# Patient Record
Sex: Female | Born: 1945
Health system: Southern US, Community
[De-identification: ages and names within clinical notes are randomized; demographics above are authoritative.]

## PROBLEM LIST (undated history)

## (undated) DIAGNOSIS — K219 Gastro-esophageal reflux disease without esophagitis: Secondary | ICD-10-CM

## (undated) DIAGNOSIS — F419 Anxiety disorder, unspecified: Secondary | ICD-10-CM

## (undated) DIAGNOSIS — I219 Acute myocardial infarction, unspecified: Secondary | ICD-10-CM

## (undated) DIAGNOSIS — H269 Unspecified cataract: Secondary | ICD-10-CM

## (undated) DIAGNOSIS — Z9289 Personal history of other medical treatment: Secondary | ICD-10-CM

## (undated) DIAGNOSIS — M1990 Unspecified osteoarthritis, unspecified site: Secondary | ICD-10-CM

## (undated) DIAGNOSIS — F329 Major depressive disorder, single episode, unspecified: Secondary | ICD-10-CM

## (undated) DIAGNOSIS — F32A Depression, unspecified: Secondary | ICD-10-CM

## (undated) DIAGNOSIS — E785 Hyperlipidemia, unspecified: Secondary | ICD-10-CM

## (undated) DIAGNOSIS — R079 Chest pain, unspecified: Secondary | ICD-10-CM

## (undated) DIAGNOSIS — D649 Anemia, unspecified: Secondary | ICD-10-CM

## (undated) DIAGNOSIS — I2510 Atherosclerotic heart disease of native coronary artery without angina pectoris: Secondary | ICD-10-CM

## (undated) HISTORY — DX: Hyperlipidemia, unspecified: E78.5

## (undated) HISTORY — DX: Gastro-esophageal reflux disease without esophagitis: K21.9

## (undated) HISTORY — PX: APPENDECTOMY: SHX54

## (undated) HISTORY — DX: Depression, unspecified: F32.A

## (undated) HISTORY — DX: Anxiety disorder, unspecified: F41.9

## (undated) HISTORY — DX: Unspecified osteoarthritis, unspecified site: M19.90

## (undated) HISTORY — DX: Chest pain, unspecified: R07.9

## (undated) HISTORY — DX: Major depressive disorder, single episode, unspecified: F32.9

## (undated) HISTORY — DX: Unspecified cataract: H26.9

## (undated) HISTORY — PX: CATARACT EXTRACTION, BILATERAL: SHX1313

## (undated) SURGICAL SUPPLY — 2 items
WATCHMAN FLX PRO PROCEDURE (KITS) ×1 IMPLANT
WATCHMAN TRUSTEER PROCEDURE (KITS) ×1 IMPLANT

---

## 1971-08-11 HISTORY — PX: VAGINAL HYSTERECTOMY: SUR661

## 2000-12-16 ENCOUNTER — Encounter: Payer: Self-pay | Admitting: Family Medicine

## 2000-12-16 ENCOUNTER — Encounter: Admission: RE | Admit: 2000-12-16 | Discharge: 2000-12-16 | Payer: Self-pay | Admitting: Family Medicine

## 2003-09-26 ENCOUNTER — Encounter: Admission: RE | Admit: 2003-09-26 | Discharge: 2003-09-26 | Payer: Self-pay | Admitting: Family Medicine

## 2004-09-26 ENCOUNTER — Encounter: Admission: RE | Admit: 2004-09-26 | Discharge: 2004-09-26 | Payer: Self-pay | Admitting: Family Medicine

## 2004-12-15 ENCOUNTER — Encounter: Admission: RE | Admit: 2004-12-15 | Discharge: 2004-12-15 | Payer: Self-pay | Admitting: Gastroenterology

## 2014-02-06 ENCOUNTER — Other Ambulatory Visit: Payer: Self-pay | Admitting: Family Medicine

## 2014-02-06 DIAGNOSIS — R1011 Right upper quadrant pain: Secondary | ICD-10-CM

## 2014-02-06 DIAGNOSIS — R110 Nausea: Secondary | ICD-10-CM

## 2014-02-12 ENCOUNTER — Ambulatory Visit
Admission: RE | Admit: 2014-02-12 | Discharge: 2014-02-12 | Disposition: A | Discharge status: Home / Self Care | Payer: Medicare Other | Source: Ambulatory Visit | Attending: Family Medicine | Admitting: Family Medicine

## 2014-02-12 DIAGNOSIS — R1011 Right upper quadrant pain: Secondary | ICD-10-CM

## 2014-02-12 DIAGNOSIS — R110 Nausea: Secondary | ICD-10-CM

## 2015-08-11 HISTORY — PX: COLONOSCOPY: SHX174

## 2015-10-25 ENCOUNTER — Ambulatory Visit (INDEPENDENT_AMBULATORY_CARE_PROVIDER_SITE_OTHER): Payer: Medicare Other | Admitting: Cardiology

## 2015-10-25 ENCOUNTER — Encounter: Payer: Self-pay | Admitting: Cardiology

## 2015-10-25 VITALS — BP 124/78 | HR 75 | Ht 63.0 in | Wt 134.0 lb

## 2015-10-25 DIAGNOSIS — R002 Palpitations: Secondary | ICD-10-CM

## 2015-10-25 DIAGNOSIS — R079 Chest pain, unspecified: Secondary | ICD-10-CM | POA: Diagnosis not present

## 2015-10-25 DIAGNOSIS — E785 Hyperlipidemia, unspecified: Secondary | ICD-10-CM | POA: Diagnosis not present

## 2015-10-25 HISTORY — DX: Hyperlipidemia, unspecified: E78.5

## 2015-10-25 NOTE — Progress Notes (Signed)
Cardiology Office Note   Date:  10/25/2015   ID:  Sarah Pacheco, DOB 11/04/1945, MRN [id number]  PCP:  No primary care provider on file.    Chief Complaint  Patient presents with  . Chest Pain      History of Present Illness: Sarah Pacheco is a 70 y.o. female who presents for evaluation of chest pain.  She has a history of hyperlipidemia (last LDL 257), depression and arthritis.  She saw her PCP and EKG showed NSR with no ST changes.  She is now referred for cardiac evaluation.  She says that the pain is a heaviness in the mid sternal area that is sporadic that typical lasts a few seconds and usually she continues her activity and it goes away.  She has had this about 3 times in the last few months.  It is usually nonexertional.  She says that she will feel like she needs to get a deeper breath when it occurs.  She denies any DOE and walks 5 miles daily without any chest pressure or SOB.  She occasionally will have pain in her arms but not associated with the pressure in her chest and usually movement makes the shoulder and arm pain worse.  She denies and nausea or diaphoresis with the pain.  She denies any LE edema or edema.  She occaisonally has some skipped heart beats.  These occur on a daily basis and she can feel it in her throat.  It feels like her heart is "rolling" and she has to clear her throat.      Past Medical History  Diagnosis Date  . Anxiety   . Depression   . Hyperlipidemia   . Chest pain   . Arthritis     History reviewed. No pertinent past surgical history.   Current Outpatient Prescriptions  Medication Sig Dispense Refill  . buPROPion (WELLBUTRIN SR) 150 MG 12 hr tablet Take 150 mg by mouth daily.    Marland Kitchen. estradiol (ESTRACE) 0.5 MG tablet Take 0.5 mg by mouth daily.    . fluticasone (FLONASE) 50 MCG/ACT nasal spray Place 1 spray into the nose as directed.    . meloxicam (MOBIC) 15 MG tablet Take 15 mg by mouth daily.    Marland Kitchen. PARoxetine  (PAXIL) 10 MG tablet Take 10 mg by mouth daily.    . simvastatin (ZOCOR) 40 MG tablet Take 40 mg by mouth daily.     No current facility-administered medications for this visit.    Allergies:   Atorvastatin; Codeine; Erythromycin base; and Penicillin g    Social History:  The patient  reports that she has quit smoking. She does not have any smokeless tobacco history on file. She reports that she does not drink alcohol or use illicit drugs.   Family History:  The patient's family history includes COPD in her brother; Heart attack in her sister; Heart attack (age of onset: 4659) in her brother; Heart attack (age of onset: 5072) in her father; Heart disease in her brother, father, sister, and sister; Heart failure in her brother; Lung cancer in her brother.    ROS:  Please see the history of present illness.   Otherwise, review of systems are positive for none.   All other systems are reviewed and negative.    PHYSICAL EXAM: VS:  BP 124/78 mmHg  Pulse 75  Ht 5\' 3"  (1.6 m)  Wt  134 lb (60.782 kg)  BMI 23.74 kg/m2 , BMI Body mass index is 23.74 kg/(m^2). GEN: Well nourished, well developed, in no acute distress HEENT: normal Neck: no JVD, carotid bruits, or masses Cardiac: RRR; no murmurs, rubs, or gallops,no edema  Respiratory:  clear to auscultation bilaterally, normal work of breathing GI: soft, nontender, nondistended, + BS MS: no deformity or atrophy Skin: warm and dry, no rash Neuro:  Strength and sensation are intact Psych: euthymic mood, full affect   EKG:  EKG is ordered today. The ekg ordered today demonstrates    Recent Labs: No results found for requested labs within last 365 days.    Lipid Panel No results found for: CHOL, TRIG, HDL, CHOLHDL, VLDL, LDLCALC, LDLDIRECT    Wt Readings from Last 3 Encounters:  10/25/15 134 lb (60.782 kg)       ASSESSMENT AND PLAN:  1.  Chest pain that is atypical and typical in presentation.  The pain is a pressure sensation but  only occurs with rest and only lasts a few seconds.  Her EKG is nonischemic.  Of concern, though, is that she has a very high cholesterol with a recent LDL of 257 and a very strong family history of CAD with her dad and several siblings with MI. I will get a nuclear stress test.   2.  Hyperlipidemia with severely elevated LDL.  Needs aggressive treatment given risk factors.  Continue Zocor. 3.  Palpitations.  I will get an event monitor to assess for arrythmias.    Current medicines are reviewed at length with the patient today.  The patient does not have concerns regarding medicines.  The following changes have been made:  no change  Labs/ tests ordered today: See above Assessment and Plan No orders of the defined types were placed in this encounter.     Disposition:   FU with me in 1 year Signed, Quintella Reichert, MD  10/25/2015 1:44 PM    [facility] [address] [place], [place], [place]  [address] Phone: [phone]; Fax: [phone]

## 2015-10-25 NOTE — Patient Instructions (Signed)
Medication Instructions:  Your physician recommends that you continue on your current medications as directed. Please refer to the Current Medication list given to you today.   Labwork: None  Testing/Procedures: Dr. Mayford Knifeurner recommends you have a NUCLEAR STRESS TEST.  Your physician has recommended that you wear an event monitor. Event monitors are medical devices that record the heart's electrical activity. Doctors most often us these monitors to diagnose arrhythmias. Arrhythmias are problems with the speed or rhythm of the heartbeat. The monitor is a small, portable device. You can wear one while you do your normal daily activities. This is usually used to diagnose what is causing palpitations/syncope (passing out).  Follow-Up: Your physician wants you to follow-up in: 1 year with Dr. Mayford Knifeurner. You will receive a reminder letter in the mail two months in advance. If you don't receive a letter, please call our office to schedule the follow-up appointment.   Any Other Special Instructions Will Be Listed Below (If Applicable).     If you need a refill on your cardiac medications before your next appointment, please call your pharmacy.

## 2015-11-05 ENCOUNTER — Encounter (HOSPITAL_COMMUNITY): Payer: Medicare Other

## 2018-07-11 ENCOUNTER — Encounter: Payer: Self-pay | Admitting: Gastroenterology

## 2018-07-15 ENCOUNTER — Encounter: Payer: Self-pay | Admitting: Gastroenterology

## 2018-07-15 ENCOUNTER — Ambulatory Visit: Payer: Medicare Other | Admitting: Gastroenterology

## 2018-07-15 VITALS — BP 144/88 | HR 68 | Ht 63.0 in | Wt 151.0 lb

## 2018-07-15 DIAGNOSIS — Z8619 Personal history of other infectious and parasitic diseases: Secondary | ICD-10-CM

## 2018-07-15 DIAGNOSIS — R1013 Epigastric pain: Secondary | ICD-10-CM | POA: Diagnosis not present

## 2018-07-15 NOTE — Progress Notes (Signed)
Chief Complaint: ?  Recurrent H. Pylori/abdominal pain  Referring Provider:  No ref. provider found      ASSESSMENT AND PLAN;   #1.  Epi pain- D/d PUD, GERD, gastritis, nonulcer dyspepsia, gastroparesis, musculoskeletal etiology, r/o gallbladder or pancreatic problems. #2.  History of H. pylori positive antibodies (do not have all the records).  Discussed regarding breath test, stool antigen test and EGD.  She would prefer EGD. #3.  FH colon cancer (sis and brother) - s/p colonoscopy x2 , last colonoscopy 2017- Dr Audrie LiaMann-advised to hold off due to difficult tortuous colon per patient.  Plan: - EGD with biopsies for further evaluation. - US complete - Please obtain previous records from [facility]. - If still with problems, consider HIDA scan with ejection fraction to rule out biliary dyskinesia. - Avoid all nonsteroidals for now. - D/w patient and patient's husband in detail. - If still with problems and the above work-up is negative, proceed with CT scan abdo/pelvis.    HPI:    Sarah Pacheco is a 72 y.o. female  Intermittent epigastric postprandial pain for last 6 months. Associated with nausea but no vomiting. In course of her work-up, she was found to be positive for H. pylori antibody.  She has been given 3 courses of H. pylori treatment.  This has included antibiotics-metronidazole, levofloxacin, clindamycin, omeprazole twice a day and then 3 times a day.  She did not bring any records along.  She continues to have postprandial epigastric discomfort.  She has also been on steroids for "respiratory problems".  Denies use of nonsteroidals except Advil's.  Has been having "queasy feeling" usually after eating.  Always had problems with constipation-normally will have bowel movements at the frequency of 1-2 times per week.  This is associated with some abdominal bloating.  Does not want another colonoscopy ever.  Has been drinking plenty of liquids.    Past Medical History:    Diagnosis Date  . Anxiety   . Arthritis   . Chest pain   . Depression   . Hyperlipidemia   . Hyperlipidemia with target LDL less than 70 10/25/2015    Past Surgical History:  Procedure Laterality Date  . COLONOSCOPY  2017   colon polyps    Family History  Problem Relation Age of Onset  . Heart attack Father 4172       MI  . Heart disease Father   . Heart disease Sister   . Heart attack Brother 59       MI  . Heart attack Sister   . Heart disease Sister   . Colon cancer Sister 1565  . Heart disease Brother   . Heart failure Brother   . Colon cancer Brother 5765  . COPD Brother   . Lung cancer Brother     Social History   Tobacco Use  . Smoking status: Former Games developermoker  . Smokeless tobacco: Never Used  Substance Use Topics  . Alcohol use: No  . Drug use: No    Current Outpatient Medications  Medication Sig Dispense Refill  . estradiol (ESTRACE) 0.5 MG tablet Take 0.5 mg by mouth daily.    Marland Kitchen. PARoxetine (PAXIL) 10 MG tablet Take 10 mg by mouth daily.    . simvastatin (ZOCOR) 40 MG tablet Take 40 mg by mouth daily.     No current facility-administered medications for this visit.     Allergies  Allergen Reactions  . Atorvastatin Other (See Comments)    Cramps in legs  .  Codeine Nausea And Vomiting    unknown  . Erythromycin Base Other (See Comments)    Sick on the stomach  . Penicillin G Hives    unknown    Review of Systems:  Constitutional: Denies fever, chills, diaphoresis, appetite change and fatigue.  HEENT: Denies photophobia, eye pain, redness, hearing loss, ear pain, congestion, sore throat, rhinorrhea, sneezing, mouth sores, neck pain, neck stiffness and tinnitus.   Respiratory: Denies SOB, DOE, cough, chest tightness,  and wheezing.   Cardiovascular: Denies chest pain, palpitations and leg swelling.  Genitourinary: Denies dysuria, urgency, frequency, hematuria, flank pain and difficulty urinating.  Musculoskeletal: Denies myalgias, back pain, joint  swelling, arthralgias and gait problem.  Skin: No rash.  Neurological: Denies dizziness, seizures, syncope, weakness, light-headedness, numbness and headaches.  Hematological: Denies adenopathy. Easy bruising, personal or family bleeding history  Psychiatric/Behavioral: Has anxiety or depression     Physical Exam:    BP (!) 144/88   Pulse 68   Ht 5\' 3"  (1.6 m)   Wt 151 lb (68.5 kg)   SpO2 98%   BMI 26.75 kg/m  Filed Weights   07/15/18 1505  Weight: 151 lb (68.5 kg)   Constitutional:  Well-developed, in no acute distress. Psychiatric: Normal mood and affect. Behavior is normal. HEENT: Pupils normal.  Conjunctivae are normal. No scleral icterus. Neck supple.  Cardiovascular: Normal rate, regular rhythm. No edema Pulmonary/chest: Effort normal and breath sounds normal. No wheezing, rales or rhonchi. Abdominal: Soft, nondistended. Nontender. Bowel sounds active throughout. There are no masses palpable. No hepatomegaly. Rectal:  defered Neurological: Alert and oriented to person place and time. Skin: Skin is warm and dry. No rashes noted.    Edman Circle, MD 07/15/2018, 3:12 PM  Cc: [facility].

## 2018-07-15 NOTE — Patient Instructions (Signed)
If you are age 70 or older, your body mass index should be between 23-30. Your Body mass index is 26.75 kg/m. If this is out of the aforementioned range listed, please consider follow up with your Primary Care Provider.  If you are age 70 or younger, your body mass index should be between 19-25. Your Body mass index is 26.75 kg/m. If this is out of the aformentioned range listed, please consider follow up with your Primary Care Provider.  You have been scheduled for an abdominal [facility] at [facility]  (1st floor ) on 07/21/18 at 11am. Please arrive 15 minutes prior to your appointment for registration. Make certain not to have anything to eat or drink 6 hours prior to your appointment. Should you need to reschedule your appointment, please contact radiology at [phone]. This test typically takes about 30 minutes to perform.   You have been scheduled for an endoscopy. Please follow written instructions given to you at your visit today. If you use inhalers (even only as needed), please bring them with you on the day of your procedure. Your physician has requested that you go to www.startemmi.com and enter the access code given to you at your visit today. This web site gives a general overview about your procedure. However, you should still follow specific instructions given to you by our office regarding your preparation for the procedure.  Thank you,  Dr. Lynann Bolognaajesh Gupta

## 2018-07-21 ENCOUNTER — Encounter (HOSPITAL_BASED_OUTPATIENT_CLINIC_OR_DEPARTMENT_OTHER): Payer: Self-pay | Admitting: Radiology

## 2018-07-21 ENCOUNTER — Ambulatory Visit (HOSPITAL_BASED_OUTPATIENT_CLINIC_OR_DEPARTMENT_OTHER)
Admission: RE | Admit: 2018-07-21 | Discharge: 2018-07-21 | Disposition: A | Discharge status: Home / Self Care | Payer: Medicare Other | Source: Ambulatory Visit | Attending: Gastroenterology | Admitting: Gastroenterology

## 2018-07-21 DIAGNOSIS — R1013 Epigastric pain: Secondary | ICD-10-CM | POA: Insufficient documentation

## 2018-07-21 DIAGNOSIS — Z8619 Personal history of other infectious and parasitic diseases: Secondary | ICD-10-CM | POA: Insufficient documentation

## 2018-07-21 DIAGNOSIS — N281 Cyst of kidney, acquired: Secondary | ICD-10-CM | POA: Diagnosis not present

## 2018-08-02 ENCOUNTER — Encounter: Payer: Self-pay | Admitting: Gastroenterology

## 2018-08-16 ENCOUNTER — Encounter: Payer: Self-pay | Admitting: Gastroenterology

## 2018-08-16 ENCOUNTER — Ambulatory Visit (AMBULATORY_SURGERY_CENTER): Payer: Medicare Other | Admitting: Gastroenterology

## 2018-08-16 ENCOUNTER — Other Ambulatory Visit: Payer: Self-pay

## 2018-08-16 VITALS — BP 102/51 | HR 59 | Temp 96.6°F | Resp 13 | Ht 63.0 in | Wt 151.0 lb

## 2018-08-16 DIAGNOSIS — K2270 Barrett's esophagus without dysplasia: Secondary | ICD-10-CM | POA: Diagnosis not present

## 2018-08-16 DIAGNOSIS — K2970 Gastritis, unspecified, without bleeding: Secondary | ICD-10-CM | POA: Diagnosis not present

## 2018-08-16 DIAGNOSIS — R1013 Epigastric pain: Secondary | ICD-10-CM

## 2018-08-16 MED ORDER — SODIUM CHLORIDE 0.9 % IV SOLN: 500.0000 mL | Freq: Once | INTRAVENOUS | Status: DC

## 2018-08-16 MED ORDER — PANTOPRAZOLE SODIUM 40 MG PO TBEC: 40.0000 mg | DELAYED_RELEASE_TABLET | Freq: Every day | ORAL | 6 refills | Status: AC

## 2018-08-16 NOTE — Patient Instructions (Signed)
YOU HAD AN ENDOSCOPIC PROCEDURE TODAY AT THE [facility]:   Refer to the procedure report that was given to you for any specific questions about what was found during the examination.  If the procedure report does not answer your questions, please call your gastroenterologist to clarify.  If you requested that your care partner not be given the details of your procedure findings, then the procedure report has been included in a sealed envelope for you to review at your convenience later.  YOU SHOULD EXPECT: Some feelings of bloating in the abdomen. Passage of more gas than usual.  Walking can help get rid of the air that was put into your GI tract during the procedure and reduce the bloating.   Please Note:  You might notice some irritation and congestion in your nose or some drainage.  This is from the oxygen used during your procedure.  There is no need for concern and it should clear up in a day or so.  SYMPTOMS TO REPORT IMMEDIATELY:    Following upper endoscopy (EGD)  Vomiting of blood or coffee ground material  New chest pain or pain under the shoulder blades  Painful or persistently difficult swallowing  New shortness of breath  Fever of 100F or higher  Black, tarry-looking stools  For urgent or emergent issues, a gastroenterologist can be reached at any hour by calling [phone].   DIET:  We do recommend a small meal at first, but then you may proceed to your regular diet.  Drink plenty of fluids but you should avoid alcoholic beverages for 24 hours.  ACTIVITY:  You should plan to take it easy for the rest of today and you should NOT DRIVE or use heavy machinery until tomorrow (because of the sedation medicines used during the test).    FOLLOW UP: Our staff will call the number listed on your records the next business day following your procedure to check on you and address any questions or concerns that you may have regarding the information given to you  following your procedure. If we do not reach you, we will leave a message.  However, if you are feeling well and you are not experiencing any problems, there is no need to return our call.  We will assume that you have returned to your regular daily activities without incident.  If any biopsies were taken you will be contacted by phone or by letter within the next 1-3 weeks.  Please call us at [phone] if you have not heard about the biopsies in 3 weeks.    SIGNATURES/CONFIDENTIALITY: You and/or your care partner have signed paperwork which will be entered into your electronic medical record.  These signatures attest to the fact that that the information above on your After Visit Summary has been reviewed and is understood.  Full responsibility of the confidentiality of this discharge information lies with you and/or your care-partner.  Read all of the handouts given to you by your recovery room nure.

## 2018-08-16 NOTE — Progress Notes (Signed)
Patient informed of warning of increased risk of GI bleed displayed for taking  Mobic and Paxil.

## 2018-08-16 NOTE — Op Note (Signed)
[facility] Patient Name: Sarah Pacheco Procedure Date: 08/16/2018 9:05 AM MRN: [id number] Endoscopist: Lynann Bologna , MD Age: 73 Referring MD:  Date of Birth: 04-30-1946 Gender: Female Account #: [id number] Procedure:                Upper GI endoscopy Indications:              Epigastric abdominal pain. H/O recurrent H. pylori. Medicines:                Monitored Anesthesia Care Procedure:                Pre-Anesthesia Assessment:                           - Prior to the procedure, a History and Physical                            was performed, and patient medications and                            allergies were reviewed. The patient's tolerance of                            previous anesthesia was also reviewed. The risks                            and benefits of the procedure and the sedation                            options and risks were discussed with the patient.                            All questions were answered, and informed consent                            was obtained. Prior Anticoagulants: The patient has                            taken no previous anticoagulant or antiplatelet                            agents. ASA Grade Assessment: II - A patient with                            mild systemic disease. After reviewing the risks                            and benefits, the patient was deemed in                            satisfactory condition to undergo the procedure.                           After obtaining informed consent, the endoscope was  passed under direct vision. Throughout the                            procedure, the patient's blood pressure, pulse, and                            oxygen saturations were monitored continuously. The                            Model GIF-HQ190 [id number] scope was introduced                            through the and advanced to the. The Model                            [id number]-HQ190  [id number]) scope was introduced through                            the and advanced to the second part of duodenum.                            The upper GI endoscopy was accomplished without                            difficulty. The patient tolerated the procedure                            well. Scope In: Scope Out: Findings:                 The Z-line was irregular and was found 35 cm from                            the incisors. Healed distal esophageal erosions.                            Biopsies were taken with a cold forceps for                            histology. Estimated blood loss: none.                           Localized mild inflammation characterized by                            erythema was found in the gastric antrum. Biopsies                            were taken with a cold forceps for histology.                            Estimated blood loss: none.                           The examined duodenum was normal.  Biopsies for                            histology were taken with a cold forceps for                            evaluation of celiac disease. Estimated blood loss:                            none. Complications:            No immediate complications. Estimated Blood Loss:     Estimated blood loss: none. Impression:               - Z-line irregular, 35 cm from the incisors.                            Biopsied.                           - Mild Gastritis. Biopsied.                           - Otherwise normal EGD. Recommendation:           - Patient has a contact number available for                            emergencies. The signs and symptoms of potential                            delayed complications were discussed with the                            patient. Return to normal activities tomorrow.                            Written discharge instructions were provided to the                            patient.                           - Resume previous  diet.                           - Continue present medications.                           - Change omeprazole to Protonix 40 mg p.o. once a                            day #30, 6 refills.                           - Await pathology results.                           -  Follow-up in the GI clinic in 12 weeks. Lynann Bolognaajesh Dabid [place], MD 08/16/2018 9:28:27 AM This report has been signed electronically.

## 2018-08-16 NOTE — Progress Notes (Signed)
PT taken to PACU. Monitors in place. VSS. Report given to RN. 

## 2018-08-16 NOTE — Progress Notes (Signed)
Called to room to assist during endoscopic procedure.  Patient ID and intended procedure confirmed with present staff. Received instructions for my participation in the procedure from the performing physician.  

## 2018-08-17 ENCOUNTER — Telehealth: Payer: Self-pay

## 2018-08-17 NOTE — Telephone Encounter (Signed)
  Follow up Call-  Call back number 08/16/2018  Post procedure Call Back phone  # [phone]  Permission to leave phone message Yes  Some recent data might be hidden     Patient questions:  Do you have a fever, pain , or abdominal swelling? No. Pain Score  0 *  Have you tolerated food without any problems? Yes.    Have you been able to return to your normal activities? Yes.    Do you have any questions about your discharge instructions: Diet   No. Medications  No. Follow up visit  No.  Do you have questions or concerns about your Care? No.  Actions: * If pain score is 4 or above: No action needed, pain <4.

## 2018-08-24 ENCOUNTER — Encounter: Payer: Self-pay | Admitting: Gastroenterology

## 2018-10-10 ENCOUNTER — Emergency Department (HOSPITAL_COMMUNITY): Payer: Medicare Other

## 2018-10-10 ENCOUNTER — Inpatient Hospital Stay (HOSPITAL_COMMUNITY): Payer: Medicare Other | Admitting: Anesthesiology

## 2018-10-10 ENCOUNTER — Inpatient Hospital Stay (HOSPITAL_COMMUNITY)
Admission: EM | Admit: 2018-10-10 | Discharge: 2018-10-15 | DRG: 331 | Disposition: A | Discharge status: Home / Self Care | Payer: Medicare Other | Attending: General Surgery | Admitting: General Surgery

## 2018-10-10 ENCOUNTER — Encounter (HOSPITAL_COMMUNITY): Payer: Self-pay | Admitting: Radiology

## 2018-10-10 ENCOUNTER — Other Ambulatory Visit: Payer: Self-pay

## 2018-10-10 ENCOUNTER — Encounter (HOSPITAL_COMMUNITY): Admission: EM | Disposition: A | Discharge status: Home / Self Care | Payer: Self-pay | Source: Home / Self Care

## 2018-10-10 DIAGNOSIS — Z8719 Personal history of other diseases of the digestive system: Secondary | ICD-10-CM | POA: Diagnosis not present

## 2018-10-10 DIAGNOSIS — Z90710 Acquired absence of both cervix and uterus: Secondary | ICD-10-CM

## 2018-10-10 DIAGNOSIS — Z800 Family history of malignant neoplasm of digestive organs: Secondary | ICD-10-CM | POA: Diagnosis not present

## 2018-10-10 DIAGNOSIS — Z87891 Personal history of nicotine dependence: Secondary | ICD-10-CM | POA: Diagnosis not present

## 2018-10-10 DIAGNOSIS — R030 Elevated blood-pressure reading, without diagnosis of hypertension: Secondary | ICD-10-CM | POA: Diagnosis present

## 2018-10-10 DIAGNOSIS — K567 Ileus, unspecified: Secondary | ICD-10-CM | POA: Diagnosis present

## 2018-10-10 DIAGNOSIS — R110 Nausea: Secondary | ICD-10-CM | POA: Diagnosis not present

## 2018-10-10 DIAGNOSIS — Z883 Allergy status to other anti-infective agents status: Secondary | ICD-10-CM | POA: Diagnosis not present

## 2018-10-10 DIAGNOSIS — Z880 Allergy status to penicillin: Secondary | ICD-10-CM | POA: Diagnosis not present

## 2018-10-10 DIAGNOSIS — F419 Anxiety disorder, unspecified: Secondary | ICD-10-CM | POA: Diagnosis present

## 2018-10-10 DIAGNOSIS — K219 Gastro-esophageal reflux disease without esophagitis: Secondary | ICD-10-CM | POA: Diagnosis present

## 2018-10-10 DIAGNOSIS — Z8249 Family history of ischemic heart disease and other diseases of the circulatory system: Secondary | ICD-10-CM | POA: Diagnosis not present

## 2018-10-10 DIAGNOSIS — E785 Hyperlipidemia, unspecified: Secondary | ICD-10-CM | POA: Diagnosis present

## 2018-10-10 DIAGNOSIS — Z79899 Other long term (current) drug therapy: Secondary | ICD-10-CM | POA: Diagnosis not present

## 2018-10-10 DIAGNOSIS — Z885 Allergy status to narcotic agent status: Secondary | ICD-10-CM | POA: Diagnosis not present

## 2018-10-10 DIAGNOSIS — Z801 Family history of malignant neoplasm of trachea, bronchus and lung: Secondary | ICD-10-CM | POA: Diagnosis not present

## 2018-10-10 DIAGNOSIS — K562 Volvulus: Secondary | ICD-10-CM | POA: Diagnosis present

## 2018-10-10 DIAGNOSIS — Z825 Family history of asthma and other chronic lower respiratory diseases: Secondary | ICD-10-CM

## 2018-10-10 DIAGNOSIS — Z8371 Family history of colonic polyps: Secondary | ICD-10-CM

## 2018-10-10 HISTORY — PX: LAPAROTOMY: SHX154

## 2018-10-10 LAB — COMPREHENSIVE METABOLIC PANEL
ALT: 12 U/L (ref 0–44)
AST: 19 U/L (ref 15–41)
Albumin: 4.5 g/dL (ref 3.5–5.0)
Alkaline Phosphatase: 44 U/L (ref 38–126)
Anion gap: 8 (ref 5–15)
BILIRUBIN TOTAL: 0.4 mg/dL (ref 0.3–1.2)
BUN: 10 mg/dL (ref 8–23)
CO2: 23 mmol/L (ref 22–32)
Calcium: 9 mg/dL (ref 8.9–10.3)
Chloride: 107 mmol/L (ref 98–111)
Creatinine, Ser: 0.92 mg/dL (ref 0.44–1.00)
Glucose, Bld: 124 mg/dL — ABNORMAL HIGH (ref 70–99)
POTASSIUM: 4.3 mmol/L (ref 3.5–5.1)
Sodium: 138 mmol/L (ref 135–145)
TOTAL PROTEIN: 7.6 g/dL (ref 6.5–8.1)

## 2018-10-10 LAB — URINALYSIS, ROUTINE W REFLEX MICROSCOPIC
Bilirubin Urine: NEGATIVE
GLUCOSE, UA: NEGATIVE mg/dL
Ketones, ur: 20 mg/dL — AB
Leukocytes,Ua: NEGATIVE
NITRITE: NEGATIVE
PH: 5 (ref 5.0–8.0)
PROTEIN: 30 mg/dL — AB
Specific Gravity, Urine: 1.026 (ref 1.005–1.030)

## 2018-10-10 LAB — TYPE AND SCREEN
ABO/RH(D): A NEG
Antibody Screen: NEGATIVE

## 2018-10-10 LAB — CBC WITH DIFFERENTIAL/PLATELET
Abs Immature Granulocytes: 0.02 10*3/uL (ref 0.00–0.07)
BASOS ABS: 0.1 10*3/uL (ref 0.0–0.1)
Basophils Relative: 1 %
Eosinophils Absolute: 0 10*3/uL (ref 0.0–0.5)
Eosinophils Relative: 0 %
HCT: 41.4 % (ref 36.0–46.0)
Hemoglobin: 12.8 g/dL (ref 12.0–15.0)
Immature Granulocytes: 0 %
LYMPHS PCT: 21 %
Lymphs Abs: 1.8 10*3/uL (ref 0.7–4.0)
MCH: 29.5 pg (ref 26.0–34.0)
MCHC: 30.9 g/dL (ref 30.0–36.0)
MCV: 95.4 fL (ref 80.0–100.0)
Monocytes Absolute: 0.6 10*3/uL (ref 0.1–1.0)
Monocytes Relative: 7 %
NEUTROS ABS: 6 10*3/uL (ref 1.7–7.7)
NRBC: 0 % (ref 0.0–0.2)
Neutrophils Relative %: 71 %
Platelets: 296 10*3/uL (ref 150–400)
RBC: 4.34 MIL/uL (ref 3.87–5.11)
RDW: 13.9 % (ref 11.5–15.5)
WBC: 8.5 10*3/uL (ref 4.0–10.5)

## 2018-10-10 LAB — LIPASE, BLOOD: LIPASE: 59 U/L — AB (ref 11–51)

## 2018-10-10 LAB — ABO/RH: ABO/RH(D): A NEG

## 2018-10-10 LAB — MRSA PCR SCREENING: MRSA by PCR: NEGATIVE

## 2018-10-10 LAB — I-STAT TROPONIN, ED: Troponin i, poc: 0.05 ng/mL (ref 0.00–0.08)

## 2018-10-10 SURGERY — LAPAROTOMY, EXPLORATORY
Anesthesia: General

## 2018-10-10 MED ORDER — PANTOPRAZOLE SODIUM 40 MG PO TBEC
40.0000 mg | DELAYED_RELEASE_TABLET | Freq: Every day | ORAL | Status: DC
  Administered 2018-10-10 – 2018-10-13 (×4): 40 mg via ORAL
  Filled 2018-10-10 (×5): qty 1

## 2018-10-10 MED ORDER — HYDRALAZINE HCL 20 MG/ML IJ SOLN: 10.0000 mg | INTRAMUSCULAR | Status: DC | PRN

## 2018-10-10 MED ORDER — EPHEDRINE SULFATE-NACL 50-0.9 MG/10ML-% IV SOSY
PREFILLED_SYRINGE | INTRAVENOUS | Status: DC | PRN
  Administered 2018-10-10 (×3): 5 mg via INTRAVENOUS

## 2018-10-10 MED ORDER — METOCLOPRAMIDE HCL 5 MG/ML IJ SOLN: 10.0000 mg | Freq: Once | INTRAMUSCULAR | Status: DC | PRN

## 2018-10-10 MED ORDER — CHLORHEXIDINE GLUCONATE CLOTH 2 % EX PADS: 6.0000 | MEDICATED_PAD | Freq: Once | CUTANEOUS | Status: DC

## 2018-10-10 MED ORDER — DEXAMETHASONE SODIUM PHOSPHATE 10 MG/ML IJ SOLN
INTRAMUSCULAR | Status: AC
  Filled 2018-10-10: qty 1

## 2018-10-10 MED ORDER — KETAMINE HCL 10 MG/ML IJ SOLN
INTRAMUSCULAR | Status: AC
  Filled 2018-10-10: qty 1

## 2018-10-10 MED ORDER — MORPHINE SULFATE (PF) 4 MG/ML IV SOLN
2.0000 mg | INTRAVENOUS | Status: DC | PRN
  Administered 2018-10-10: 2 mg via INTRAVENOUS
  Filled 2018-10-10: qty 1

## 2018-10-10 MED ORDER — FENTANYL CITRATE (PF) 100 MCG/2ML IJ SOLN
INTRAMUSCULAR | Status: AC
  Filled 2018-10-10: qty 2

## 2018-10-10 MED ORDER — SODIUM CHLORIDE 0.9 % IV SOLN: INTRAVENOUS | Status: DC

## 2018-10-10 MED ORDER — HYDROMORPHONE HCL 1 MG/ML IJ SOLN
0.2500 mg | INTRAMUSCULAR | Status: DC | PRN
  Administered 2018-10-10 (×2): 0.5 mg via INTRAVENOUS

## 2018-10-10 MED ORDER — SUGAMMADEX SODIUM 200 MG/2ML IV SOLN
INTRAVENOUS | Status: AC
  Filled 2018-10-10: qty 2

## 2018-10-10 MED ORDER — GENTAMICIN SULFATE 40 MG/ML IJ SOLN
5.0000 mg/kg | Freq: Once | INTRAVENOUS | Status: AC
  Administered 2018-10-10: 290 mg via INTRAVENOUS
  Filled 2018-10-10: qty 7.25

## 2018-10-10 MED ORDER — SUCCINYLCHOLINE CHLORIDE 200 MG/10ML IV SOSY
PREFILLED_SYRINGE | INTRAVENOUS | Status: DC | PRN
  Administered 2018-10-10: 120 mg via INTRAVENOUS

## 2018-10-10 MED ORDER — EPHEDRINE 5 MG/ML INJ
INTRAVENOUS | Status: AC
  Filled 2018-10-10: qty 10

## 2018-10-10 MED ORDER — KETAMINE HCL 10 MG/ML IJ SOLN
INTRAMUSCULAR | Status: DC | PRN
  Administered 2018-10-10: 25 mg via INTRAVENOUS

## 2018-10-10 MED ORDER — FENTANYL CITRATE (PF) 100 MCG/2ML IJ SOLN
25.0000 ug | INTRAMUSCULAR | Status: DC | PRN
  Administered 2018-10-10 (×3): 50 ug via INTRAVENOUS

## 2018-10-10 MED ORDER — LACTATED RINGERS IV SOLN
INTRAVENOUS | Status: DC
  Administered 2018-10-10 (×2): via INTRAVENOUS

## 2018-10-10 MED ORDER — DEXAMETHASONE SODIUM PHOSPHATE 10 MG/ML IJ SOLN
INTRAMUSCULAR | Status: DC | PRN
  Administered 2018-10-10: 8 mg via INTRAVENOUS

## 2018-10-10 MED ORDER — SODIUM CHLORIDE 0.9 % IV SOLN: 2.0000 g | Freq: Two times a day (BID) | INTRAVENOUS | Status: DC

## 2018-10-10 MED ORDER — SODIUM CHLORIDE (PF) 0.9 % IJ SOLN
INTRAMUSCULAR | Status: AC
  Filled 2018-10-10: qty 50

## 2018-10-10 MED ORDER — ONDANSETRON HCL 4 MG/2ML IJ SOLN
INTRAMUSCULAR | Status: DC | PRN
  Administered 2018-10-10: 4 mg via INTRAVENOUS

## 2018-10-10 MED ORDER — SIMETHICONE 80 MG PO CHEW
40.0000 mg | CHEWABLE_TABLET | Freq: Four times a day (QID) | ORAL | Status: DC | PRN
  Filled 2018-10-10: qty 1

## 2018-10-10 MED ORDER — PROPOFOL 10 MG/ML IV BOLUS
INTRAVENOUS | Status: DC | PRN
  Administered 2018-10-10: 120 mg via INTRAVENOUS

## 2018-10-10 MED ORDER — DIPHENHYDRAMINE HCL 50 MG/ML IJ SOLN: 12.5000 mg | Freq: Four times a day (QID) | INTRAMUSCULAR | Status: DC | PRN

## 2018-10-10 MED ORDER — METHOCARBAMOL 500 MG PO TABS: 500.0000 mg | ORAL_TABLET | Freq: Four times a day (QID) | ORAL | Status: DC | PRN

## 2018-10-10 MED ORDER — HYDROMORPHONE HCL 1 MG/ML IJ SOLN
INTRAMUSCULAR | Status: AC
  Filled 2018-10-10: qty 1

## 2018-10-10 MED ORDER — ACETAMINOPHEN 325 MG PO TABS: 650.0000 mg | ORAL_TABLET | Freq: Four times a day (QID) | ORAL | Status: DC | PRN

## 2018-10-10 MED ORDER — SIMVASTATIN 40 MG PO TABS: 40.0000 mg | ORAL_TABLET | Freq: Every day | ORAL | Status: DC

## 2018-10-10 MED ORDER — ACETAMINOPHEN 650 MG RE SUPP: 650.0000 mg | Freq: Four times a day (QID) | RECTAL | Status: DC | PRN

## 2018-10-10 MED ORDER — FENTANYL CITRATE (PF) 100 MCG/2ML IJ SOLN
100.0000 ug | Freq: Once | INTRAMUSCULAR | Status: AC
  Administered 2018-10-10: 100 ug via INTRAVENOUS
  Filled 2018-10-10: qty 2

## 2018-10-10 MED ORDER — IOPAMIDOL (ISOVUE-300) INJECTION 61%
INTRAVENOUS | Status: AC
  Filled 2018-10-10: qty 100

## 2018-10-10 MED ORDER — ONDANSETRON 4 MG PO TBDP: 4.0000 mg | ORAL_TABLET | Freq: Four times a day (QID) | ORAL | Status: DC | PRN

## 2018-10-10 MED ORDER — ONDANSETRON HCL 4 MG/2ML IJ SOLN
4.0000 mg | Freq: Four times a day (QID) | INTRAMUSCULAR | Status: DC | PRN
  Administered 2018-10-11 – 2018-10-14 (×6): 4 mg via INTRAVENOUS
  Filled 2018-10-10 (×6): qty 2

## 2018-10-10 MED ORDER — MEPERIDINE HCL 50 MG/ML IJ SOLN: 6.2500 mg | INTRAMUSCULAR | Status: DC | PRN

## 2018-10-10 MED ORDER — LIDOCAINE 2% (20 MG/ML) 5 ML SYRINGE
INTRAMUSCULAR | Status: AC
  Filled 2018-10-10: qty 5

## 2018-10-10 MED ORDER — OXYCODONE HCL 5 MG PO TABS
5.0000 mg | ORAL_TABLET | ORAL | Status: DC | PRN
  Administered 2018-10-10 – 2018-10-12 (×4): 5 mg via ORAL
  Administered 2018-10-14: 10 mg via ORAL
  Filled 2018-10-10: qty 2
  Filled 2018-10-10 (×4): qty 1

## 2018-10-10 MED ORDER — CLINDAMYCIN PHOSPHATE 900 MG/50ML IV SOLN
900.0000 mg | Freq: Once | INTRAVENOUS | Status: AC
  Administered 2018-10-10: 900 mg via INTRAVENOUS
  Filled 2018-10-10: qty 50

## 2018-10-10 MED ORDER — PAROXETINE HCL 20 MG PO TABS: 20.0000 mg | ORAL_TABLET | Freq: Every day | ORAL | Status: DC

## 2018-10-10 MED ORDER — DIPHENHYDRAMINE HCL 12.5 MG/5ML PO ELIX: 12.5000 mg | ORAL_SOLUTION | Freq: Four times a day (QID) | ORAL | Status: DC | PRN

## 2018-10-10 MED ORDER — SUGAMMADEX SODIUM 200 MG/2ML IV SOLN
INTRAVENOUS | Status: DC | PRN
  Administered 2018-10-10: 140 mg via INTRAVENOUS

## 2018-10-10 MED ORDER — KETOROLAC TROMETHAMINE 15 MG/ML IJ SOLN
15.0000 mg | Freq: Four times a day (QID) | INTRAMUSCULAR | Status: DC | PRN
  Administered 2018-10-10 – 2018-10-14 (×5): 15 mg via INTRAVENOUS
  Filled 2018-10-10 (×5): qty 1

## 2018-10-10 MED ORDER — SODIUM CHLORIDE 0.9 % IV BOLUS
1000.0000 mL | Freq: Once | INTRAVENOUS | Status: AC
  Administered 2018-10-10: 1000 mL via INTRAVENOUS

## 2018-10-10 MED ORDER — ACETAMINOPHEN 500 MG PO TABS
1000.0000 mg | ORAL_TABLET | Freq: Four times a day (QID) | ORAL | Status: DC
  Administered 2018-10-10 – 2018-10-15 (×15): 1000 mg via ORAL
  Filled 2018-10-10 (×18): qty 2

## 2018-10-10 MED ORDER — ENOXAPARIN SODIUM 40 MG/0.4ML ~~LOC~~ SOLN
40.0000 mg | SUBCUTANEOUS | Status: DC
  Administered 2018-10-11 – 2018-10-15 (×5): 40 mg via SUBCUTANEOUS
  Filled 2018-10-10 (×5): qty 0.4

## 2018-10-10 MED ORDER — LIDOCAINE 2% (20 MG/ML) 5 ML SYRINGE
INTRAMUSCULAR | Status: DC | PRN
  Administered 2018-10-10: 60 mg via INTRAVENOUS

## 2018-10-10 MED ORDER — IOPAMIDOL (ISOVUE-300) INJECTION 61%
100.0000 mL | Freq: Once | INTRAVENOUS | Status: AC | PRN
  Administered 2018-10-10: 100 mL via INTRAVENOUS

## 2018-10-10 MED ORDER — ONDANSETRON HCL 4 MG PO TABS: 4.0000 mg | ORAL_TABLET | Freq: Four times a day (QID) | ORAL | Status: DC | PRN

## 2018-10-10 MED ORDER — ESTRADIOL 1 MG PO TABS
0.5000 mg | ORAL_TABLET | Freq: Every day | ORAL | Status: DC
  Filled 2018-10-10: qty 0.5

## 2018-10-10 MED ORDER — FENTANYL CITRATE (PF) 100 MCG/2ML IJ SOLN: 12.5000 ug | INTRAMUSCULAR | Status: DC | PRN

## 2018-10-10 MED ORDER — PAROXETINE HCL 20 MG PO TABS
20.0000 mg | ORAL_TABLET | Freq: Every day | ORAL | Status: DC
  Administered 2018-10-11 – 2018-10-13 (×3): 20 mg via ORAL
  Filled 2018-10-10 (×4): qty 1

## 2018-10-10 MED ORDER — ONDANSETRON HCL 4 MG/2ML IJ SOLN: 4.0000 mg | Freq: Four times a day (QID) | INTRAMUSCULAR | Status: DC | PRN

## 2018-10-10 MED ORDER — ROCURONIUM BROMIDE 10 MG/ML (PF) SYRINGE
PREFILLED_SYRINGE | INTRAVENOUS | Status: DC | PRN
  Administered 2018-10-10: 30 mg via INTRAVENOUS

## 2018-10-10 MED ORDER — ONDANSETRON HCL 4 MG/2ML IJ SOLN
INTRAMUSCULAR | Status: AC
  Filled 2018-10-10: qty 2

## 2018-10-10 MED ORDER — ROCURONIUM BROMIDE 100 MG/10ML IV SOLN
INTRAVENOUS | Status: AC
  Filled 2018-10-10: qty 1

## 2018-10-10 MED ORDER — GENTAMICIN IN SALINE 1-0.9 MG/ML-% IV SOLN: 100.0000 mg | Freq: Once | INTRAVENOUS | Status: DC

## 2018-10-10 MED ORDER — PANTOPRAZOLE SODIUM 40 MG IV SOLR: 40.0000 mg | Freq: Every day | INTRAVENOUS | Status: DC

## 2018-10-10 MED ORDER — PHENTERMINE HCL 37.5 MG PO TABS: 37.5000 mg | ORAL_TABLET | Freq: Every day | ORAL | Status: DC

## 2018-10-10 MED ORDER — PROPOFOL 10 MG/ML IV BOLUS
INTRAVENOUS | Status: AC
  Filled 2018-10-10: qty 20

## 2018-10-10 MED ORDER — FENTANYL CITRATE (PF) 250 MCG/5ML IJ SOLN
INTRAMUSCULAR | Status: DC | PRN
  Administered 2018-10-10: 25 ug via INTRAVENOUS
  Administered 2018-10-10: 50 ug via INTRAVENOUS
  Administered 2018-10-10: 25 ug via INTRAVENOUS
  Administered 2018-10-10 (×2): 50 ug via INTRAVENOUS

## 2018-10-10 MED ORDER — GABAPENTIN 100 MG PO CAPS
100.0000 mg | ORAL_CAPSULE | Freq: Every day | ORAL | Status: DC
  Administered 2018-10-10 – 2018-10-14 (×5): 100 mg via ORAL
  Filled 2018-10-10 (×5): qty 1

## 2018-10-10 SURGICAL SUPPLY — 40 items
BLADE EXTENDED COATED 6.5IN (ELECTRODE) IMPLANT
CHLORAPREP W/TINT 26ML (MISCELLANEOUS) ×2 IMPLANT
COVER MAYO STAND STRL (DRAPES) ×6 IMPLANT
COVER SURGICAL LIGHT HANDLE (MISCELLANEOUS) ×2 IMPLANT
COVER WAND RF STERILE (DRAPES) IMPLANT
DRAPE LAPAROSCOPIC ABDOMINAL (DRAPES) ×2 IMPLANT
DRSG OPSITE POSTOP 4X10 (GAUZE/BANDAGES/DRESSINGS) IMPLANT
DRSG OPSITE POSTOP 4X6 (GAUZE/BANDAGES/DRESSINGS) IMPLANT
DRSG OPSITE POSTOP 4X8 (GAUZE/BANDAGES/DRESSINGS) ×2 IMPLANT
ELECT PENCIL ROCKER SW 15FT (MISCELLANEOUS) ×4 IMPLANT
ELECT REM PT RETURN 15FT ADLT (MISCELLANEOUS) ×2 IMPLANT
GAUZE SPONGE 4X4 12PLY STRL (GAUZE/BANDAGES/DRESSINGS) IMPLANT
GLOVE BIO SURGEON STRL SZ7 (GLOVE) ×4 IMPLANT
GLOVE BIOGEL PI IND STRL 7.5 (GLOVE) ×2 IMPLANT
GLOVE BIOGEL PI INDICATOR 7.5 (GLOVE) ×2
GOWN STRL REUS W/TWL LRG LVL3 (GOWN DISPOSABLE) ×4 IMPLANT
GOWN STRL REUS W/TWL XL LVL3 (GOWN DISPOSABLE) ×8 IMPLANT
HANDLE SUCTION POOLE (INSTRUMENTS) IMPLANT
LIGASURE IMPACT 36 18CM CVD LR (INSTRUMENTS) ×2 IMPLANT
PACK COLON (CUSTOM PROCEDURE TRAY) ×2 IMPLANT
RELOAD PROXIMATE 75MM BLUE (ENDOMECHANICALS) ×2 IMPLANT
SPONGE LAP 18X18 RF (DISPOSABLE) IMPLANT
STAPLER GUN LINEAR PROX 60 (STAPLE) ×2 IMPLANT
STAPLER PROXIMATE 75MM BLUE (STAPLE) ×2 IMPLANT
STAPLER VISISTAT 35W (STAPLE) ×4 IMPLANT
SUCTION POOLE HANDLE (INSTRUMENTS)
SUT NOVA NAB GS-21 0 18 T12 DT (SUTURE) IMPLANT
SUT NOVA NAB GS-21 1 T12 (SUTURE) IMPLANT
SUT PDS AB 1 CTX 36 (SUTURE) IMPLANT
SUT PDS AB 1 TP1 96 (SUTURE) ×2 IMPLANT
SUT SILK 2 0 (SUTURE) ×1
SUT SILK 2 0 SH CR/8 (SUTURE) ×2 IMPLANT
SUT SILK 2-0 18XBRD TIE 12 (SUTURE) ×1 IMPLANT
SUT SILK 3 0 (SUTURE) ×1
SUT SILK 3 0 SH CR/8 (SUTURE) ×4 IMPLANT
SUT SILK 3-0 18XBRD TIE 12 (SUTURE) ×1 IMPLANT
TOWEL OR 17X26 10 PK STRL BLUE (TOWEL DISPOSABLE) IMPLANT
TOWEL OR NON WOVEN STRL DISP B (DISPOSABLE) ×2 IMPLANT
TRAY FOLEY MTR SLVR 14FR STAT (SET/KITS/TRAYS/PACK) IMPLANT
TRAY FOLEY MTR SLVR 16FR STAT (SET/KITS/TRAYS/PACK) IMPLANT

## 2018-10-10 NOTE — ED Notes (Signed)
Failed attempt to collect labs   

## 2018-10-10 NOTE — ED Notes (Signed)
Main lab phlebotomy to draw I-stat troponin

## 2018-10-10 NOTE — Transfer of Care (Signed)
Immediate Anesthesia Transfer of Care Note  Patient: Sarah Pacheco  Procedure(s) Performed: EXPLORATORY LAPAROTOMY RIGHT COLECTOMY (N/A )  Patient Location: PACU  Anesthesia Type:General  Level of Consciousness: awake and alert   Airway & Oxygen Therapy: Patient Spontanous Breathing and Patient connected to face mask oxygen  Post-op Assessment: Report given to RN and Post -op Vital signs reviewed and stable  Post vital signs: Reviewed and stable  Last Vitals:  Vitals Value Taken Time  BP 163/76 10/10/2018  2:42 PM  Temp    Pulse 86 10/10/2018  2:43 PM  Resp 18 10/10/2018  2:43 PM  SpO2 100 % 10/10/2018  2:43 PM  Vitals shown include unvalidated device data.  Last Pain:  Vitals:   10/10/18 1204  TempSrc: Oral  PainSc: 8       Patients Stated Pain Goal: 3 (10/10/18 1204)  Complications: No apparent anesthesia complications

## 2018-10-10 NOTE — ED Provider Notes (Signed)
[facility] Provider Note   CSN: [id number] Arrival date & time: 10/10/18  0532    History   Chief Complaint Chief Complaint  Patient presents with  . Abdominal Pain    HPI Sarah Pacheco is a 73 y.o. female.     HPI  73 year old female presents with abdominal pain.  Started 2 or 3 days ago.  It is mostly a cramping pain that does not go away.  It is in her epigastrium.  However at times will become sharp and more severe.  Nothing specific induces this.  She is not been eating much and does not know if eating has made it worse.  No vomiting.  It started off with diarrhea but this is improved.  There was never any blood.  No fevers, chest pain or shortness of breath.  No urinary symptoms.  No radiation of the pain.  Past Medical History:  Diagnosis Date  . Anxiety   . Arthritis   . Cataract   . Chest pain   . Depression   . GERD (gastroesophageal reflux disease)   . Hyperlipidemia   . Hyperlipidemia with target LDL less than 70 10/25/2015    Patient Active Problem List   Diagnosis Date Noted  . Cecal volvulus (HCC) 10/10/2018  . Hyperlipidemia with target LDL less than 70 10/25/2015    Past Surgical History:  Procedure Laterality Date  . APPENDECTOMY    . CATARACT EXTRACTION, BILATERAL    . COLONOSCOPY  2017   X2     colon polyps  . VAGINAL HYSTERECTOMY  1973   AUB, fibroids     OB History    Gravida  2   Para  2   Term      Preterm      AB      Living  2     SAB      TAB      Ectopic      Multiple      Live Births  2            Home Medications    Prior to Admission medications   Medication Sig Start Date End Date Taking? Authorizing Provider  acetaminophen (TYLENOL) 325 MG tablet Take 650 mg by mouth every 6 (six) hours as needed for mild pain or headache.   Yes [provider]  Coenzyme Q10 (CO Q 10 PO) Take 1 tablet by mouth daily.   Yes [provider]  estradiol (ESTRACE)  0.5 MG tablet Take 0.5 mg by mouth daily. 10/16/15  Yes [provider]  pantoprazole (PROTONIX) 40 MG tablet Take 1 tablet (40 mg total) by mouth daily. 08/16/18  Yes Lynann Bologna, MD  PARoxetine (PAXIL) 20 MG tablet Take 20 mg by mouth daily.   Yes [provider]  phentermine (ADIPEX-P) 37.5 MG tablet Take 37.5 mg by mouth daily. 09/13/18  Yes [provider]  simvastatin (ZOCOR) 40 MG tablet Take 40 mg by mouth daily.   Yes [provider]    Family History Family History  Problem Relation Age of Onset  . Heart attack Father 6       MI  . Heart disease Father   . Heart disease Sister   . Heart attack Brother 59       MI  . Heart attack Sister   . Heart disease Sister   . Colon cancer Sister 31  . Colon polyps Sister   . Heart  disease Brother   . Heart failure Brother   . Colon cancer Brother 56  . Colon polyps Brother   . COPD Brother   . Lung cancer Brother   . Esophageal cancer Neg Hx   . Rectal cancer Neg Hx   . Stomach cancer Neg Hx     Social History Social History   Tobacco Use  . Smoking status: Former Smoker    Packs/day: 0.50    Years: 15.00    Pack years: 7.50    Types: Cigarettes  . Smokeless tobacco: Never Used  . Tobacco comment: Quit around age 52  Substance Use Topics  . Alcohol use: No  . Drug use: No     Allergies   Atorvastatin; Codeine; Erythromycin base; and Penicillin g   Review of Systems Review of Systems  Constitutional: Negative for fever.  Respiratory: Negative for shortness of breath.   Cardiovascular: Negative for chest pain.  Gastrointestinal: Positive for abdominal pain and diarrhea. Negative for blood in stool and vomiting.  Genitourinary: Negative for dysuria.  All other systems reviewed and are negative.    Physical Exam Updated Vital Signs BP (!) 163/76 (BP Location: Right Arm)   Pulse 83   Temp (!) (P) 97.3 F (36.3 C)   Resp (!) 23   Ht 5\' 3"  (1.6 m)   Wt 67.1 kg   SpO2 100%    BMI 26.22 kg/m   Physical Exam Vitals signs and nursing note reviewed.  Constitutional:      Appearance: She is well-developed. She is not ill-appearing or diaphoretic.  HENT:     Head: Normocephalic and atraumatic.     Right Ear: External ear normal.     Left Ear: External ear normal.     Nose: Nose normal.  Eyes:     General:        Right eye: No discharge.        Left eye: No discharge.  Cardiovascular:     Rate and Rhythm: Normal rate and regular rhythm.     Heart sounds: Normal heart sounds.  Pulmonary:     Effort: Pulmonary effort is normal.     Breath sounds: Normal breath sounds.  Abdominal:     Palpations: Abdomen is soft.     Tenderness: There is generalized abdominal tenderness (worst in epigastrium, but has diffuse tenderness).  Skin:    General: Skin is warm and dry.  Neurological:     Mental Status: She is alert.  Psychiatric:        Mood and Affect: Mood is not anxious.      ED Treatments / Results  Labs (all labs ordered are listed, but only abnormal results are displayed) Labs Reviewed  LIPASE, BLOOD - Abnormal; Notable for the following components:      Result Value   Lipase 59 (*)    All other components within normal limits  COMPREHENSIVE METABOLIC PANEL - Abnormal; Notable for the following components:   Glucose, Bld 124 (*)    All other components within normal limits  URINALYSIS, ROUTINE W REFLEX MICROSCOPIC - Abnormal; Notable for the following components:   APPearance CLOUDY (*)    Hgb urine dipstick SMALL (*)    Ketones, ur 20 (*)    Protein, ur 30 (*)    Bacteria, UA MANY (*)    All other components within normal limits  MRSA PCR SCREENING  CBC WITH DIFFERENTIAL/PLATELET  I-STAT TROPONIN, ED  TYPE AND SCREEN  ABO/RH  SURGICAL PATHOLOGY  EKG EKG Interpretation  Date/Time:  Monday October 10 2018 06:14:24 EST Ventricular Rate:  86 PR Interval:    QRS Duration: 66 QT Interval:  460 QTC Calculation: 551 R Axis:   7 Text  Interpretation:  Sinus rhythm Borderline repolarization abnormality Prolonged QT interval No old tracing to compare Confirmed by Pricilla Loveless [phone]) on 10/10/2018 7:21:27 AM   Radiology Ct Abdomen Pelvis W Contrast  Result Date: 10/10/2018 CLINICAL DATA:  73 year old with acute abdominal pain. EXAM: CT ABDOMEN AND PELVIS WITH CONTRAST TECHNIQUE: Multidetector CT imaging of the abdomen and pelvis was performed using the standard protocol following bolus administration of intravenous contrast. CONTRAST:  ISOVUE-300 IOPAMIDOL (ISOVUE-300) INJECTION 61% COMPARISON:  Abdominal ultrasound 07/21/2018 appears FINDINGS: Lower chest: 6 mm calcified granuloma in the right middle lobe. There is a calcification in the right hilum. Findings compatible with old granulomatous disease. Small amount of bronchiectasis in the lingula. Dependent atelectasis at the lung bases. No large pleural effusions. Small hiatal hernia. Mildly lobulated low-density structure in the right epicardial fat. This structure roughly measures 4.2 x 2.1 cm on sequence 2, image 8. Attenuation of this structure is water density. Hepatobiliary: Normal appearance of the liver, gallbladder and portal venous system. No biliary dilatation Pancreas: Unremarkable. No pancreatic ductal dilatation or surrounding inflammatory changes. Spleen: Calcifications of spleen compatible with old granulomatous disease. Negative for splenic enlargement. Adrenals/Urinary Tract: Normal adrenal glands. Normal urinary bladder. Normal appearance of both kidneys without hydronephrosis. Stomach/Bowel: Small hiatal hernia. The cecum is located in the left upper abdomen and markedly distended, measuring close to 10 cm. There is mesenteric inflammation and marked narrowing of the colon in right abdomen on sequence 2, image 56. Right colon is completely decompressed on sequence 2 image 53. This pattern of disease is suggestive for cecal volvulus. Normal appearance of the stomach  and small bowel. Vascular/Lymphatic: Atherosclerotic disease in the abdominal aorta without aneurysm. The celiac trunk and SMA are patent. Venous structures are unremarkable. No lymph node enlargement in the abdomen or pelvis. Reproductive: Status post hysterectomy. No adnexal masses. Other: Small amount of ascites in the pelvis. Negative for free air. Musculoskeletal: No acute bone abnormality. IMPRESSION: 1. Cecal volvulus. Cecum is markedly distended in the left upper quadrant with volvulus in the right abdomen as described. Small amount of pelvic ascites. No evidence for free air. 2. Fluid attenuating structure in right epicardial fat region. Findings are nonspecific but could represent a pericardial cyst. 3. Evidence for old granulomatous disease. 4. Small hiatal hernia. 5.  Aortic Atherosclerosis (ICD10-I70.0). These results were called by telephone at the time of interpretation on 10/10/2018 at 9:41 am to Dr. Pricilla Loveless , who verbally acknowledged these results. Electronically Signed   By: Richarda Overlie M.D.   On: 10/10/2018 09:45    Procedures Procedures (including critical care time)  Medications Ordered in ED Medications  iopamidol (ISOVUE-300) 61 % injection (has no administration in time range)  sodium chloride (PF) 0.9 % injection (has no administration in time range)  simvastatin (ZOCOR) tablet 40 mg ( Oral Automatically Held 10/18/18 1800)  PARoxetine (PAXIL) tablet 20 mg ( Oral Automatically Held 10/18/18 1000)  phentermine (ADIPEX-P) tablet 37.5 mg ( Oral Automatically Held 10/18/18 1000)  estradiol (ESTRACE) tablet 0.5 mg ( Oral Automatically Held 10/18/18 1000)  0.9 %  sodium chloride infusion (has no administration in time range)  acetaminophen (TYLENOL) tablet 650 mg ( Oral MAR Hold 10/10/18 1148)    Or  acetaminophen (TYLENOL) suppository 650 mg ( Rectal  MAR Hold 10/10/18 1148)  fentaNYL (SUBLIMAZE) injection 12.5-25 mcg ( Intravenous MAR Hold 10/10/18 1148)  methocarbamol (ROBAXIN)  tablet 500 mg ( Oral MAR Hold 10/10/18 1148)  diphenhydrAMINE (BENADRYL) 12.5 MG/5ML elixir 12.5 mg ( Oral MAR Hold 10/10/18 1148)    Or  diphenhydrAMINE (BENADRYL) injection 12.5 mg ( Intravenous MAR Hold 10/10/18 1148)  ondansetron (ZOFRAN-ODT) disintegrating tablet 4 mg ( Oral MAR Hold 10/10/18 1148)    Or  ondansetron (ZOFRAN) injection 4 mg ( Intravenous MAR Hold 10/10/18 1148)  simethicone (MYLICON) chewable tablet 40 mg ( Oral MAR Hold 10/10/18 1148)  pantoprazole (PROTONIX) injection 40 mg ( Intravenous Automatically Held 10/18/18 2200)  hydrALAZINE (APRESOLINE) injection 10 mg ( Intravenous MAR Hold 10/10/18 1148)  lactated ringers infusion ( Intravenous New Bag/Given 10/10/18 1507)  fentaNYL (SUBLIMAZE) injection 25-50 mcg (50 mcg Intravenous Given 10/10/18 1508)  meperidine (DEMEROL) injection 6.25 mg (has no administration in time range)  metoCLOPramide (REGLAN) injection 10 mg (has no administration in time range)  fentaNYL (SUBLIMAZE) 100 MCG/2ML injection (has no administration in time range)  fentaNYL (SUBLIMAZE) 100 MCG/2ML injection (has no administration in time range)  fentaNYL (SUBLIMAZE) 100 MCG/2ML injection (has no administration in time range)  HYDROmorphone (DILAUDID) injection 0.25-0.5 mg (has no administration in time range)  sodium chloride 0.9 % bolus 1,000 mL (0 mLs Intravenous Stopped 10/10/18 1016)  fentaNYL (SUBLIMAZE) injection 100 mcg (100 mcg Intravenous Given 10/10/18 0820)  iopamidol (ISOVUE-300) 61 % injection 100 mL (100 mLs Intravenous Contrast Given 10/10/18 0910)  fentaNYL (SUBLIMAZE) injection 100 mcg (100 mcg Intravenous Given 10/10/18 1016)  clindamycin (CLEOCIN) IVPB 900 mg ( Intravenous Anesthesia Volume Adjustment 10/10/18 1443)  gentamicin (GARAMYCIN) 290 mg in dextrose 5 % 100 mL IVPB (290 mg Intravenous New Bag/Given 10/10/18 1304)     Initial Impression / Assessment and Plan / ED Course  I have reviewed the triage vital signs and the nursing notes.  Pertinent labs  & imaging results that were available during my care of the patient were reviewed by me and considered in my medical decision making (see chart for details).        General surgery consulted for the patient's cecal volvulus.  Her pain is better controlled with IV fentanyl.  She was given IV fluids. She will be taken urgently to the OR for operative repair.   Final Clinical Impressions(s) / ED Diagnoses   Final diagnoses:  Cecal volvulus [facility])    ED Discharge Orders    None       Pricilla Loveless, MD 10/10/18 [phone]

## 2018-10-10 NOTE — Op Note (Signed)
Preoperative diagnosis: Cecal volvulus Postoperative diagnosis: Same as above Procedure: Open right hemicolectomy Surgeon Dr Harden Mo Asst: Carlena Bjornstad PA-C EBL: 50 cc Drains none Specimens right colon to pathology Anesthesia general Complications none Sponge and needle count correct times two dispo to recovery stable  Indications: This is a 47 yof who has abdominal pain for several days.  She is tender.  On CT scan she appears to have a cecal volvulus.  I discussed going to the operating room urgently for a right colectomy.  Procedure: After informed consent was obtained the patient was given antibiotics.  She had SCDs in place.  She was placed under general anesthesia without complication.  A Foley catheter was placed.  She was prepped and draped in the standard sterile surgical fashion.  Surgical timeout was then performed.  I made a midline incision and entered in the peritoneal cavity.  There was a fair amount of reactive fluid.  Her small bowel was not dilated.  Her cecum was very dilated.  It was dusky but had not perforated.  In the process of eviscerating this out of the abdomen I detorsed the volvulus.  I then was able to take down the white line of Toldt with cautery and rotate this medially.  The duodenum was observed and not injured.  I then was able to divide the terminal ileum with a GIA stapler.  I divided the transverse colon at a healthy portion after rolled me removing the omentum as well.  I then used a combination of the LigaSure device and 2-0 silk sutures to divide the mesentery which was very elongated.  I then passed this off the table as a specimen.  I then ran the entire small bowel with no evidence of any injury and no abnormality.  The remainder of the colon had stool and diverticuli present but no other abnormality.  I then brought the small bowel in proximity of the transverse colon.  I placed 3-0 silk sutures to oppose them.  I then I made enterotomies in both.   I inserted the GIA stapler and created the anastomosis.  This was patent and hemostatic.  I then closed the common enterotomy with a TX stapler.  I oversewed some of this with 3-0 silk suture.  I placed two 3-0 silk apex sutures.  I then irrigated copiously.  I then placed the omentum overlying the anastomosis and returned the bowel to the peritoneal cavity.  We then used the colon protocol and changed all of the instruments as well as are gowns and gloves.  I then used the #1 looped PDS to close the abdomen.  The wound was then irrigated again.  Staples were used to close the skin.  A honeycomb dressing was placed.  She tolerated this well was extubated and transferred to recovery stable.

## 2018-10-10 NOTE — Anesthesia Preprocedure Evaluation (Signed)
Anesthesia Evaluation  Patient identified by MRN, date of birth, ID band Patient awake    Reviewed: Allergy & Precautions, NPO status , Patient's Chart, lab work & pertinent test results  Airway Mallampati: II  TM Distance: >3 FB Neck ROM: Full    Dental no notable dental hx.    Pulmonary former smoker,    Pulmonary exam normal breath sounds clear to auscultation       Cardiovascular negative cardio ROS Normal cardiovascular exam Rhythm:Regular Rate:Normal     Neuro/Psych negative neurological ROS  negative psych ROS   GI/Hepatic negative GI ROS, Neg liver ROS,   Endo/Other  negative endocrine ROS  Renal/GU negative Renal ROS  negative genitourinary   Musculoskeletal negative musculoskeletal ROS (+)   Abdominal   Peds negative pediatric ROS (+)  Hematology negative hematology ROS (+)   Anesthesia Other Findings   Reproductive/Obstetrics negative OB ROS                             Anesthesia Physical Anesthesia Plan  ASA: II and emergent  Anesthesia Plan: General   Post-op Pain Management:    Induction: Intravenous, Rapid sequence and Cricoid pressure planned  PONV Risk Score and Plan: 4 or greater and Ondansetron, Dexamethasone and Treatment may vary due to age or medical condition  Airway Management Planned: Oral ETT  Additional Equipment:   Intra-op Plan:   Post-operative Plan: Extubation in OR  Informed Consent: I have reviewed the patients History and Physical, chart, labs and discussed the procedure including the risks, benefits and alternatives for the proposed anesthesia with the patient or authorized representative who has indicated his/her understanding and acceptance.     Dental advisory given  Plan Discussed with: CRNA  Anesthesia Plan Comments:         Anesthesia Quick Evaluation

## 2018-10-10 NOTE — ED Notes (Signed)
This RN attempted to gain I-stat troponin x1 unsuccessfully. MD aware. NT to draw labs.

## 2018-10-10 NOTE — Anesthesia Procedure Notes (Signed)
Procedure Name: Intubation Date/Time: 10/10/2018 1:03 PM Performed by: Eben Burow, CRNA Pre-anesthesia Checklist: Patient identified, Emergency Drugs available, Suction available, Patient being monitored and Timeout performed Patient Re-evaluated:Patient Re-evaluated prior to induction Oxygen Delivery Method: Circle system utilized Preoxygenation: Pre-oxygenation with 100% oxygen Induction Type: IV induction, Cricoid Pressure applied and Rapid sequence Laryngoscope Size: Mac and 4 Grade View: Grade I Tube type: Oral Number of attempts: 1 Airway Equipment and Method: Stylet Placement Confirmation: ETT inserted through vocal cords under direct vision,  positive ETCO2 and breath sounds checked- equal and bilateral Secured at: 21 cm Tube secured with: Tape Dental Injury: Teeth and Oropharynx as per pre-operative assessment

## 2018-10-10 NOTE — ED Triage Notes (Signed)
Pt reports intermittent epigastric pain/cramping since 2/29 with nausea.

## 2018-10-10 NOTE — ED Notes (Signed)
ED Provider at bedside. 

## 2018-10-10 NOTE — Progress Notes (Signed)
Patient arrived to room 1526 from ED. Awake and alert. Consent for surgery signed and CHG bath done. Swab for MRSA done and sent to lab. OR tech here to transport patient to OR. Patient  To OR via wheelchair. Lina Sar, RN

## 2018-10-10 NOTE — ED Notes (Signed)
Patient transported to CT 

## 2018-10-10 NOTE — ED Notes (Signed)
Surgical PA @ bedside.

## 2018-10-10 NOTE — H&P (Signed)
[facility] Admission Note  Sarah Pacheco [facility] 25-Aug-1945  [id number].    Requesting MD: Dr. Criss Alvine Chief Complaint/Reason for Consult: Abdominal Pain/Cecal Volvulus   HPI:  Sarah Pacheco is a 73 y.o. female who presented to [facility] long ED today with abdominal pain.  Patient states that she had intermittent generalized abdominal pains that began on Thursday, 2/27, and became persistent on Saturday 2/29.  She states that this is a pressure-like pain that is also cramping in nature, most severe in her epigastrium but also reports pain that radiates down to her right lower quadrant.  She notes her entire abdomen feels "sore".  She notes associated nausea and abdominal distension.  No fever, chills, emesis or diarrhea.  Patient has not been able to pass flatus or have a BM since Saturday.   In the ED patient's vital signs with temperature of 97.9, pulse 75-78, RR 13-17, BP 162/83-176/91, SPO2 97-100% on room air.  WBC 8.5. Cr 0.92. CTAP with cecal volvulus.  The cecum is markedly distended left upper quadrant with volvulus in the right abdomen.  There is no evidence of free air. General surgery was asked to consult.   Last meal Sunday, 3/1, soup Last BM Unsure, she believes on Friday  PMH significant for Hyperlipidema Abdominal surgical history: Appendectomy  Anticoagulants: None Denies tobacco use Denies Alcohol use Employment: Retired Patients friend Sarah Pacheco is at bedside  ROS: Review of Systems  All other systems reviewed and are negative.    Family History  Problem Relation Age of Onset  . Heart attack Father 94       MI  . Heart disease Father   . Heart disease Sister   . Heart attack Brother 59       MI  . Heart attack Sister   . Heart disease Sister   . Colon cancer Sister 31  . Colon polyps Sister   . Heart disease Brother   . Heart failure Brother   . Colon cancer Brother 44  . Colon polyps Brother   . COPD Brother   . Lung cancer Brother   . Esophageal  cancer Neg Hx   . Rectal cancer Neg Hx   . Stomach cancer Neg Hx     Past Medical History:  Diagnosis Date  . Anxiety   . Arthritis   . Cataract   . Chest pain   . Depression   . GERD (gastroesophageal reflux disease)   . Hyperlipidemia   . Hyperlipidemia with target LDL less than 70 10/25/2015    Past Surgical History:  Procedure Laterality Date  . APPENDECTOMY    . CATARACT EXTRACTION, BILATERAL    . COLONOSCOPY  2017   X2     colon polyps  . VAGINAL HYSTERECTOMY  1973   AUB, fibroids    Social History:  reports that she has quit smoking. Her smoking use included cigarettes. She has a 7.50 pack-year smoking history. She has never used smokeless tobacco. She reports that she does not drink alcohol or use drugs.  Allergies:  Allergies  Allergen Reactions  . Atorvastatin Other (See Comments)    Cramps in legs  . Codeine Nausea And Vomiting    unknown  . Erythromycin Base Other (See Comments)    Sick on the stomach  . Penicillin G Hives    Unknown Did it involve swelling of the face/tongue/throat, SOB, or low BP? no Did it involve sudden or severe rash/hives, skin peeling, or any reaction on the inside  of your mouth or nose? yes Did you need to seek medical attention at a hospital or doctor's office? yes When did it last happen?2010 If all above answers are "NO", may proceed with cephalosporin use.     (Not in a hospital admission)   Prior to Admission medications   Medication Sig Start Date End Date Taking? Authorizing Provider  acetaminophen (TYLENOL) 325 MG tablet Take 650 mg by mouth every 6 (six) hours as needed for mild pain or headache.   Yes [provider]  Coenzyme Q10 (CO Q 10 PO) Take 1 tablet by mouth daily.   Yes [provider]  estradiol (ESTRACE) 0.5 MG tablet Take 0.5 mg by mouth daily. 10/16/15  Yes [provider]  pantoprazole (PROTONIX) 40 MG tablet Take 1 tablet (40 mg total) by mouth daily. 08/16/18  Yes Lynann Bologna, MD  PARoxetine (PAXIL) 20 MG tablet Take 20 mg by mouth daily.   Yes [provider]  phentermine (ADIPEX-P) 37.5 MG tablet Take 37.5 mg by mouth daily. 09/13/18  Yes [provider]  simvastatin (ZOCOR) 40 MG tablet Take 40 mg by mouth daily.   Yes [provider]    Blood pressure (!) 162/83, pulse 78, temperature 97.9 F (36.6 C), temperature source Oral, resp. rate 13, height 5\' 3"  (1.6 m), weight 67.1 kg, SpO2 98 %. Physical Exam: General: pleasant, WD/WN white female who is laying in bed in NAD HEENT: head is normocephalic, atraumatic.  Sclera are noninjected.  Pupils equal and round.  Ears and nose without any masses or lesions.  Mouth is pink and moist. Dentition fair Heart: regular, rate, and rhythm.  No obvious murmurs, gallops, or rubs noted.  Palpable pedal pulses bilaterally Lungs: CTAB, no wheezes, rhonchi, or rales noted.  Respiratory effort nonlabored Abd: Soft, distended, generalized tenderness worse in the right lower quadrant. No r/r/g. Hypoactive bowel sounds with some high pitched tinkling. No palpable masses or organomegaly. MS: all 4 extremities are symmetrical with no cyanosis, clubbing, or edema. Skin: warm and dry with no masses, lesions, or rashes Psych: A&Ox3 with an appropriate affect. Neuro: cranial nerves grossly intact, extremity CSM intact bilaterally, normal speech  Results for orders placed or performed during the hospital encounter of 10/10/18 (from the past 48 hour(s))  Lipase, blood     Status: Abnormal   Collection Time: 10/10/18  6:07 AM  Result Value Ref Range   Lipase 59 (H) 11 - 51 U/L    Comment: Performed at [facility], [address] [place], [place] [address]  Comprehensive metabolic panel     Status: Abnormal   Collection Time: 10/10/18  6:07 AM  Result Value Ref Range   Sodium 138 135 - 145 mmol/L   Potassium 4.3 3.5 - 5.1 mmol/L   Chloride 107 98 - 111 mmol/L   CO2 23 22 - 32 mmol/L    Glucose, Bld 124 (H) 70 - 99 mg/dL   BUN 10 8 - 23 mg/dL   Creatinine, Ser 6.04 0.44 - 1.00 mg/dL   Calcium 9.0 8.9 - 54.0 mg/dL   Total Protein 7.6 6.5 - 8.1 g/dL   Albumin 4.5 3.5 - 5.0 g/dL   AST 19 15 - 41 U/L   ALT 12 0 - 44 U/L   Alkaline Phosphatase 44 38 - 126 U/L   Total Bilirubin 0.4 0.3 - 1.2 mg/dL   GFR calc non Af Amer >60 >60 mL/min   GFR calc Af Amer >60 >60  mL/min   Anion gap 8 5 - 15    Comment: Performed at [facility], [address], [place], [place] [address]  CBC with Differential     Status: None   Collection Time: 10/10/18  6:07 AM  Result Value Ref Range   WBC 8.5 4.0 - 10.5 K/uL   RBC 4.34 3.87 - 5.11 MIL/uL   Hemoglobin 12.8 12.0 - 15.0 g/dL   HCT 60.4 54.0 - 98.1 %   MCV 95.4 80.0 - 100.0 fL   MCH 29.5 26.0 - 34.0 pg   MCHC 30.9 30.0 - 36.0 g/dL   RDW 19.1 47.8 - 29.5 %   Platelets 296 150 - 400 K/uL   nRBC 0.0 0.0 - 0.2 %   Neutrophils Relative % 71 %   Neutro Abs 6.0 1.7 - 7.7 K/uL   Lymphocytes Relative 21 %   Lymphs Abs 1.8 0.7 - 4.0 K/uL   Monocytes Relative 7 %   Monocytes Absolute 0.6 0.1 - 1.0 K/uL   Eosinophils Relative 0 %   Eosinophils Absolute 0.0 0.0 - 0.5 K/uL   Basophils Relative 1 %   Basophils Absolute 0.1 0.0 - 0.1 K/uL   Immature Granulocytes 0 %   Abs Immature Granulocytes 0.02 0.00 - 0.07 K/uL    Comment: Performed at [facility], [address] [place], [place] [address]  Urinalysis, Routine w reflex microscopic     Status: Abnormal   Collection Time: 10/10/18  7:48 AM  Result Value Ref Range   Color, Urine YELLOW YELLOW   APPearance CLOUDY (A) CLEAR   Specific Gravity, Urine 1.026 1.005 - 1.030   pH 5.0 5.0 - 8.0   Glucose, UA NEGATIVE NEGATIVE mg/dL   Hgb urine dipstick SMALL (A) NEGATIVE   Bilirubin Urine NEGATIVE NEGATIVE   Ketones, ur 20 (A) NEGATIVE mg/dL   Protein, ur 30 (A) NEGATIVE mg/dL   Nitrite NEGATIVE NEGATIVE   Leukocytes,Ua NEGATIVE NEGATIVE   RBC / HPF  11-20 0 - 5 RBC/hpf   WBC, UA 0-5 0 - 5 WBC/hpf   Bacteria, UA MANY (A) NONE SEEN   Squamous Epithelial / LPF 21-50 0 - 5   Mucus PRESENT     Comment: Performed at [facility], [address], [place], [place] [address]   Ct Abdomen Pelvis W Contrast  Result Date: 10/10/2018 CLINICAL DATA:  73 year old with acute abdominal pain. EXAM: CT ABDOMEN AND PELVIS WITH CONTRAST TECHNIQUE: Multidetector CT imaging of the abdomen and pelvis was performed using the standard protocol following bolus administration of intravenous contrast. CONTRAST:  ISOVUE-300 IOPAMIDOL (ISOVUE-300) INJECTION 61% COMPARISON:  Abdominal ultrasound 07/21/2018 appears FINDINGS: Lower chest: 6 mm calcified granuloma in the right middle lobe. There is a calcification in the right hilum. Findings compatible with old granulomatous disease. Small amount of bronchiectasis in the lingula. Dependent atelectasis at the lung bases. No large pleural effusions. Small hiatal hernia. Mildly lobulated low-density structure in the right epicardial fat. This structure roughly measures 4.2 x 2.1 cm on sequence 2, image 8. Attenuation of this structure is water density. Hepatobiliary: Normal appearance of the liver, gallbladder and portal venous system. No biliary dilatation Pancreas: Unremarkable. No pancreatic ductal dilatation or surrounding inflammatory changes. Spleen: Calcifications of spleen compatible with old granulomatous disease. Negative for splenic enlargement. Adrenals/Urinary Tract: Normal adrenal glands. Normal urinary bladder. Normal appearance of both kidneys without hydronephrosis. Stomach/Bowel: Small hiatal hernia. The cecum is located in the left upper abdomen and  markedly distended, measuring close to 10 cm. There is mesenteric inflammation and marked narrowing of the colon in right abdomen on sequence 2, image 56. Right colon is completely decompressed on sequence 2 image 53. This pattern of disease is  suggestive for cecal volvulus. Normal appearance of the stomach and small bowel. Vascular/Lymphatic: Atherosclerotic disease in the abdominal aorta without aneurysm. The celiac trunk and SMA are patent. Venous structures are unremarkable. No lymph node enlargement in the abdomen or pelvis. Reproductive: Status post hysterectomy. No adnexal masses. Other: Small amount of ascites in the pelvis. Negative for free air. Musculoskeletal: No acute bone abnormality. IMPRESSION: 1. Cecal volvulus. Cecum is markedly distended in the left upper quadrant with volvulus in the right abdomen as described. Small amount of pelvic ascites. No evidence for free air. 2. Fluid attenuating structure in right epicardial fat region. Findings are nonspecific but could represent a pericardial cyst. 3. Evidence for old granulomatous disease. 4. Small hiatal hernia. 5.  Aortic Atherosclerosis (ICD10-I70.0). These results were called by telephone at the time of interpretation on 10/10/2018 at 9:41 am to Dr. Pricilla Loveless , who verbally acknowledged these results. Electronically Signed   By: Richarda Overlie M.D.   On: 10/10/2018 09:45    Anti-infectives (From admission, onward)   Start     Dose/Rate Route Frequency Ordered Stop   10/10/18 1045  cefoTEtan (CEFOTAN) 2 g in sodium chloride 0.9 % 100 mL IVPB     2 g 200 mL/hr over 30 Minutes Intravenous Every 12 hours 10/10/18 1040         Assessment/Plan Cecal Volvulus - Will plan to take the patient to the OR for emergent Exploratory Laparotomy for a Right Colectomy with Dr. Dwain Sarna. Risks and benefits were discussed with the patient who would like to proceed. Patient was started on IV abx.   Jacinto Halim, [facility] 10/10/2018, 10:44 AM Pager: [phone]

## 2018-10-11 ENCOUNTER — Encounter (HOSPITAL_COMMUNITY): Payer: Self-pay | Admitting: General Surgery

## 2018-10-11 LAB — CBC
HCT: 39.2 % (ref 36.0–46.0)
HEMOGLOBIN: 11.7 g/dL — AB (ref 12.0–15.0)
MCH: 29 pg (ref 26.0–34.0)
MCHC: 29.8 g/dL — ABNORMAL LOW (ref 30.0–36.0)
MCV: 97.3 fL (ref 80.0–100.0)
Platelets: 257 10*3/uL (ref 150–400)
RBC: 4.03 MIL/uL (ref 3.87–5.11)
RDW: 14.1 % (ref 11.5–15.5)
WBC: 13.4 10*3/uL — ABNORMAL HIGH (ref 4.0–10.5)
nRBC: 0 % (ref 0.0–0.2)

## 2018-10-11 LAB — BASIC METABOLIC PANEL
Anion gap: 7 (ref 5–15)
BUN: 7 mg/dL — ABNORMAL LOW (ref 8–23)
CO2: 21 mmol/L — ABNORMAL LOW (ref 22–32)
Calcium: 7.6 mg/dL — ABNORMAL LOW (ref 8.9–10.3)
Chloride: 108 mmol/L (ref 98–111)
Creatinine, Ser: 0.61 mg/dL (ref 0.44–1.00)
GFR calc Af Amer: >60 mL/min (ref >60)
Glucose, Bld: 128 mg/dL — ABNORMAL HIGH (ref 70–99)
POTASSIUM: 4.3 mmol/L (ref 3.5–5.1)
Sodium: 136 mmol/L (ref 135–145)

## 2018-10-11 MED ORDER — SODIUM CHLORIDE 0.9 % IV SOLN
INTRAVENOUS | Status: DC
  Administered 2018-10-11: 16:00:00 via INTRAVENOUS

## 2018-10-11 NOTE — Anesthesia Postprocedure Evaluation (Signed)
Anesthesia Post Note  Patient: Sarah Pacheco  Procedure(s) Performed: EXPLORATORY LAPAROTOMY RIGHT COLECTOMY (N/A )     Patient location during evaluation: PACU Anesthesia Type: General Level of consciousness: awake and alert Pain management: pain level controlled Vital Signs Assessment: post-procedure vital signs reviewed and stable Respiratory status: spontaneous breathing, nonlabored ventilation, respiratory function stable and patient connected to nasal cannula oxygen Cardiovascular status: blood pressure returned to baseline and stable Postop Assessment: no apparent nausea or vomiting Anesthetic complications: no    Last Vitals:  Vitals:   10/11/18 0207 10/11/18 0549  BP: 136/71 140/85  Pulse: 83 86  Resp: 16 16  Temp: (!) 36.4 C 36.7 C  SpO2: 96% 94%    Last Pain:  Vitals:   10/11/18 0549  TempSrc: Oral  PainSc:                  Phillips Grout

## 2018-10-11 NOTE — Progress Notes (Signed)
1 Day Post-Op  Subjective: CC: Abdominal Soreness Patient complains of soreness/burning around midline wound. Minimal abdominal pain otherwise. Tolerating CLD this AM without N/V. Has ambulate in room and halls. Foley removed this AM. She has been able to void x1 since.   Objective: Vital signs in last 24 hours: Temp:  [96 F (35.6 C)-98.6 F (37 C)] 98.1 F (36.7 C) (03/03 0549) Pulse Rate:  [75-86] 86 (03/03 0549) Resp:  [13-25] 16 (03/03 0549) BP: (136-164)/(68-92) 140/85 (03/03 0549) SpO2:  [94 %-100 %] 94 % (03/03 0549) Weight:  [67.1 kg-68.4 kg] 68.4 kg (03/03 0549)    Intake/Output from previous day: 03/02 0701 - 03/03 0700 In: 2333.4 [P.O.:840; I.V.:1336.2; IV Piggyback:157.3] Out: 1700 [Urine:1650; Blood:50] Intake/Output this shift: No intake/output data recorded.  PE: Gen: Awake and alert, NAD, resting comfortably in bed Heart: RRR Lungs: CTA b/l, normal efffort Abd: Soft, ND, appropriate amount of tenderness around midline wound and epigastrium. Honeycomb dressing in place over midline wound with some dried blood at base. +BS. Msk: No edema  Lab Results:  Recent Labs    10/10/18 0607 10/11/18 0500  WBC 8.5 13.4*  HGB 12.8 11.7*  HCT 41.4 39.2  PLT 296 257   BMET Recent Labs    10/10/18 0607 10/11/18 0500  NA 138 136  K 4.3 4.3  CL 107 108  CO2 23 21*  GLUCOSE 124* 128*  BUN 10 7*  CREATININE 0.92 0.61  CALCIUM 9.0 7.6*   PT/INR No results for input(s): LABPROT, INR in the last 72 hours. CMP     Component Value Date/Time   NA 136 10/11/2018 0500   K 4.3 10/11/2018 0500   CL 108 10/11/2018 0500   CO2 21 (L) 10/11/2018 0500   GLUCOSE 128 (H) 10/11/2018 0500   BUN 7 (L) 10/11/2018 0500   CREATININE 0.61 10/11/2018 0500   CALCIUM 7.6 (L) 10/11/2018 0500   PROT 7.6 10/10/2018 0607   ALBUMIN 4.5 10/10/2018 0607   AST 19 10/10/2018 0607   ALT 12 10/10/2018 0607   ALKPHOS 44 10/10/2018 0607   BILITOT 0.4 10/10/2018 0607   GFRNONAA  >60 10/11/2018 0500   GFRAA >60 10/11/2018 0500   Lipase     Component Value Date/Time   LIPASE 59 (H) 10/10/2018 0607       Studies/Results: Ct Abdomen Pelvis W Contrast  Result Date: 10/10/2018 CLINICAL DATA:  73 year old with acute abdominal pain. EXAM: CT ABDOMEN AND PELVIS WITH CONTRAST TECHNIQUE: Multidetector CT imaging of the abdomen and pelvis was performed using the standard protocol following bolus administration of intravenous contrast. CONTRAST:  ISOVUE-300 IOPAMIDOL (ISOVUE-300) INJECTION 61% COMPARISON:  Abdominal ultrasound 07/21/2018 appears FINDINGS: Lower chest: 6 mm calcified granuloma in the right middle lobe. There is a calcification in the right hilum. Findings compatible with old granulomatous disease. Small amount of bronchiectasis in the lingula. Dependent atelectasis at the lung bases. No large pleural effusions. Small hiatal hernia. Mildly lobulated low-density structure in the right epicardial fat. This structure roughly measures 4.2 x 2.1 cm on sequence 2, image 8. Attenuation of this structure is water density. Hepatobiliary: Normal appearance of the liver, gallbladder and portal venous system. No biliary dilatation Pancreas: Unremarkable. No pancreatic ductal dilatation or surrounding inflammatory changes. Spleen: Calcifications of spleen compatible with old granulomatous disease. Negative for splenic enlargement. Adrenals/Urinary Tract: Normal adrenal glands. Normal urinary bladder. Normal appearance of both kidneys without hydronephrosis. Stomach/Bowel: Small hiatal hernia. The cecum is located in the left upper abdomen  and markedly distended, measuring close to 10 cm. There is mesenteric inflammation and marked narrowing of the colon in right abdomen on sequence 2, image 56. Right colon is completely decompressed on sequence 2 image 53. This pattern of disease is suggestive for cecal volvulus. Normal appearance of the stomach and small bowel. Vascular/Lymphatic:  Atherosclerotic disease in the abdominal aorta without aneurysm. The celiac trunk and SMA are patent. Venous structures are unremarkable. No lymph node enlargement in the abdomen or pelvis. Reproductive: Status post hysterectomy. No adnexal masses. Other: Small amount of ascites in the pelvis. Negative for free air. Musculoskeletal: No acute bone abnormality. IMPRESSION: 1. Cecal volvulus. Cecum is markedly distended in the left upper quadrant with volvulus in the right abdomen as described. Small amount of pelvic ascites. No evidence for free air. 2. Fluid attenuating structure in right epicardial fat region. Findings are nonspecific but could represent a pericardial cyst. 3. Evidence for old granulomatous disease. 4. Small hiatal hernia. 5.  Aortic Atherosclerosis (ICD10-I70.0). These results were called by telephone at the time of interpretation on 10/10/2018 at 9:41 am to Dr. Pricilla Loveless , who verbally acknowledged these results. Electronically Signed   By: Richarda Overlie M.D.   On: 10/10/2018 09:45    Anti-infectives: Anti-infectives (From admission, onward)   Start     Dose/Rate Route Frequency Ordered Stop   10/10/18 1215  gentamicin (GARAMYCIN) 290 mg in dextrose 5 % 100 mL IVPB     5 mg/kg  58.3 kg (Adjusted) 214.5 mL/hr over 30 Minutes Intravenous  Once 10/10/18 1201 10/10/18 1404   10/10/18 1200  clindamycin (CLEOCIN) IVPB 900 mg     900 mg 100 mL/hr over 30 Minutes Intravenous  Once 10/10/18 1159 10/10/18 1415   10/10/18 1200  gentamicin (GARAMYCIN) IVPB 100 mg  Status:  Discontinued     100 mg 200 mL/hr over 30 Minutes Intravenous  Once 10/10/18 1159 10/10/18 1201   10/10/18 1045  cefoTEtan (CEFOTAN) 2 g in sodium chloride 0.9 % 100 mL IVPB  Status:  Discontinued     2 g 200 mL/hr over 30 Minutes Intravenous Every 12 hours 10/10/18 1040 10/10/18 1159       Assessment/Plan HLD   Cecal Volvulus  S/p open right hemicolectomy with reanastomosis, Dr. Dwain Sarna, 3/2 - Advance diet to  fulls - Mobilize and IS - Repeat labs in AM - Remove honeycomb POD 4  FEN - Fulls; K 4.3 VTE - SCD, Lovenox, Mobilize  Foley - Removed POD 1, 3/3 ID - Gentamicin and Clindamycin periop (3/2); WBC 3/3 13.4 F/u - Dr. Dwain Sarna, date TBD   LOS: 1 day    Jacinto Halim , [facility] 10/11/2018, 8:59 AM Pager: [phone]

## 2018-10-12 LAB — CBC
HEMATOCRIT: 33.5 % — AB (ref 36.0–46.0)
Hemoglobin: 10.3 g/dL — ABNORMAL LOW (ref 12.0–15.0)
MCH: 29.9 pg (ref 26.0–34.0)
MCHC: 30.7 g/dL (ref 30.0–36.0)
MCV: 97.4 fL (ref 80.0–100.0)
Platelets: 251 10*3/uL (ref 150–400)
RBC: 3.44 MIL/uL — ABNORMAL LOW (ref 3.87–5.11)
RDW: 14.6 % (ref 11.5–15.5)
WBC: 14.2 10*3/uL — ABNORMAL HIGH (ref 4.0–10.5)
nRBC: 0 % (ref 0.0–0.2)

## 2018-10-12 MED ORDER — DOCUSATE SODIUM 100 MG PO CAPS
100.0000 mg | ORAL_CAPSULE | Freq: Two times a day (BID) | ORAL | Status: DC
  Administered 2018-10-12 – 2018-10-14 (×5): 100 mg via ORAL
  Filled 2018-10-12 (×6): qty 1

## 2018-10-12 MED ORDER — METHOCARBAMOL 1000 MG/10ML IJ SOLN
500.0000 mg | Freq: Three times a day (TID) | INTRAVENOUS | Status: DC
  Administered 2018-10-12 – 2018-10-15 (×9): 500 mg via INTRAVENOUS
  Filled 2018-10-12: qty 500
  Filled 2018-10-12: qty 5
  Filled 2018-10-12 (×3): qty 500
  Filled 2018-10-12 (×2): qty 5
  Filled 2018-10-12 (×4): qty 500
  Filled 2018-10-12: qty 5
  Filled 2018-10-12: qty 500

## 2018-10-12 MED ORDER — METHOCARBAMOL 1000 MG/10ML IJ SOLN
500.0000 mg | Freq: Three times a day (TID) | INTRAVENOUS | Status: DC | PRN
  Filled 2018-10-12: qty 5

## 2018-10-12 MED ORDER — POLYETHYLENE GLYCOL 3350 17 G PO PACK
17.0000 g | PACK | Freq: Every day | ORAL | Status: DC
  Administered 2018-10-12 – 2018-10-13 (×2): 17 g via ORAL
  Filled 2018-10-12 (×3): qty 1

## 2018-10-12 MED ORDER — SIMETHICONE 80 MG PO CHEW
80.0000 mg | CHEWABLE_TABLET | Freq: Four times a day (QID) | ORAL | Status: DC | PRN
  Administered 2018-10-13: 80 mg via ORAL
  Filled 2018-10-12: qty 1

## 2018-10-12 NOTE — Progress Notes (Signed)
2 Days Post-Op  Subjective: CC: Abdominal Soreness Patient reports she did well overnight. Some abdominal soreness over midline wound when lying still in bed. Has abdominal pain with movement (walking, getting dressed etc) that makes her nauseated. Feels pain is tolerable with oral pain medications. No nausea associated with eating. Tolerating FLD. No emesis. Denies flatus or BM. Was OOB in chair most of day yesterday. Mobilizing and using IS.   Patient had Spo2 on vitals reading 88%. No chest pain or sob. This was retaken with tech in room, continued to read 88%. A new monitor was used and read 99%. Lungs CTA b/l.   Objective: Vital signs in last 24 hours: Temp:  [98.1 F (36.7 C)-98.8 F (37.1 C)] 98.8 F (37.1 C) (03/04 0655) Pulse Rate:  [81-98] 90 (03/04 0801) Resp:  [18] 18 (03/04 0655) BP: (100-144)/(64-80) 144/70 (03/04 0655) SpO2:  [88 %-99 %] 99 % (03/04 0801)    Intake/Output from previous day: 03/03 0701 - 03/04 0700 In: 1812.2 [P.O.:1680; I.V.:132.2] Out: 2750 [Urine:2750] Intake/Output this shift: No intake/output data recorded.  PE: Gen: Awake and alert, NAD, resting comfortably in bed Heart: RRR Lungs: CTA b/l, normal efffort.  Abd: Soft, ND, appropriate amount of tenderness around midline wound. Honeycomb dressing in place over midline wound with some dried blood at base. Appears c/d/i beneath. +BS. Some ecchymosis LLQ, no induration or fluctuance.  Msk: No edema  Lab Results:  Recent Labs    10/11/18 0500 10/12/18 0449  WBC 13.4* 14.2*  HGB 11.7* 10.3*  HCT 39.2 33.5*  PLT 257 251   BMET Recent Labs    10/10/18 0607 10/11/18 0500  NA 138 136  K 4.3 4.3  CL 107 108  CO2 23 21*  GLUCOSE 124* 128*  BUN 10 7*  CREATININE 0.92 0.61  CALCIUM 9.0 7.6*   PT/INR No results for input(s): LABPROT, INR in the last 72 hours. CMP     Component Value Date/Time   NA 136 10/11/2018 0500   K 4.3 10/11/2018 0500   CL 108 10/11/2018 0500   CO2 21  (L) 10/11/2018 0500   GLUCOSE 128 (H) 10/11/2018 0500   BUN 7 (L) 10/11/2018 0500   CREATININE 0.61 10/11/2018 0500   CALCIUM 7.6 (L) 10/11/2018 0500   PROT 7.6 10/10/2018 0607   ALBUMIN 4.5 10/10/2018 0607   AST 19 10/10/2018 0607   ALT 12 10/10/2018 0607   ALKPHOS 44 10/10/2018 0607   BILITOT 0.4 10/10/2018 0607   GFRNONAA >60 10/11/2018 0500   GFRAA >60 10/11/2018 0500   Lipase     Component Value Date/Time   LIPASE 59 (H) 10/10/2018 0607   Urinalysis    Component Value Date/Time   COLORURINE YELLOW 10/10/2018 0748   APPEARANCEUR CLOUDY (A) 10/10/2018 0748   LABSPEC 1.026 10/10/2018 0748   PHURINE 5.0 10/10/2018 0748   GLUCOSEU NEGATIVE 10/10/2018 0748   HGBUR SMALL (A) 10/10/2018 0748   BILIRUBINUR NEGATIVE 10/10/2018 0748   KETONESUR 20 (A) 10/10/2018 0748   PROTEINUR 30 (A) 10/10/2018 0748   NITRITE NEGATIVE 10/10/2018 0748   LEUKOCYTESUR NEGATIVE 10/10/2018 0748  Patients UA in ED with many bacteria but noted to be dirty catch. No nitrites or Leukocytes. Denies any urinary symptoms currently. No further workup indicated.    Studies/Results: Ct Abdomen Pelvis W Contrast  Result Date: 10/10/2018 CLINICAL DATA:  73 year old with acute abdominal pain. EXAM: CT ABDOMEN AND PELVIS WITH CONTRAST TECHNIQUE: Multidetector CT imaging of the abdomen and pelvis was  performed using the standard protocol following bolus administration of intravenous contrast. CONTRAST:  ISOVUE-300 IOPAMIDOL (ISOVUE-300) INJECTION 61% COMPARISON:  Abdominal ultrasound 07/21/2018 appears FINDINGS: Lower chest: 6 mm calcified granuloma in the right middle lobe. There is a calcification in the right hilum. Findings compatible with old granulomatous disease. Small amount of bronchiectasis in the lingula. Dependent atelectasis at the lung bases. No large pleural effusions. Small hiatal hernia. Mildly lobulated low-density structure in the right epicardial fat. This structure roughly measures 4.2 x 2.1  cm on sequence 2, image 8. Attenuation of this structure is water density. Hepatobiliary: Normal appearance of the liver, gallbladder and portal venous system. No biliary dilatation Pancreas: Unremarkable. No pancreatic ductal dilatation or surrounding inflammatory changes. Spleen: Calcifications of spleen compatible with old granulomatous disease. Negative for splenic enlargement. Adrenals/Urinary Tract: Normal adrenal glands. Normal urinary bladder. Normal appearance of both kidneys without hydronephrosis. Stomach/Bowel: Small hiatal hernia. The cecum is located in the left upper abdomen and markedly distended, measuring close to 10 cm. There is mesenteric inflammation and marked narrowing of the colon in right abdomen on sequence 2, image 56. Right colon is completely decompressed on sequence 2 image 53. This pattern of disease is suggestive for cecal volvulus. Normal appearance of the stomach and small bowel. Vascular/Lymphatic: Atherosclerotic disease in the abdominal aorta without aneurysm. The celiac trunk and SMA are patent. Venous structures are unremarkable. No lymph node enlargement in the abdomen or pelvis. Reproductive: Status post hysterectomy. No adnexal masses. Other: Small amount of ascites in the pelvis. Negative for free air. Musculoskeletal: No acute bone abnormality. IMPRESSION: 1. Cecal volvulus. Cecum is markedly distended in the left upper quadrant with volvulus in the right abdomen as described. Small amount of pelvic ascites. No evidence for free air. 2. Fluid attenuating structure in right epicardial fat region. Findings are nonspecific but could represent a pericardial cyst. 3. Evidence for old granulomatous disease. 4. Small hiatal hernia. 5.  Aortic Atherosclerosis (ICD10-I70.0). These results were called by telephone at the time of interpretation on 10/10/2018 at 9:41 am to Dr. Pricilla Loveless , who verbally acknowledged these results. Electronically Signed   By: Richarda Overlie M.D.   On:  10/10/2018 09:45    Anti-infectives: Anti-infectives (From admission, onward)   Start     Dose/Rate Route Frequency Ordered Stop   10/10/18 1215  gentamicin (GARAMYCIN) 290 mg in dextrose 5 % 100 mL IVPB     5 mg/kg  58.3 kg (Adjusted) 214.5 mL/hr over 30 Minutes Intravenous  Once 10/10/18 1201 10/10/18 1404   10/10/18 1200  clindamycin (CLEOCIN) IVPB 900 mg     900 mg 100 mL/hr over 30 Minutes Intravenous  Once 10/10/18 1159 10/10/18 1415   10/10/18 1200  gentamicin (GARAMYCIN) IVPB 100 mg  Status:  Discontinued     100 mg 200 mL/hr over 30 Minutes Intravenous  Once 10/10/18 1159 10/10/18 1201   10/10/18 1045  cefoTEtan (CEFOTAN) 2 g in sodium chloride 0.9 % 100 mL IVPB  Status:  Discontinued     2 g 200 mL/hr over 30 Minutes Intravenous Every 12 hours 10/10/18 1040 10/10/18 1159       Assessment/Plan  HLD  Cecal Volvulus  S/p open right hemicolectomy with reanastomosis, Dr. Dwain Sarna, 3/2 - POD 2 - Advance diet to soft - Mobilize and IS - Repeat labs in AM - Remove honeycomb POD 4  FEN - Soft, IVF; K 4.3 (3/3) VTE - SCD, Lovenox, Mobilize  Foley - Removed POD 1, 3/3 ID -  Gentamicin and Clindamycin periop (3/2); WBC 14.2, no additional abx indicated F/u - Dr. [place]ain [place], date TBD   LOS: 2 days    Jacinto HalimMichael M Maczis , [facility] 10/12/2018, 8:35 AM Pager: [phone]

## 2018-10-13 LAB — BASIC METABOLIC PANEL
Anion gap: 9 (ref 5–15)
BUN: 7 mg/dL — ABNORMAL LOW (ref 8–23)
CO2: 21 mmol/L — AB (ref 22–32)
Calcium: 8.2 mg/dL — ABNORMAL LOW (ref 8.9–10.3)
Chloride: 104 mmol/L (ref 98–111)
Creatinine, Ser: 0.78 mg/dL (ref 0.44–1.00)
GFR calc non Af Amer: >60 mL/min (ref >60)
Glucose, Bld: 90 mg/dL (ref 70–99)
Potassium: 3.4 mmol/L — ABNORMAL LOW (ref 3.5–5.1)
Sodium: 134 mmol/L — ABNORMAL LOW (ref 135–145)

## 2018-10-13 LAB — MAGNESIUM: Magnesium: 1.8 mg/dL (ref 1.7–2.4)

## 2018-10-13 MED ORDER — KCL IN DEXTROSE-NACL 20-5-0.45 MEQ/L-%-% IV SOLN
INTRAVENOUS | Status: DC
  Administered 2018-10-13 – 2018-10-14 (×2): via INTRAVENOUS
  Filled 2018-10-13 (×2): qty 1000

## 2018-10-13 MED ORDER — SIMVASTATIN 40 MG PO TABS
40.0000 mg | ORAL_TABLET | Freq: Every day | ORAL | Status: DC
  Administered 2018-10-13 – 2018-10-14 (×2): 40 mg via ORAL
  Filled 2018-10-13 (×2): qty 1

## 2018-10-13 MED ORDER — POTASSIUM CHLORIDE CRYS ER 20 MEQ PO TBCR
20.0000 meq | EXTENDED_RELEASE_TABLET | Freq: Two times a day (BID) | ORAL | Status: AC
  Administered 2018-10-13 – 2018-10-14 (×4): 20 meq via ORAL
  Filled 2018-10-13 (×4): qty 1

## 2018-10-13 MED ORDER — MAGNESIUM SULFATE 2 GM/50ML IV SOLN
2.0000 g | Freq: Once | INTRAVENOUS | Status: AC
  Administered 2018-10-13: 2 g via INTRAVENOUS
  Filled 2018-10-13: qty 50

## 2018-10-13 MED ORDER — SODIUM CHLORIDE 0.9 % IV SOLN
INTRAVENOUS | Status: DC
  Administered 2018-10-13: 09:00:00 via INTRAVENOUS

## 2018-10-13 NOTE — Progress Notes (Addendum)
3 Days Post-Op  Subjective: CC: Abdominal soreness  Patient reports pain and nausea with movement. This improves at rest and is slightly better than yesterday. Reports no appetite and did not eat dinner last night. Sipping on coffee currently. Slept most of the evening last night. No flatus or BM. Denies fever, chills, abdominal distension or emesis. Mobilizing and using IS. She reports she is trying to avoid narcotics as she thinks this is making her more nauseated and tired.   Objective: Vital signs in last 24 hours: Temp:  [98.1 F (36.7 C)-98.8 F (37.1 C)] 98.2 F (36.8 C) (03/05 0630) Pulse Rate:  [88-94] 89 (03/05 0630) Resp:  [18] 18 (03/05 0630) BP: (166-178)/(80-85) 178/84 (03/05 0630) SpO2:  [90 %-96 %] 96 % (03/05 0630)    Intake/Output from previous day: 03/04 0701 - 03/05 0700 In: 1260 [P.O.:1110; IV Piggyback:150] Out: 2700 [Urine:2700] Intake/Output this shift: No intake/output data recorded.  PE: Gen: Awake and alert, NAD, resting comfortably in bed Heart: RRR Lungs: CTA b/l, normal efffort.  Abd: Soft, ND, appropriate amount of tenderness around midline wound, epigastrium and in RLQ. Honeycomb dressing in place over midline wound with some dried blood at base. Appears c/d/i beneath. +BS. Some ecchymosis LLQ, no induration or fluctuance.  Msk: No edema  Lab Results:  Recent Labs    10/11/18 0500 10/12/18 0449  WBC 13.4* 14.2*  HGB 11.7* 10.3*  HCT 39.2 33.5*  PLT 257 251   BMET Recent Labs    10/11/18 0500 10/13/18 0419  NA 136 134*  K 4.3 3.4*  CL 108 104  CO2 21* 21*  GLUCOSE 128* 90  BUN 7* 7*  CREATININE 0.61 0.78  CALCIUM 7.6* 8.2*   PT/INR No results for input(s): LABPROT, INR in the last 72 hours. CMP     Component Value Date/Time   NA 134 (L) 10/13/2018 0419   K 3.4 (L) 10/13/2018 0419   CL 104 10/13/2018 0419   CO2 21 (L) 10/13/2018 0419   GLUCOSE 90 10/13/2018 0419   BUN 7 (L) 10/13/2018 0419   CREATININE 0.78  10/13/2018 0419   CALCIUM 8.2 (L) 10/13/2018 0419   PROT 7.6 10/10/2018 0607   ALBUMIN 4.5 10/10/2018 0607   AST 19 10/10/2018 0607   ALT 12 10/10/2018 0607   ALKPHOS 44 10/10/2018 0607   BILITOT 0.4 10/10/2018 0607   GFRNONAA >60 10/13/2018 0419   GFRAA >60 10/13/2018 0419   Lipase     Component Value Date/Time   LIPASE 59 (H) 10/10/2018 0607       Studies/Results: No results found.  Anti-infectives: Anti-infectives (From admission, onward)   Start     Dose/Rate Route Frequency Ordered Stop   10/10/18 1215  gentamicin (GARAMYCIN) 290 mg in dextrose 5 % 100 mL IVPB     5 mg/kg  58.3 kg (Adjusted) 214.5 mL/hr over 30 Minutes Intravenous  Once 10/10/18 1201 10/10/18 1404   10/10/18 1200  clindamycin (CLEOCIN) IVPB 900 mg     900 mg 100 mL/hr over 30 Minutes Intravenous  Once 10/10/18 1159 10/10/18 1415   10/10/18 1200  gentamicin (GARAMYCIN) IVPB 100 mg  Status:  Discontinued     100 mg 200 mL/hr over 30 Minutes Intravenous  Once 10/10/18 1159 10/10/18 1201   10/10/18 1045  cefoTEtan (CEFOTAN) 2 g in sodium chloride 0.9 % 100 mL IVPB  Status:  Discontinued     2 g 200 mL/hr over 30 Minutes Intravenous Every 12 hours 10/10/18 1040  10/10/18 1159       Assessment/Plan Cecal Volvulus  S/p open right hemicolectomy with reanastomosis, Dr. Dwain Sarna, 3/2 - POD 3, remove honeycomb dressing tomorrow - Await return of bowel function - Surgical pathology - small bowel, clinically volvulus  - Mobilize and IS - Remove honeycomb POD 4  HLD - Home Simvastatin   Elevated BP - No hx of HTN at home. IV PRN medications. Will need follow up with PCP after d/c   FEN -Fulls, IVF; K 3.4, Mg 1.8 > replace. Repeat labs in AM. (3/3); bowel regimen VTE -SCD, Lovenox, Mobilize  Foley - Removed POD 1, 3/3 ID -Gentamicin and Clindamycin periop (3/2); WBC 14.2 3/4, no additional abx indicated F/u - Dr. Dwain Sarna, date TBD   LOS: 3 days    Jacinto Halim , [facility] 10/13/2018, 9:03 AM Pager: [phone]

## 2018-10-13 NOTE — Care Management Important Message (Signed)
Important Message  Patient Details  Name: Sarah Pacheco MRN: [id number] Date of Birth: 30-Aug-1945   Medicare Important Message Given:  Yes    Keegan Ducey 10/13/2018, 9:27 AM

## 2018-10-14 LAB — BASIC METABOLIC PANEL
Anion gap: 12 (ref 5–15)
BUN: 12 mg/dL (ref 8–23)
CALCIUM: 8.2 mg/dL — AB (ref 8.9–10.3)
CO2: 17 mmol/L — AB (ref 22–32)
Chloride: 103 mmol/L (ref 98–111)
Creatinine, Ser: 0.83 mg/dL (ref 0.44–1.00)
GFR calc Af Amer: >60 mL/min (ref >60)
GFR calc non Af Amer: >60 mL/min (ref >60)
Glucose, Bld: 127 mg/dL — ABNORMAL HIGH (ref 70–99)
Potassium: 3.8 mmol/L (ref 3.5–5.1)
Sodium: 132 mmol/L — ABNORMAL LOW (ref 135–145)

## 2018-10-14 LAB — MAGNESIUM: Magnesium: 2.3 mg/dL (ref 1.7–2.4)

## 2018-10-14 MED ORDER — SIMETHICONE 80 MG PO CHEW
80.0000 mg | CHEWABLE_TABLET | Freq: Four times a day (QID) | ORAL | Status: DC
  Administered 2018-10-14 – 2018-10-15 (×3): 80 mg via ORAL
  Filled 2018-10-14 (×4): qty 1

## 2018-10-14 MED ORDER — PROMETHAZINE HCL 25 MG/ML IJ SOLN
12.5000 mg | Freq: Four times a day (QID) | INTRAMUSCULAR | Status: DC | PRN
  Administered 2018-10-14: 12.5 mg via INTRAVENOUS
  Filled 2018-10-14: qty 1

## 2018-10-14 NOTE — Plan of Care (Signed)
Patient in bed this morning. No complaints of pain but does endorse slight nausea. Will continue to monitor.

## 2018-10-14 NOTE — Progress Notes (Addendum)
4 Days Post-Op  Subjective: CC: Nausea Patient reports that she started passing flatus yesterday afternoon and with less pain. Feels more nauseated this morning. Not much of an appetite. Had some broth and cream of wheat yesterday. Burping. Denies distension. Mobilizing and using IS.   Objective: Vital signs in last 24 hours: Temp:  [97.5 F (36.4 C)-98.4 F (36.9 C)] 97.5 F (36.4 C) (03/06 0535) Pulse Rate:  [83-85] 83 (03/06 0535) Resp:  [18] 18 (03/06 0535) BP: (163-172)/(87-99) 171/99 (03/06 0535) SpO2:  [91 %-98 %] 92 % (03/06 0535)    Intake/Output from previous day: 03/05 0701 - 03/06 0700 In: 1783.1 [P.O.:740; I.V.:843.1; IV Piggyback:200] Out: 2500 [Urine:2500] Intake/Output this shift: Total I/O In: 100 [IV Piggyback:100] Out: -   PE: Heart: RRR Lungs: CTA b/l, normal efffort. Abd: Soft, ND, minimal but appropriate amount of generalized tenderness.Honeycomb dressing removed. Incisions c/d/i with staples in place. No wound seperation.+BS.Some ecchymosis LLQ, no induration or fluctuance. Msk: No edema  Lab Results:  Recent Labs    10/12/18 0449  WBC 14.2*  HGB 10.3*  HCT 33.5*  PLT 251   BMET Recent Labs    10/13/18 0419 10/14/18 0411  NA 134* 132*  K 3.4* 3.8  CL 104 103  CO2 21* 17*  GLUCOSE 90 127*  BUN 7* 12  CREATININE 0.78 0.83  CALCIUM 8.2* 8.2*   PT/INR No results for input(s): LABPROT, INR in the last 72 hours. CMP     Component Value Date/Time   NA 132 (L) 10/14/2018 0411   K 3.8 10/14/2018 0411   CL 103 10/14/2018 0411   CO2 17 (L) 10/14/2018 0411   GLUCOSE 127 (H) 10/14/2018 0411   BUN 12 10/14/2018 0411   CREATININE 0.83 10/14/2018 0411   CALCIUM 8.2 (L) 10/14/2018 0411   PROT 7.6 10/10/2018 0607   ALBUMIN 4.5 10/10/2018 0607   AST 19 10/10/2018 0607   ALT 12 10/10/2018 0607   ALKPHOS 44 10/10/2018 0607   BILITOT 0.4 10/10/2018 0607   GFRNONAA >60 10/14/2018 0411   GFRAA >60 10/14/2018 0411   Lipase       Component Value Date/Time   LIPASE 59 (H) 10/10/2018 0607       Studies/Results: No results found.  Anti-infectives: Anti-infectives (From admission, onward)   Start     Dose/Rate Route Frequency Ordered Stop   10/10/18 1215  gentamicin (GARAMYCIN) 290 mg in dextrose 5 % 100 mL IVPB     5 mg/kg  58.3 kg (Adjusted) 214.5 mL/hr over 30 Minutes Intravenous  Once 10/10/18 1201 10/10/18 1404   10/10/18 1200  clindamycin (CLEOCIN) IVPB 900 mg     900 mg 100 mL/hr over 30 Minutes Intravenous  Once 10/10/18 1159 10/10/18 1415   10/10/18 1200  gentamicin (GARAMYCIN) IVPB 100 mg  Status:  Discontinued     100 mg 200 mL/hr over 30 Minutes Intravenous  Once 10/10/18 1159 10/10/18 1201   10/10/18 1045  cefoTEtan (CEFOTAN) 2 g in sodium chloride 0.9 % 100 mL IVPB  Status:  Discontinued     2 g 200 mL/hr over 30 Minutes Intravenous Every 12 hours 10/10/18 1040 10/10/18 1159       Assessment/Plan  Cecal Volvulus  S/p open right hemicolectomy with reanastomosis, Dr. Dwain Sarna, 3/2 - POD 4, honeycomb dressing removed. Incisions c/d/i.  - Ileus improving. Scheduled simethicone, phenergan for refractory N/V - Surgical pathology - small bowel, clinically volvulus  - Mobilize and IS  HLD - Home Simvastatin  Elevated BP - No hx of HTN at home. IV PRN medications. Will need follow up with PCP after d/c   FEN -Fulls, IVF; K 3.8 > replace. Mg 2.3(3/3); bowel regimen VTE -SCD, Lovenox, Mobilize  Foley - Removed POD 1, 3/3 ID -Gentamicin and Clindamycin periop (3/2); VZC58.8 3/4, no additional abx indicated F/u - Dr. Dwain Sarna, date TBD   LOS: 4 days    Jacinto Halim , [facility] 10/14/2018, 9:10 AM Pager: [phone]

## 2018-10-15 MED ORDER — OXYCODONE HCL 5 MG PO TABS: 5.0000 mg | ORAL_TABLET | ORAL | 0 refills | Status: DC | PRN

## 2018-10-15 MED ORDER — FLUCONAZOLE 100 MG PO TABS
100.0000 mg | ORAL_TABLET | Freq: Once | ORAL | Status: DC
  Filled 2018-10-15: qty 1

## 2018-10-15 NOTE — Plan of Care (Signed)
Reviewed discharge instructions with patient; copy given. IV removed. Patient ready for discharge.  

## 2018-10-15 NOTE — Discharge Summary (Signed)
Physician Discharge Summary  Patient ID: Sarah Pacheco MRN: [id number] DOB/AGE: 03-21-1946 73 y.o.  Admit date: 10/10/2018 Discharge date: 10/15/2018  Admission Diagnoses: Cecal volvulus  Discharge Diagnoses:  Active Problems:   Cecal volvulus [facility])   Discharged Condition: Good Hospital Course: 73 yo healthy female admitted for cecal volvulus. Underwent open right colectomy.  Ileus resolved. Tolerating diet, having bowel function. Path is benign  Consults: none  Significant Diagnostic Studies: ct scan showing cecal volvulus  Treatments: right colectomy   Disposition: Discharge disposition: 01-Home or Self Care        Allergies as of 10/15/2018      Reactions   Atorvastatin Other (See Comments)   Cramps in legs   Codeine Nausea And Vomiting   unknown   Erythromycin Base Other (See Comments)   Sick on the stomach   Penicillin G Hives   Unknown Did it involve swelling of the face/tongue/throat, SOB, or low BP? no Did it involve sudden or severe rash/hives, skin peeling, or any reaction on the inside of your mouth or nose? yes Did you need to seek medical attention at a hospital or doctor's office? yes When did it last happen?2010 If all above answers are "NO", may proceed with cephalosporin use.      Medication List    TAKE these medications   acetaminophen 325 MG tablet Commonly known as:  TYLENOL Take 650 mg by mouth every 6 (six) hours as needed for mild pain or headache.   CO Q 10 PO Take 1 tablet by mouth daily.   estradiol 0.5 MG tablet Commonly known as:  ESTRACE Take 0.5 mg by mouth daily.   oxyCODONE 5 MG immediate release tablet Commonly known as:  Oxy IR/ROXICODONE Take 1 tablet (5 mg total) by mouth every 4 (four) hours as needed for moderate pain.   pantoprazole 40 MG tablet Commonly known as:  PROTONIX Take 1 tablet (40 mg total) by mouth daily.   PARoxetine 20 MG tablet Commonly known as:  PAXIL Take 20 mg by mouth daily.    phentermine 37.5 MG tablet Commonly known as:  ADIPEX-P Take 37.5 mg by mouth daily.   simvastatin 40 MG tablet Commonly known as:  ZOCOR Take 40 mg by mouth daily.      Follow-up Information    Emelia Loron, MD In 2 weeks.   Specialty:  General Surgery Contact information: [address] [place] [address] [phone]           Signed: Emelia Loron 10/15/2018, 2:24 PM

## 2018-10-15 NOTE — Plan of Care (Signed)
Patient lying in bed this morning; pain and nausea controlled. Anxious to go home. Will continue to monitor.

## 2018-10-15 NOTE — Discharge Instructions (Signed)
[facility]      [facility], [place] [phone]  OPEN ABDOMINAL SURGERY: POST OP INSTRUCTIONS  Always review your discharge instruction sheet given to you by the facility where your surgery was performed. Take 400 mg ibuprofen every 8 hours or 650 mg tylenol every 6 hours for next 72 hours. Supplement with narcotic pain medication you can pick up at pharmacy.  Use ice several times daily to incision IF YOU HAVE DISABILITY OR FAMILY LEAVE FORMS, YOU MUST BRING THEM TO THE OFFICE FOR PROCESSING.  PLEASE DO NOT GIVE THEM TO YOUR DOCTOR.  1. A prescription for pain medication may be given to you upon discharge.  Take your pain medication as prescribed, if needed.  If narcotic pain medicine is not needed, then you may take acetaminophen (Tylenol) or ibuprofen (Advil) as needed. 2. Take your usually prescribed medications unless otherwise directed. 3. If you need a refill on your pain medication, please contact your pharmacy. They will contact our office to request authorization.  Prescriptions will not be filled after 5pm or on week-ends. 4. You should follow a light diet the first few days after arrival home, such as soup and crackers, pudding, etc.unless your doctor has advised otherwise. A high-fiber, low fat diet can be resumed as tolerated.   Be sure to include lots of fluids daily. Most patients will experience some swelling and bruising on the chest and neck area.  Ice packs will help.  Swelling and bruising can take several days to resolve 5. Most patients will experience some swelling and bruising in the area of the incision. Ice pack will help. Swelling and bruising can take several days to resolve..  6. It is common to experience some constipation if taking pain medication after surgery.  Increasing fluid intake and taking a stool softener will usually help or prevent this problem from occurring.  A mild laxative (Milk of Magnesia or Miralax) should be taken according to  package directions if there are no bowel movements after 48 hours. 7.  You may have steri-strips (small skin tapes) in place directly over the incision.  These strips should be left on the skin for 7-10 days.  If your surgeon used skin glue on the incision, you may shower in 24 hours.  The glue will flake off over the next 2-3 weeks.  Any sutures or staples will be removed at the office during your follow-up visit. You may find that a light gauze bandage over your incision may keep your staples from being rubbed or pulled. You may shower and replace the bandage daily. 8. ACTIVITIES:  You may resume regular (light) daily activities beginning the next day--such as daily self-care, walking, climbing stairs--gradually increasing activities as tolerated.  You may have sexual intercourse when it is comfortable.  Refrain from any heavy lifting or straining until approved by your doctor. a. You may drive when you no longer are taking prescription pain medication, you can comfortably wear a seatbelt, and you can safely maneuver your car and apply brakes b. Return to Work: ___________________________________ 9. You should see your doctor in the office for a follow-up appointment approximately two weeks after your surgery.  Make sure that you call for this appointment within a day or two after you arrive home to insure a convenient appointment time. OTHER INSTRUCTIONS:  _____________________________________________________________ _____________________________________________________________  WHEN TO CALL YOUR DOCTOR: 1. Fever over 101.0 2. Inability to urinate 3. Nausea and/or vomiting 4. Extreme swelling or bruising 5. Continued bleeding from incision. 6.  Increased pain, redness, or drainage from the incision. 7. Difficulty swallowing or breathing 8. Muscle cramping or spasms. 9. Numbness or tingling in hands or feet or around lips.  The clinic staff is available to answer your questions during regular  business hours.  Please dont hesitate to call and ask to speak to one of the nurses if you have concerns.  For further questions, please visit [email or web address]

## 2018-10-15 NOTE — Progress Notes (Signed)
5 Days Post-Op   Subjective/Chief Complaint: Feels great, having flatus and bm tol fulls   Objective: Vital signs in last 24 hours: Temp:  [97.7 F (36.5 C)-98.4 F (36.9 C)] 97.7 F (36.5 C) (03/07 0521) Pulse Rate:  [77-93] 77 (03/07 0521) Resp:  [14-16] 14 (03/07 0521) BP: (145-155)/(74-91) 145/74 (03/07 0521) SpO2:  [96 %-97 %] 97 % (03/07 0521) Weight:  [71 kg] 71 kg (03/07 0521) Last BM Date: 10/14/18  Intake/Output from previous day: 03/06 0701 - 03/07 0700 In: 2010 [P.O.:660; I.V.:1200; IV Piggyback:150] Out: 205 [Urine:203; Stool:2] Intake/Output this shift: Total I/O In: 120 [P.O.:120] Out: 0   GI: soft approp tender wound clean bs present  Lab Results:  No results for input(s): WBC, HGB, HCT, PLT in the last 72 hours. BMET Recent Labs    10/13/18 0419 10/14/18 0411  NA 134* 132*  K 3.4* 3.8  CL 104 103  CO2 21* 17*  GLUCOSE 90 127*  BUN 7* 12  CREATININE 0.78 0.83  CALCIUM 8.2* 8.2*   PT/INR No results for input(s): LABPROT, INR in the last 72 hours. ABG No results for input(s): PHART, HCO3 in the last 72 hours.  Invalid input(s): PCO2, PO2  Studies/Results: No results found.  Anti-infectives: Anti-infectives (From admission, onward)   Start     Dose/Rate Route Frequency Ordered Stop   10/10/18 1215  gentamicin (GARAMYCIN) 290 mg in dextrose 5 % 100 mL IVPB     5 mg/kg  58.3 kg (Adjusted) 214.5 mL/hr over 30 Minutes Intravenous  Once 10/10/18 1201 10/10/18 1404   10/10/18 1200  clindamycin (CLEOCIN) IVPB 900 mg     900 mg 100 mL/hr over 30 Minutes Intravenous  Once 10/10/18 1159 10/10/18 1415   10/10/18 1200  gentamicin (GARAMYCIN) IVPB 100 mg  Status:  Discontinued     100 mg 200 mL/hr over 30 Minutes Intravenous  Once 10/10/18 1159 10/10/18 1201   10/10/18 1045  cefoTEtan (CEFOTAN) 2 g in sodium chloride 0.9 % 100 mL IVPB  Status:  Discontinued     2 g 200 mL/hr over 30 Minutes Intravenous Every 12 hours 10/10/18 1040 10/10/18 1159       Assessment/Plan: POD 5 S/p open right hemicolectomy for cecal volvulus, Dr. Dwain Sarna, 3/2 -ileus resolved, if tol diet home today -return this week for staple removal     Sarah Pacheco 10/15/2018

## 2018-10-19 ENCOUNTER — Emergency Department (HOSPITAL_COMMUNITY)
Admission: EM | Admit: 2018-10-19 | Discharge: 2018-10-19 | Disposition: A | Discharge status: Home / Self Care | Payer: Medicare Other | Source: Home / Self Care | Attending: Emergency Medicine | Admitting: Emergency Medicine

## 2018-10-19 ENCOUNTER — Other Ambulatory Visit: Payer: Self-pay

## 2018-10-19 ENCOUNTER — Emergency Department (HOSPITAL_COMMUNITY): Payer: Medicare Other

## 2018-10-19 ENCOUNTER — Encounter (HOSPITAL_COMMUNITY): Payer: Self-pay

## 2018-10-19 DIAGNOSIS — Z79899 Other long term (current) drug therapy: Secondary | ICD-10-CM | POA: Insufficient documentation

## 2018-10-19 DIAGNOSIS — K567 Ileus, unspecified: Secondary | ICD-10-CM | POA: Diagnosis not present

## 2018-10-19 DIAGNOSIS — K9189 Other postprocedural complications and disorders of digestive system: Principal | ICD-10-CM

## 2018-10-19 DIAGNOSIS — Z87891 Personal history of nicotine dependence: Secondary | ICD-10-CM

## 2018-10-19 DIAGNOSIS — R748 Abnormal levels of other serum enzymes: Secondary | ICD-10-CM

## 2018-10-19 DIAGNOSIS — E860 Dehydration: Secondary | ICD-10-CM | POA: Diagnosis not present

## 2018-10-19 LAB — CBC
HCT: 37 % (ref 36.0–46.0)
Hemoglobin: 11.8 g/dL — ABNORMAL LOW (ref 12.0–15.0)
MCH: 29.4 pg (ref 26.0–34.0)
MCHC: 31.9 g/dL (ref 30.0–36.0)
MCV: 92.3 fL (ref 80.0–100.0)
PLATELETS: 632 10*3/uL — AB (ref 150–400)
RBC: 4.01 MIL/uL (ref 3.87–5.11)
RDW: 14.6 % (ref 11.5–15.5)
WBC: 11.9 10*3/uL — AB (ref 4.0–10.5)
nRBC: 0 % (ref 0.0–0.2)

## 2018-10-19 LAB — URINALYSIS, ROUTINE W REFLEX MICROSCOPIC
Bilirubin Urine: NEGATIVE
Glucose, UA: NEGATIVE mg/dL
Ketones, ur: 20 mg/dL — AB
Leukocytes,Ua: NEGATIVE
Nitrite: NEGATIVE
Protein, ur: NEGATIVE mg/dL
Specific Gravity, Urine: 1.012 (ref 1.005–1.030)
pH: 5 (ref 5.0–8.0)

## 2018-10-19 LAB — COMPREHENSIVE METABOLIC PANEL
ALK PHOS: 59 U/L (ref 38–126)
ALT: 18 U/L (ref 0–44)
AST: 18 U/L (ref 15–41)
Albumin: 3.6 g/dL (ref 3.5–5.0)
Anion gap: 12 (ref 5–15)
BILIRUBIN TOTAL: 0.5 mg/dL (ref 0.3–1.2)
BUN: 16 mg/dL (ref 8–23)
CO2: 24 mmol/L (ref 22–32)
Calcium: 8.9 mg/dL (ref 8.9–10.3)
Chloride: 100 mmol/L (ref 98–111)
Creatinine, Ser: 0.81 mg/dL (ref 0.44–1.00)
GFR calc Af Amer: >60 mL/min (ref >60)
Glucose, Bld: 99 mg/dL (ref 70–99)
Potassium: 3.3 mmol/L — ABNORMAL LOW (ref 3.5–5.1)
Sodium: 136 mmol/L (ref 135–145)
Total Protein: 7.1 g/dL (ref 6.5–8.1)

## 2018-10-19 LAB — LIPASE, BLOOD: Lipase: 142 U/L — ABNORMAL HIGH (ref 11–51)

## 2018-10-19 MED ORDER — ACETAMINOPHEN 500 MG PO TABS
1000.0000 mg | ORAL_TABLET | Freq: Once | ORAL | Status: AC
  Administered 2018-10-19: 1000 mg via ORAL
  Filled 2018-10-19: qty 2

## 2018-10-19 MED ORDER — SODIUM CHLORIDE (PF) 0.9 % IJ SOLN
INTRAMUSCULAR | Status: AC
  Filled 2018-10-19: qty 50

## 2018-10-19 MED ORDER — IOPAMIDOL (ISOVUE-300) INJECTION 61%
100.0000 mL | Freq: Once | INTRAVENOUS | Status: AC | PRN
  Administered 2018-10-19: 100 mL via INTRAVENOUS

## 2018-10-19 MED ORDER — IOPAMIDOL (ISOVUE-300) INJECTION 61%
INTRAVENOUS | Status: AC
  Filled 2018-10-19: qty 100

## 2018-10-19 MED ORDER — IOHEXOL 300 MG/ML  SOLN
30.0000 mL | Freq: Once | INTRAMUSCULAR | Status: AC | PRN
  Administered 2018-10-19: 30 mL via ORAL

## 2018-10-19 MED ORDER — ONDANSETRON HCL 4 MG PO TABS: 4.0000 mg | ORAL_TABLET | Freq: Four times a day (QID) | ORAL | 0 refills | Status: DC | PRN

## 2018-10-19 MED ORDER — SODIUM CHLORIDE 0.9% FLUSH: 3.0000 mL | Freq: Once | INTRAVENOUS | Status: DC

## 2018-10-19 NOTE — ED Triage Notes (Signed)
Pt states she had surgery on 3/2, "my colon was twisted and they took it out". Pt states her dr wanted CT and blood work done today, as she's not doing as well as her dr anticipated. Pt states generalized abdominal pain.

## 2018-10-19 NOTE — ED Notes (Signed)
Pt is resting and appears comfortable.  She is drinking PO contrast.  She states she is not ready to provide urine sample.

## 2018-10-19 NOTE — ED Provider Notes (Signed)
[facility]-EMERGENCY DEPT Provider Note   CSN: [id number] Arrival date & time: 10/19/18  1432    History   Chief Complaint Chief Complaint  Patient presents with   Post-op Problem    HPI Sarah Pacheco is a 73 y.o. female who presents the emergency department for evaluation of her abdomen.  She had a cecal volvulus on 2 March with a repair by Dr. Dwain Sarna.  She has a current lower wound dehiscence which has been packed and is healing well.  She continues to have swelling and abdominal pain.  She was sent here for lab work and CT of the abdomen and pelvis to rule out intra-abdominal infection.  She denies fevers chills.  Passing gas and making bowel movement.     HPI  Past Medical History:  Diagnosis Date   Anxiety    Arthritis    Cataract    Chest pain    Depression    GERD (gastroesophageal reflux disease)    Hyperlipidemia    Hyperlipidemia with target LDL less than 70 10/25/2015    Patient Active Problem List   Diagnosis Date Noted   Cecal volvulus (HCC) 10/10/2018   Hyperlipidemia with target LDL less than 70 10/25/2015    Past Surgical History:  Procedure Laterality Date   APPENDECTOMY     CATARACT EXTRACTION, BILATERAL     COLONOSCOPY  2017   X2     colon polyps   LAPAROTOMY N/A 10/10/2018   Procedure: EXPLORATORY LAPAROTOMY RIGHT COLECTOMY;  Surgeon: Emelia Loron, MD;  Location: [facility];  Service: General;  Laterality: N/A;   VAGINAL HYSTERECTOMY  1973   AUB, fibroids     OB History    Gravida  2   Para  2   Term      Preterm      AB      Living  2     SAB      TAB      Ectopic      Multiple      Live Births  2            Home Medications    Prior to Admission medications   Medication Sig Start Date End Date Taking? Authorizing Provider  acetaminophen (TYLENOL) 325 MG tablet Take 650 mg by mouth every 6 (six) hours as needed for mild pain or headache.   Yes [provider]    Docusate Sodium (DOCU SOFT PO) Take 2 tablets by mouth daily as needed (constipation).   Yes [provider]  estradiol (ESTRACE) 0.5 MG tablet Take 0.5 mg by mouth daily. 10/16/15  Yes [provider]  nystatin (MYCOSTATIN) 100000 UNIT/ML suspension Take 5 mLs by mouth 4 (four) times daily. 10/17/18  Yes [provider]  pantoprazole (PROTONIX) 40 MG tablet Take 1 tablet (40 mg total) by mouth daily. 08/16/18  Yes Lynann Bologna, MD  PARoxetine (PAXIL) 20 MG tablet Take 20 mg by mouth daily.   Yes [provider]  simethicone (MYLICON) 80 MG chewable tablet Chew 80 mg by mouth every 6 (six) hours as needed for flatulence.   Yes [provider]  simvastatin (ZOCOR) 40 MG tablet Take 40 mg by mouth daily.   Yes [provider]  ondansetron (ZOFRAN) 4 MG tablet Take 1 tablet (4 mg total) by mouth 4 (four) times daily as needed for nausea or vomiting. 10/19/18   Arthor Captain, PA-C  oxyCODONE (OXY IR/ROXICODONE) 5 MG immediate release tablet  Take 1 tablet (5 mg total) by mouth every 4 (four) hours as needed for moderate pain. Patient not taking: Reported on 10/19/2018 10/15/18   Emelia Loron, MD    Family History Family History  Problem Relation Age of Onset   Heart attack Father 29       MI   Heart disease Father    Heart disease Sister    Heart attack Brother 64       MI   Heart attack Sister    Heart disease Sister    Colon cancer Sister 51   Colon polyps Sister    Heart disease Brother    Heart failure Brother    Colon cancer Brother 11   Colon polyps Brother    COPD Brother    Lung cancer Brother    Esophageal cancer Neg Hx    Rectal cancer Neg Hx    Stomach cancer Neg Hx     Social History Social History   Tobacco Use   Smoking status: Former Smoker    Packs/day: 0.50    Years: 15.00    Pack years: 7.50    Types: Cigarettes   Smokeless tobacco: Never Used   Tobacco comment: Quit around age 21   Substance Use Topics   Alcohol use: No   Drug use: No     Allergies   Atorvastatin; Codeine; Erythromycin base; and Penicillin g   Review of Systems Review of Systems  Ten systems reviewed and are negative for acute change, except as noted in the HPI.   Physical Exam Updated Vital Signs BP (!) 150/95 (BP Location: Left Arm)    Pulse 98    Temp 97.9 F (36.6 C) (Oral)    Resp 17    Ht  (1.6 m)    Wt 67.1 kg    SpO2 99%    BMI 26.22 kg/m   Physical Exam Vitals signs and nursing note reviewed.  Constitutional:      General: She is not in acute distress.    Appearance: She is well-developed. She is not diaphoretic.  HENT:     Head: Normocephalic and atraumatic.  Eyes:     General: No scleral icterus.    Conjunctiva/sclera: Conjunctivae normal.  Neck:     Musculoskeletal: Normal range of motion.  Cardiovascular:     Rate and Rhythm: Normal rate and regular rhythm.     Heart sounds: Normal heart sounds. No murmur. No friction rub. No gallop.   Pulmonary:     Effort: Pulmonary effort is normal. No respiratory distress.     Breath sounds: Normal breath sounds.  Abdominal:     General: Bowel sounds are normal. There is distension.     Palpations: Abdomen is soft. There is no mass.     Tenderness: There is abdominal tenderness. There is no guarding.     Comments: Healing midline surgical scar with about 4 cm of dehiscence at the cheerier aspect.  It appears to be well-healing, no evidence of infection  Skin:    General: Skin is warm and dry.  Neurological:     Mental Status: She is alert and oriented to person, place, and time.  Psychiatric:        Behavior: Behavior normal.      ED Treatments / Results  Labs (all labs ordered are listed, but only abnormal results are displayed) Labs Reviewed  LIPASE, BLOOD - Abnormal; Notable for the following components:      Result Value  Lipase 142 (*)    All other components within normal limits  COMPREHENSIVE  METABOLIC PANEL - Abnormal; Notable for the following components:   Potassium 3.3 (*)    All other components within normal limits  CBC - Abnormal; Notable for the following components:   WBC 11.9 (*)    Hemoglobin 11.8 (*)    Platelets 632 (*)    All other components within normal limits  URINALYSIS, ROUTINE W REFLEX MICROSCOPIC - Abnormal; Notable for the following components:   APPearance HAZY (*)    Hgb urine dipstick SMALL (*)    Ketones, ur 20 (*)    Bacteria, UA FEW (*)    All other components within normal limits    EKG None  Radiology Ct Abdomen Pelvis W Contrast  Result Date: 10/19/2018 CLINICAL DATA:  Abdominal pain, status post colon resection for cecal volvulus EXAM: CT ABDOMEN AND PELVIS WITH CONTRAST TECHNIQUE: Multidetector CT imaging of the abdomen and pelvis was performed using the standard protocol following bolus administration of intravenous contrast. CONTRAST:  ISOVUE-300 IOPAMIDOL (ISOVUE-300) INJECTION 61%, 30mL OMNIPAQUE IOHEXOL 300 MG/ML SOLN COMPARISON:  10/10/2018 FINDINGS: Lower chest: Linear/platelike scarring in the lingula and bilateral lower lobes. 2.8 x 4.4 cm fluid density lesion along the right juxta diaphragmatic region (series 3/image 12), benign. Hepatobiliary: Liver is within normal limits. Gallbladder is unremarkable. No intrahepatic or extrahepatic ductal dilatation. Pancreas: Within normal limits. Spleen: Calcified splenic granulomata. Adrenals/Urinary Tract: Adrenal glands are within normal limits. Kidneys are within normal limits. Mild fullness of the right renal collecting system. No frank hydronephrosis. Bladder is within normal limits. Stomach/Bowel: Stomach is within normal limits. Status post right hemicolectomy. Multiple mildly prominent loops of small bowel in the central abdomen, favoring adynamic small bowel ileus. No true focal transition. Sigmoid diverticulosis, without evidence of diverticulitis. Vascular/Lymphatic: No evidence of  abdominal aortic aneurysm. Atherosclerotic calcifications of the abdominal aorta and branch vessels. No suspicious abdominopelvic lymphadenopathy. Reproductive: Status post hysterectomy. No adnexal masses. Other: Small volume pelvic ascites. No free air. Musculoskeletal: Postsurgical changes along the midline anterior abdominal wall. Visualized osseous structures are within normal limits. IMPRESSION: Status post right hemicolectomy. Associated small volume pelvic ascites with postsurgical changes along the midline anterior abdominal wall. Multiple mildly prominent loops of small bowel, favoring adynamic (postoperative) small bowel ileus. Small bowel obstruction is considered unlikely. Sigmoid diverticulosis, without evidence of diverticulitis. Additional ancillary findings as above. Electronically Signed   By: Charline Bills M.D.   On: 10/19/2018 20:56    Procedures Procedures (including critical care time)  Medications Ordered in ED Medications  sodium chloride flush (NS) 0.9 % injection 3 mL (has no administration in time range)  iopamidol (ISOVUE-300) 61 % injection (has no administration in time range)  sodium chloride (PF) 0.9 % injection (has no administration in time range)  iohexol (OMNIPAQUE) 300 MG/ML solution 30 mL (30 mLs Oral Contrast Given 10/19/18 1820)  iopamidol (ISOVUE-300) 61 % injection 100 mL (100 mLs Intravenous Contrast Given 10/19/18 2027)  acetaminophen (TYLENOL) tablet 1,000 mg (1,000 mg Oral Given 10/19/18 2230)     Initial Impression / Assessment and Plan / ED Course  I have reviewed the triage vital signs and the nursing notes.  Pertinent labs & imaging results that were available during my care of the patient were reviewed by me and considered in my medical decision making (see chart for details).        73 year old female status post colectomy after acute cecal volvulus.  Potassium is slightly  low.  White blood cell count at 11.9.  Urine negative.  Lipase is  slightly elevated.  She has no hypotension fever.  She is well-appearing here without severe pain has only required Tylenol.  CT scan shows mottled small bowel ileus.  There is a fluid collection up near the diaphragm which appears to be benign per radiologic radiologist interpretation.  I discussed the case with Dr. gross who is on-call.  We both feel the patient is likely able to go home with bowel rest on liquid diet, continued Colace and she may also begin taking Metamucil.  I have ordered Zofran.  She does not appear to have any severe pain nausea vomiting here.  May follow closely with Dr. Dwain Sarna in the outpatient setting.  I discussed return precautions to include fever, severe pain, worsening distention, intractable vomiting.  She appears appropriate for discharge at this time  Final Clinical Impressions(s) / ED Diagnoses   Final diagnoses:  Ileus following gastrointestinal surgery (HCC)  Elevated lipase    ED Discharge Orders         Ordered    ondansetron (ZOFRAN) 4 MG tablet  4 times daily PRN     10/19/18 2240           Arthor Captain, PA-C 10/20/18 0010    Wynetta Fines, MD 10/20/18 2311

## 2018-10-19 NOTE — Discharge Instructions (Signed)
Get help right away if: You have severe abdominal pain or bloating. You cannot eat or drink without vomiting.

## 2018-10-19 NOTE — ED Notes (Signed)
S/p colectomy 9 days ago.  Went for a f/u appt today, states she was instructed to come to the ED d/t not feeling better.  Abdominal pain is not better, she becomes nauseous when she smells food causing decreased appetite.  She has an open incision with gauze in place.  Pt reports known infection in her incision.  No drainage noted.

## 2018-10-21 ENCOUNTER — Inpatient Hospital Stay (HOSPITAL_COMMUNITY)
Admission: AD | Admit: 2018-10-21 | Discharge: 2018-10-23 | DRG: 641 | Disposition: A | Discharge status: Home / Self Care | Payer: Medicare Other | Source: Ambulatory Visit | Attending: Surgery | Admitting: Surgery

## 2018-10-21 ENCOUNTER — Other Ambulatory Visit: Payer: Self-pay

## 2018-10-21 ENCOUNTER — Encounter (HOSPITAL_COMMUNITY): Payer: Self-pay | Admitting: *Deleted

## 2018-10-21 DIAGNOSIS — R627 Adult failure to thrive: Secondary | ICD-10-CM | POA: Diagnosis present

## 2018-10-21 DIAGNOSIS — Z8249 Family history of ischemic heart disease and other diseases of the circulatory system: Secondary | ICD-10-CM

## 2018-10-21 DIAGNOSIS — E860 Dehydration: Secondary | ICD-10-CM | POA: Diagnosis present

## 2018-10-21 DIAGNOSIS — Z6826 Body mass index (BMI) 26.0-26.9, adult: Secondary | ICD-10-CM | POA: Diagnosis not present

## 2018-10-21 DIAGNOSIS — Z9049 Acquired absence of other specified parts of digestive tract: Secondary | ICD-10-CM | POA: Diagnosis not present

## 2018-10-21 DIAGNOSIS — Z800 Family history of malignant neoplasm of digestive organs: Secondary | ICD-10-CM

## 2018-10-21 DIAGNOSIS — Z87891 Personal history of nicotine dependence: Secondary | ICD-10-CM

## 2018-10-21 DIAGNOSIS — Z825 Family history of asthma and other chronic lower respiratory diseases: Secondary | ICD-10-CM

## 2018-10-21 DIAGNOSIS — K567 Ileus, unspecified: Secondary | ICD-10-CM | POA: Diagnosis present

## 2018-10-21 DIAGNOSIS — Z8719 Personal history of other diseases of the digestive system: Secondary | ICD-10-CM | POA: Diagnosis not present

## 2018-10-21 DIAGNOSIS — K9189 Other postprocedural complications and disorders of digestive system: Secondary | ICD-10-CM

## 2018-10-21 MED ORDER — POTASSIUM CHLORIDE IN NACL 20-0.9 MEQ/L-% IV SOLN
INTRAVENOUS | Status: DC
  Administered 2018-10-21 – 2018-10-23 (×4): via INTRAVENOUS
  Filled 2018-10-21 (×4): qty 1000

## 2018-10-21 MED ORDER — PANTOPRAZOLE SODIUM 40 MG IV SOLR
40.0000 mg | Freq: Every day | INTRAVENOUS | Status: DC
  Administered 2018-10-21: 40 mg via INTRAVENOUS
  Filled 2018-10-21: qty 40

## 2018-10-21 MED ORDER — DIPHENHYDRAMINE HCL 50 MG/ML IJ SOLN
25.0000 mg | Freq: Four times a day (QID) | INTRAMUSCULAR | Status: DC | PRN
  Administered 2018-10-21: 25 mg via INTRAVENOUS
  Filled 2018-10-21: qty 1

## 2018-10-21 MED ORDER — ONDANSETRON HCL 4 MG/2ML IJ SOLN
4.0000 mg | Freq: Four times a day (QID) | INTRAMUSCULAR | Status: DC | PRN
  Administered 2018-10-22: 4 mg via INTRAVENOUS
  Filled 2018-10-21: qty 2

## 2018-10-21 MED ORDER — ONDANSETRON 4 MG PO TBDP: 4.0000 mg | ORAL_TABLET | Freq: Four times a day (QID) | ORAL | Status: DC | PRN

## 2018-10-21 MED ORDER — ENOXAPARIN SODIUM 40 MG/0.4ML ~~LOC~~ SOLN
40.0000 mg | SUBCUTANEOUS | Status: DC
  Administered 2018-10-21 – 2018-10-22 (×2): 40 mg via SUBCUTANEOUS
  Filled 2018-10-21 (×2): qty 0.4

## 2018-10-21 NOTE — H&P (Signed)
Sarah Pacheco is an 73 y.o. female.   Chief Complaint: nausea, abdominal pain postoperatively  HPI: 73 year old female status post emergency right hemicolectomy on October 10, 2022 cecal volvulus.  Postoperative course was unremarkable and she was discharged on October 15, 2018.  However at home she had persistent issues with nausea without vomiting and poor by mouth intake secondary to nausea.  He was seen in our office 2 days prior to this admission for these symptoms.  CT scan was obtained at that time which showed evidence of a mild ileus.  Lipase was mildly elevated at that time.  She was discharged home and had repeat lab work today.  This shows increase in lipase to 248.  Otherwise lab work unremarkable with normal white blood count and hemoglobin.  Renal function normal.  Albumin low at 1.4.  LFTs normal.  As below pancreas appeared normal on her CT scan. Evaluation today shows the patient continues to have ongoing severe nausea without vomiting.  Difficulty getting in much fluids due to nausea.  She does have several loose bowel movements a day typically after trying to drink or eat a little bit.  She has burning pain across her mid abdomen related to physical activity but better at rest. Denies fever or chills.  No shortness of breath.  Some generalized malaise and weakness.  Past Medical History:  Diagnosis Date  . Anxiety   . Arthritis   . Cataract   . Chest pain   . Depression   . GERD (gastroesophageal reflux disease)   . Hyperlipidemia   . Hyperlipidemia with target LDL less than 70 10/25/2015    Past Surgical History:  Procedure Laterality Date  . APPENDECTOMY    . CATARACT EXTRACTION, BILATERAL    . COLONOSCOPY  2017   X2     colon polyps  . LAPAROTOMY N/A 10/10/2018   Procedure: EXPLORATORY LAPAROTOMY RIGHT COLECTOMY;  Surgeon: Emelia Loron, MD;  Location: [facility];  Service: General;  Laterality: N/A;  . VAGINAL HYSTERECTOMY  1973   AUB, fibroids    Family History   Problem Relation Age of Onset  . Heart attack Father 28       MI  . Heart disease Father   . Heart disease Sister   . Heart attack Brother 59       MI  . Heart attack Sister   . Heart disease Sister   . Colon cancer Sister 98  . Colon polyps Sister   . Heart disease Brother   . Heart failure Brother   . Colon cancer Brother 85  . Colon polyps Brother   . COPD Brother   . Lung cancer Brother   . Esophageal cancer Neg Hx   . Rectal cancer Neg Hx   . Stomach cancer Neg Hx    Social History:  reports that she has quit smoking. Her smoking use included cigarettes. She has a 7.50 pack-year smoking history. She has never used smokeless tobacco. She reports that she does not drink alcohol or use drugs.  Allergies:  Allergies  Allergen Reactions  . Atorvastatin Other (See Comments)    Cramps in legs  . Codeine Nausea And Vomiting    unknown  . Erythromycin Base Other (See Comments)    Sick on the stomach  . Penicillin G Hives    Unknown Did it involve swelling of the face/tongue/throat, SOB, or low BP? no Did it involve sudden or severe rash/hives, skin peeling, or any reaction on the  inside of your mouth or nose? yes Did you need to seek medical attention at a hospital or doctor's office? yes When did it last happen?2010 If all above answers are "NO", may proceed with cephalosporin use.     No medications prior to admission.    Results for orders placed or performed during the hospital encounter of 10/19/18 (from the past 48 hour(s))  Lipase, blood     Status: Abnormal   Collection Time: 10/19/18  5:30 PM  Result Value Ref Range   Lipase 142 (H) 11 - 51 U/L    Comment: Performed at [facility], [address] [place], [place] [address]  Comprehensive metabolic panel     Status: Abnormal   Collection Time: 10/19/18  5:30 PM  Result Value Ref Range   Sodium 136 135 - 145 mmol/L   Potassium 3.3 (L) 3.5 - 5.1 mmol/L   Chloride 100 98 - 111  mmol/L   CO2 24 22 - 32 mmol/L   Glucose, Bld 99 70 - 99 mg/dL   BUN 16 8 - 23 mg/dL   Creatinine, Ser 0.62 0.44 - 1.00 mg/dL   Calcium 8.9 8.9 - 37.6 mg/dL   Total Protein 7.1 6.5 - 8.1 g/dL   Albumin 3.6 3.5 - 5.0 g/dL   AST 18 15 - 41 U/L   ALT 18 0 - 44 U/L   Alkaline Phosphatase 59 38 - 126 U/L   Total Bilirubin 0.5 0.3 - 1.2 mg/dL   GFR calc non Af Amer >60 >60 mL/min   GFR calc Af Amer >60 >60 mL/min   Anion gap 12 5 - 15    Comment: Performed at [facility], [address], [place], [place] [address]  CBC     Status: Abnormal   Collection Time: 10/19/18  5:30 PM  Result Value Ref Range   WBC 11.9 (H) 4.0 - 10.5 K/uL   RBC 4.01 3.87 - 5.11 MIL/uL   Hemoglobin 11.8 (L) 12.0 - 15.0 g/dL   HCT 17.6 16.0 - 73.7 %   MCV 92.3 80.0 - 100.0 fL   MCH 29.4 26.0 - 34.0 pg   MCHC 31.9 30.0 - 36.0 g/dL   RDW 10.6 26.9 - 48.5 %   Platelets 632 (H) 150 - 400 K/uL   nRBC 0.0 0.0 - 0.2 %    Comment: Performed at [facility], [address], [place], [place] [address]   Ct Abdomen Pelvis W Contrast  Result Date: 10/19/2018 CLINICAL DATA:  Abdominal pain, status post colon resection for cecal volvulus EXAM: CT ABDOMEN AND PELVIS WITH CONTRAST TECHNIQUE: Multidetector CT imaging of the abdomen and pelvis was performed using the standard protocol following bolus administration of intravenous contrast. CONTRAST:  ISOVUE-300 IOPAMIDOL (ISOVUE-300) INJECTION 61%, 66mL OMNIPAQUE IOHEXOL 300 MG/ML SOLN COMPARISON:  10/10/2018 FINDINGS: Lower chest: Linear/platelike scarring in the lingula and bilateral lower lobes. 2.8 x 4.4 cm fluid density lesion along the right juxta diaphragmatic region (series 3/image 12), benign. Hepatobiliary: Liver is within normal limits. Gallbladder is unremarkable. No intrahepatic or extrahepatic ductal dilatation. Pancreas: Within normal limits. Spleen: Calcified splenic granulomata. Adrenals/Urinary Tract: Adrenal glands are  within normal limits. Kidneys are within normal limits. Mild fullness of the right renal collecting system. No frank hydronephrosis. Bladder is within normal limits. Stomach/Bowel: Stomach is within normal limits. Status post right hemicolectomy. Multiple mildly prominent loops of small bowel in the central abdomen, favoring adynamic small bowel ileus. No true focal  transition. Sigmoid diverticulosis, without evidence of diverticulitis. Vascular/Lymphatic: No evidence of abdominal aortic aneurysm. Atherosclerotic calcifications of the abdominal aorta and branch vessels. No suspicious abdominopelvic lymphadenopathy. Reproductive: Status post hysterectomy. No adnexal masses. Other: Small volume pelvic ascites. No free air. Musculoskeletal: Postsurgical changes along the midline anterior abdominal wall. Visualized osseous structures are within normal limits. IMPRESSION: Status post right hemicolectomy. Associated small volume pelvic ascites with postsurgical changes along the midline anterior abdominal wall. Multiple mildly prominent loops of small bowel, favoring adynamic (postoperative) small bowel ileus. Small bowel obstruction is considered unlikely. Sigmoid diverticulosis, without evidence of diverticulitis. Additional ancillary findings as above. Electronically Signed   By: Charline Bills M.D.   On: 10/19/2018 20:56    Review of Systems  Constitutional: Positive for malaise/fatigue. Negative for chills and fever.  Respiratory: Negative for cough and shortness of breath.   Cardiovascular: Negative for chest pain.  Gastrointestinal: Positive for abdominal pain, diarrhea and nausea. Negative for blood in stool and vomiting.    There were no vitals taken for this visit. Physical Exam  Temperature 98.3 pulse 99 blood pressure 110/64 respirations 12 weight 143.6 pounds General: Alert, fatigued appearing Caucasian female, in no acute distress Skin: Warm and dry without rash or infection. HEENT: No  palpable masses or thyromegaly. Sclera nonicteric. Pupils equal round and reactive.  Lungs: Breath sounds clear and equal without increased work of breathing  Cardiovascular: Regular rate and rhythm without murmur. No JVD or edema. Peripheral pulses intact. Abdomen: minimal distention. Soft with appropriate incisional tenderness.. No masses palpable. No organomegaly. No palpable hernias. Midline wound with several centimeters of the lower portion packed open, no erythema or unusual drainage, slight exudate at the base of the open area. Extremities: No edema or joint swelling or deformity. No chronic venous stasis changes. Neurologic: Alert and fully oriented. Gait normal.  Assessment/Plan Recent postoperative from emergency right hemicolectomy for volvulus.  Failure to thrive at home with ongoing nausea, likely mild dehydration, ileus apparent on CT scan and moderately elevated lipase of uncertain etiology with pancreas appearing normal on CT.  She has had a recent gallbladder ultrasound negative.  Likely prolonged postoperative ileus.  I think she would benefit from IV hydration and short.  Of observation in the hospital to make sure that she begins to improve.  Mariella Saa, MD 10/21/2018, 4:58 PM

## 2018-10-22 LAB — CBC
HCT: 36.1 % (ref 36.0–46.0)
Hemoglobin: 10.9 g/dL — ABNORMAL LOW (ref 12.0–15.0)
MCH: 29.1 pg (ref 26.0–34.0)
MCHC: 30.2 g/dL (ref 30.0–36.0)
MCV: 96.5 fL (ref 80.0–100.0)
PLATELETS: 685 10*3/uL — AB (ref 150–400)
RBC: 3.74 MIL/uL — ABNORMAL LOW (ref 3.87–5.11)
RDW: 14.7 % (ref 11.5–15.5)
WBC: 10.9 10*3/uL — ABNORMAL HIGH (ref 4.0–10.5)
nRBC: 0 % (ref 0.0–0.2)

## 2018-10-22 LAB — BASIC METABOLIC PANEL
Anion gap: 13 (ref 5–15)
BUN: 10 mg/dL (ref 8–23)
CALCIUM: 8.2 mg/dL — AB (ref 8.9–10.3)
CO2: 20 mmol/L — ABNORMAL LOW (ref 22–32)
Chloride: 105 mmol/L (ref 98–111)
Creatinine, Ser: 0.78 mg/dL (ref 0.44–1.00)
GFR calc Af Amer: >60 mL/min (ref >60)
GLUCOSE: 83 mg/dL (ref 70–99)
Potassium: 3.8 mmol/L (ref 3.5–5.1)
Sodium: 138 mmol/L (ref 135–145)

## 2018-10-22 LAB — URINALYSIS, ROUTINE W REFLEX MICROSCOPIC
Bilirubin Urine: NEGATIVE
Glucose, UA: NEGATIVE mg/dL
Hgb urine dipstick: NEGATIVE
KETONES UR: 20 mg/dL — AB
Leukocytes,Ua: NEGATIVE
Nitrite: NEGATIVE
PROTEIN: NEGATIVE mg/dL
Specific Gravity, Urine: 1.02 (ref 1.005–1.030)
pH: 5 (ref 5.0–8.0)

## 2018-10-22 LAB — C DIFFICILE QUICK SCREEN W PCR REFLEX
C DIFFICILE (CDIFF) INTERP: NOT DETECTED
C Diff antigen: NEGATIVE
C Diff toxin: NEGATIVE

## 2018-10-22 LAB — LIPASE, BLOOD: LIPASE: 193 U/L — AB (ref 11–51)

## 2018-10-22 MED ORDER — DIPHENHYDRAMINE HCL 25 MG PO CAPS
25.0000 mg | ORAL_CAPSULE | Freq: Four times a day (QID) | ORAL | Status: DC | PRN
  Administered 2018-10-22: 25 mg via ORAL
  Filled 2018-10-22: qty 1

## 2018-10-22 MED ORDER — ACETAMINOPHEN 325 MG PO TABS
650.0000 mg | ORAL_TABLET | Freq: Four times a day (QID) | ORAL | Status: DC | PRN
  Administered 2018-10-22: 650 mg via ORAL
  Filled 2018-10-22 (×2): qty 2

## 2018-10-22 MED ORDER — PANTOPRAZOLE SODIUM 40 MG PO TBEC
40.0000 mg | DELAYED_RELEASE_TABLET | Freq: Every day | ORAL | Status: DC
  Administered 2018-10-22: 40 mg via ORAL
  Filled 2018-10-22: qty 1

## 2018-10-22 NOTE — Progress Notes (Signed)
   Subjective/Chief Complaint: Feels a little better today   Objective: Vital signs in last 24 hours: Temp:  [97.6 F (36.4 C)-98.5 F (36.9 C)] 98.4 F (36.9 C) (03/14 0603) Pulse Rate:  [77-91] 77 (03/14 0603) Resp:  [16] 16 (03/14 0603) BP: (125-149)/(57-71) 148/65 (03/14 0603) SpO2:  [97 %-100 %] 98 % (03/14 0603) Weight:  [67.1 kg] 67.1 kg (03/13 1943) Last BM Date: 10/21/18  Intake/Output from previous day: 03/13 0701 - 03/14 0700 In: 1052.3 [P.O.:180; I.V.:872.3] Out: 200 [Urine:200] Intake/Output this shift: No intake/output data recorded.  General appearance: alert and cooperative Resp: clear to auscultation bilaterally Cardio: regular rate and rhythm GI: soft, minimal tenderness. open wound with moderate drainage  Lab Results:  Recent Labs    10/19/18 1730 10/22/18 0616  WBC 11.9* 10.9*  HGB 11.8* 10.9*  HCT 37.0 36.1  PLT 632* 685*   BMET Recent Labs    10/19/18 1730 10/22/18 0616  NA 136 138  K 3.3* 3.8  CL 100 105  CO2 24 20*  GLUCOSE 99 83  BUN 16 10  CREATININE 0.81 0.78  CALCIUM 8.9 8.2*   PT/INR No results for input(s): LABPROT, INR in the last 72 hours. ABG No results for input(s): PHART, HCO3 in the last 72 hours.  Invalid input(s): PCO2, PO2  Studies/Results: No results found.  Anti-infectives: Anti-infectives (From admission, onward)   None      Assessment/Plan: s/p * No surgery found * Advance diet  Ambulate Check stool for c diff  LOS: 1 day    Chevis Pretty III 10/22/2018

## 2018-10-22 NOTE — Progress Notes (Signed)

## 2018-10-23 NOTE — Discharge Instructions (Signed)
[facility]      [facility] [phone]  OPEN ABDOMINAL SURGERY: POST OP INSTRUCTIONS  Always review your discharge instruction sheet given to you by the facility where your surgery was performed.  IF YOU HAVE DISABILITY OR FAMILY LEAVE FORMS, YOU MUST BRING THEM TO THE OFFICE FOR PROCESSING.  PLEASE DO NOT GIVE THEM TO YOUR DOCTOR.  1. A prescription for pain medication may be given to you upon discharge.  Take your pain medication as prescribed, if needed.  If narcotic pain medicine is not needed, then you may take acetaminophen (Tylenol) or ibuprofen (Advil) as needed. 2. Take your usually prescribed medications unless otherwise directed. 3. If you need a refill on your pain medication, please contact your pharmacy. They will contact our office to request authorization.  Prescriptions will not be filled after 5pm or on week-ends. 4. You should follow a light diet the first few days after arrival home, such as soup and crackers, pudding, etc.unless your doctor has advised otherwise. A high-fiber, low fat diet can be resumed as tolerated.   Be sure to include lots of fluids daily. Most patients will experience some swelling and bruising on the chest and neck area.  Ice packs will help.  Swelling and bruising can take several days to resolve 5. Most patients will experience some swelling and bruising in the area of the incision. Ice pack will help. Swelling and bruising can take several days to resolve..  6. It is common to experience some constipation if taking pain medication after surgery.  Increasing fluid intake and taking a stool softener will usually help or prevent this problem from occurring.  A mild laxative (Milk of Magnesia or Miralax) should be taken according to package directions if there are no bowel movements after 48 hours. 7.  You may have steri-strips (small skin tapes) in place directly over the incision.  These strips should be left on the skin for 7-10 days.  If your  surgeon used skin glue on the incision, you may shower in 24 hours.  The glue will flake off over the next 2-3 weeks.  Any sutures or staples will be removed at the office during your follow-up visit. You may find that a light gauze bandage over your incision may keep your staples from being rubbed or pulled. You may shower and replace the bandage daily. 8. ACTIVITIES:  You may resume regular (light) daily activities beginning the next day--such as daily self-care, walking, climbing stairs--gradually increasing activities as tolerated.  You may have sexual intercourse when it is comfortable.  Refrain from any heavy lifting or straining until approved by your doctor. a. You may drive when you no longer are taking prescription pain medication, you can comfortably wear a seatbelt, and you can safely maneuver your car and apply brakes b. Return to Work: ___________________________________ 9. You should see your doctor in the office for a follow-up appointment approximately two weeks after your surgery.  Make sure that you call for this appointment within a day or two after you arrive home to insure a convenient appointment time. OTHER INSTRUCTIONS:  _____________________________________________________________ _____________________________________________________________  WHEN TO CALL YOUR DOCTOR: 1. Fever over 101.0 2. Inability to urinate 3. Nausea and/or vomiting 4. Extreme swelling or bruising 5. Continued bleeding from incision. 6. Increased pain, redness, or drainage from the incision. 7. Difficulty swallowing or breathing 8. Muscle cramping or spasms. 9. Numbness or tingling in hands or feet or around lips.  The clinic staff is available to   answer your questions during regular business hours.  Please don't hesitate to call and ask to speak to one of the nurses if you have concerns.  For further questions, please visit [email or web address]   

## 2018-10-23 NOTE — Discharge Summary (Signed)
Physician Discharge Summary  Patient ID: Sarah Pacheco MRN: [id number] DOB/AGE: 71-Apr-1947 71 y.o.  Admit date: 10/21/2018 Discharge date: 10/23/2018  Admission Diagnoses:  Cecal volvulus s/p right hemicolectomy    Nausea/ vomiting    Mild dehydration    Post-operative ileus  Discharge Diagnoses:   same Active Problems:   Ileus, postoperative (HCC)   Postoperative ileus [facility])   Discharged Condition: good  Hospital Course: 73 year old female status post emergency right hemicolectomy on October 10, 2018 for cecal volvulus.  Postoperative course was unremarkable and she was discharged on October 15, 2018.  However at home she had persistent issues with nausea without vomiting and poor by mouth intake secondary to nausea.  She was seen in our office 2 days prior to this admission for these symptoms.  CT scan was obtained at that time which showed evidence of a mild ileus.  Lipase was mildly elevated at that time.  She was discharged home and had repeat lab work today.  This shows increase in lipase to 248.  Otherwise lab work unremarkable with normal white blood count and hemoglobin.  Renal function normal.  Albumin low at 1.4.  LFTs normal.  As below pancreas appeared normal on her CT scan. Evaluation 3/13 shows the patient continues to have ongoing severe nausea without vomiting.  Difficulty getting in much fluids due to nausea.  She does have several loose bowel movements a day typically after trying to drink or eat a little bit.  She has burning pain across her mid abdomen related to physical activity but better at rest. Denies fever or chills.  No shortness of breath.  Some generalized malaise and weakness. She was admitted for rehydration.  She is feeling much better and is tolerating a diet without much nausea.    Treatments: IV hydration  Discharge Exam: Blood pressure 130/80, pulse 79, temperature 98.2 F (36.8 C), temperature source Oral, resp. rate 18, height 5\' 3"  (1.6 m), weight 67.1  kg, SpO2 98 %. General appearance: alert, cooperative and no distress GI: soft, non-tender, + BS Incision - well-granulated 2 cm wound - down to fascia; minimal drainage  Disposition: Discharge disposition: 01-Home or Self Care       Discharge Instructions    Call MD for:  persistant nausea and vomiting   Complete by:  As directed    Call MD for:  redness, tenderness, or signs of infection (pain, swelling, redness, odor or green/yellow discharge around incision site)   Complete by:  As directed    Call MD for:  severe uncontrolled pain   Complete by:  As directed    Call MD for:  temperature >100.4   Complete by:  As directed    Diet general   Complete by:  As directed    Discharge wound care:   Complete by:  As directed    Daily wet to dry dressings to midline wound.   Driving Restrictions   Complete by:  As directed    Do not drive while taking pain medications   Increase activity slowly   Complete by:  As directed    May shower / Bathe   Complete by:  As directed      Allergies as of 10/23/2018      Reactions   Atorvastatin Other (See Comments)   Cramps in legs   Codeine Nausea And Vomiting   unknown   Erythromycin Base Other (See Comments)   Sick on the stomach   Penicillin G Hives  Unknown Did it involve swelling of the face/tongue/throat, SOB, or low BP? no Did it involve sudden or severe rash/hives, skin peeling, or any reaction on the inside of your mouth or nose? yes Did you need to seek medical attention at a hospital or doctor's office? yes When did it last happen?2010 If all above answers are "NO", may proceed with cephalosporin use.      Medication List    STOP taking these medications   oxyCODONE 5 MG immediate release tablet Commonly known as:  Oxy IR/ROXICODONE     TAKE these medications   acetaminophen 325 MG tablet Commonly known as:  TYLENOL Take 650 mg by mouth every 6 (six) hours as needed for mild pain or headache.   DOCU  SOFT PO Take 2 tablets by mouth daily as needed (constipation).   estradiol 0.5 MG tablet Commonly known as:  ESTRACE Take 0.5 mg by mouth daily.   nystatin 100000 UNIT/ML suspension Commonly known as:  MYCOSTATIN Take 5 mLs by mouth 4 (four) times daily.   ondansetron 4 MG tablet Commonly known as:  Zofran Take 1 tablet (4 mg total) by mouth 4 (four) times daily as needed for nausea or vomiting.   pantoprazole 40 MG tablet Commonly known as:  PROTONIX Take 1 tablet (40 mg total) by mouth daily.   PARoxetine 20 MG tablet Commonly known as:  PAXIL Take 20 mg by mouth at bedtime.   simethicone 80 MG chewable tablet Commonly known as:  MYLICON Chew 80 mg by mouth every 6 (six) hours as needed for flatulence.   simvastatin 40 MG tablet Commonly known as:  ZOCOR Take 40 mg by mouth every evening.            Discharge Care Instructions  (From admission, onward)         Start     Ordered   10/23/18 0000  Discharge wound care:    Comments:  Daily wet to dry dressings to midline wound.   10/23/18 0847         Follow-up Information    Emelia Loron, MD. Call in 1 week(s).   Specialty:  General Surgery Contact information: [address] [place] [address] [phone]           Signed: Wynona Luna 10/23/2018, 8:48 AM

## 2019-07-11 ENCOUNTER — Encounter (HOSPITAL_COMMUNITY): Payer: Self-pay

## 2019-07-11 ENCOUNTER — Other Ambulatory Visit: Payer: Self-pay

## 2019-07-11 ENCOUNTER — Encounter (HOSPITAL_COMMUNITY)
Admission: EM | Disposition: A | Discharge status: Home / Self Care | Payer: Self-pay | Source: Home / Self Care | Attending: Cardiology

## 2019-07-11 ENCOUNTER — Inpatient Hospital Stay (HOSPITAL_COMMUNITY)
Admission: EM | Admit: 2019-07-11 | Discharge: 2019-07-13 | DRG: 246 | Disposition: A | Discharge status: Home / Self Care | Payer: Medicare Other | Attending: Cardiology | Admitting: Cardiology

## 2019-07-11 DIAGNOSIS — Z888 Allergy status to other drugs, medicaments and biological substances status: Secondary | ICD-10-CM

## 2019-07-11 DIAGNOSIS — Z880 Allergy status to penicillin: Secondary | ICD-10-CM

## 2019-07-11 DIAGNOSIS — E876 Hypokalemia: Secondary | ICD-10-CM | POA: Diagnosis present

## 2019-07-11 DIAGNOSIS — M1990 Unspecified osteoarthritis, unspecified site: Secondary | ICD-10-CM | POA: Diagnosis present

## 2019-07-11 DIAGNOSIS — I462 Cardiac arrest due to underlying cardiac condition: Secondary | ICD-10-CM | POA: Diagnosis present

## 2019-07-11 DIAGNOSIS — R739 Hyperglycemia, unspecified: Secondary | ICD-10-CM | POA: Diagnosis present

## 2019-07-11 DIAGNOSIS — E785 Hyperlipidemia, unspecified: Secondary | ICD-10-CM | POA: Diagnosis present

## 2019-07-11 DIAGNOSIS — I2510 Atherosclerotic heart disease of native coronary artery without angina pectoris: Secondary | ICD-10-CM

## 2019-07-11 DIAGNOSIS — Z8249 Family history of ischemic heart disease and other diseases of the circulatory system: Secondary | ICD-10-CM

## 2019-07-11 DIAGNOSIS — I2111 ST elevation (STEMI) myocardial infarction involving right coronary artery: Secondary | ICD-10-CM | POA: Diagnosis not present

## 2019-07-11 DIAGNOSIS — E7800 Pure hypercholesterolemia, unspecified: Secondary | ICD-10-CM | POA: Diagnosis present

## 2019-07-11 DIAGNOSIS — R570 Cardiogenic shock: Secondary | ICD-10-CM | POA: Diagnosis present

## 2019-07-11 DIAGNOSIS — Z9861 Coronary angioplasty status: Secondary | ICD-10-CM

## 2019-07-11 DIAGNOSIS — D649 Anemia, unspecified: Secondary | ICD-10-CM | POA: Diagnosis present

## 2019-07-11 DIAGNOSIS — H269 Unspecified cataract: Secondary | ICD-10-CM | POA: Diagnosis present

## 2019-07-11 DIAGNOSIS — Z79899 Other long term (current) drug therapy: Secondary | ICD-10-CM

## 2019-07-11 DIAGNOSIS — Z955 Presence of coronary angioplasty implant and graft: Secondary | ICD-10-CM

## 2019-07-11 DIAGNOSIS — Z20828 Contact with and (suspected) exposure to other viral communicable diseases: Secondary | ICD-10-CM | POA: Diagnosis present

## 2019-07-11 DIAGNOSIS — I213 ST elevation (STEMI) myocardial infarction of unspecified site: Secondary | ICD-10-CM

## 2019-07-11 DIAGNOSIS — Z8371 Family history of colonic polyps: Secondary | ICD-10-CM | POA: Diagnosis not present

## 2019-07-11 DIAGNOSIS — Z800 Family history of malignant neoplasm of digestive organs: Secondary | ICD-10-CM | POA: Diagnosis not present

## 2019-07-11 DIAGNOSIS — F329 Major depressive disorder, single episode, unspecified: Secondary | ICD-10-CM | POA: Diagnosis present

## 2019-07-11 DIAGNOSIS — I340 Nonrheumatic mitral (valve) insufficiency: Secondary | ICD-10-CM | POA: Diagnosis not present

## 2019-07-11 DIAGNOSIS — I4901 Ventricular fibrillation: Secondary | ICD-10-CM | POA: Diagnosis present

## 2019-07-11 DIAGNOSIS — I4891 Unspecified atrial fibrillation: Secondary | ICD-10-CM | POA: Diagnosis present

## 2019-07-11 DIAGNOSIS — Z825 Family history of asthma and other chronic lower respiratory diseases: Secondary | ICD-10-CM

## 2019-07-11 DIAGNOSIS — F419 Anxiety disorder, unspecified: Secondary | ICD-10-CM | POA: Diagnosis present

## 2019-07-11 DIAGNOSIS — R7303 Prediabetes: Secondary | ICD-10-CM

## 2019-07-11 DIAGNOSIS — I2119 ST elevation (STEMI) myocardial infarction involving other coronary artery of inferior wall: Secondary | ICD-10-CM | POA: Diagnosis present

## 2019-07-11 DIAGNOSIS — I214 Non-ST elevation (NSTEMI) myocardial infarction: Secondary | ICD-10-CM | POA: Diagnosis present

## 2019-07-11 DIAGNOSIS — Z87891 Personal history of nicotine dependence: Secondary | ICD-10-CM

## 2019-07-11 DIAGNOSIS — Z885 Allergy status to narcotic agent status: Secondary | ICD-10-CM

## 2019-07-11 DIAGNOSIS — K219 Gastro-esophageal reflux disease without esophagitis: Secondary | ICD-10-CM | POA: Diagnosis present

## 2019-07-11 DIAGNOSIS — Z8719 Personal history of other diseases of the digestive system: Secondary | ICD-10-CM

## 2019-07-11 DIAGNOSIS — I252 Old myocardial infarction: Secondary | ICD-10-CM | POA: Diagnosis not present

## 2019-07-11 DIAGNOSIS — R001 Bradycardia, unspecified: Secondary | ICD-10-CM

## 2019-07-11 DIAGNOSIS — N179 Acute kidney failure, unspecified: Secondary | ICD-10-CM | POA: Diagnosis present

## 2019-07-11 DIAGNOSIS — Z801 Family history of malignant neoplasm of trachea, bronchus and lung: Secondary | ICD-10-CM

## 2019-07-11 HISTORY — PX: LEFT HEART CATH AND CORONARY ANGIOGRAPHY: CATH118249

## 2019-07-11 HISTORY — PX: CORONARY/GRAFT ACUTE MI REVASCULARIZATION: CATH118305

## 2019-07-11 HISTORY — PX: TEMPORARY PACEMAKER: CATH118268

## 2019-07-11 LAB — COMPREHENSIVE METABOLIC PANEL
ALT: 10 U/L (ref 0–44)
ALT: 30 U/L (ref 0–44)
AST: 16 U/L (ref 15–41)
AST: 44 U/L — ABNORMAL HIGH (ref 15–41)
Albumin: 3.2 g/dL — ABNORMAL LOW (ref 3.5–5.0)
Albumin: 3.3 g/dL — ABNORMAL LOW (ref 3.5–5.0)
Alkaline Phosphatase: 48 U/L (ref 38–126)
Alkaline Phosphatase: 54 U/L (ref 38–126)
Anion gap: 11 (ref 5–15)
Anion gap: 12 (ref 5–15)
BUN: 10 mg/dL (ref 8–23)
BUN: 12 mg/dL (ref 8–23)
CO2: 20 mmol/L — ABNORMAL LOW (ref 22–32)
CO2: 22 mmol/L (ref 22–32)
Calcium: 8.3 mg/dL — ABNORMAL LOW (ref 8.9–10.3)
Calcium: 7.7 mg/dL — ABNORMAL LOW (ref 8.9–10.3)
Chloride: 112 mmol/L — ABNORMAL HIGH (ref 98–111)
Chloride: 110 mmol/L (ref 98–111)
Creatinine, Ser: 1.25 mg/dL — ABNORMAL HIGH (ref 0.44–1.00)
Creatinine, Ser: 1.2 mg/dL — ABNORMAL HIGH (ref 0.44–1.00)
GFR calc Af Amer: 50 mL/min — ABNORMAL LOW (ref >60)
GFR calc Af Amer: 53 mL/min — ABNORMAL LOW (ref >60)
GFR calc non Af Amer: 43 mL/min — ABNORMAL LOW (ref >60)
GFR calc non Af Amer: 45 mL/min — ABNORMAL LOW (ref >60)
Glucose, Bld: 178 mg/dL — ABNORMAL HIGH (ref 70–99)
Glucose, Bld: 221 mg/dL — ABNORMAL HIGH (ref 70–99)
Potassium: 3.2 mmol/L — ABNORMAL LOW (ref 3.5–5.1)
Potassium: 3.5 mmol/L (ref 3.5–5.1)
Sodium: 143 mmol/L (ref 135–145)
Sodium: 144 mmol/L (ref 135–145)
Total Bilirubin: 0.4 mg/dL (ref 0.3–1.2)
Total Bilirubin: 0.5 mg/dL (ref 0.3–1.2)
Total Protein: 5.7 g/dL — ABNORMAL LOW (ref 6.5–8.1)
Total Protein: 5.8 g/dL — ABNORMAL LOW (ref 6.5–8.1)

## 2019-07-11 LAB — POCT I-STAT 7, (LYTES, BLD GAS, ICA,H+H)
Acid-base deficit: 6 mmol/L — ABNORMAL HIGH (ref 0.0–2.0)
Acid-base deficit: 6 mmol/L — ABNORMAL HIGH (ref 0.0–2.0)
Bicarbonate: 20 mmol/L (ref 20.0–28.0)
Bicarbonate: 21.3 mmol/L (ref 20.0–28.0)
Calcium, Ion: 1.1 mmol/L — ABNORMAL LOW (ref 1.15–1.40)
Calcium, Ion: 0.91 mmol/L — ABNORMAL LOW (ref 1.15–1.40)
HCT: 33 % — ABNORMAL LOW (ref 36.0–46.0)
HCT: 32 % — ABNORMAL LOW (ref 36.0–46.0)
Hemoglobin: 11.2 g/dL — ABNORMAL LOW (ref 12.0–15.0)
Hemoglobin: 10.9 g/dL — ABNORMAL LOW (ref 12.0–15.0)
O2 Saturation: 100 %
O2 Saturation: 100 %
Potassium: 3 mmol/L — ABNORMAL LOW (ref 3.5–5.1)
Potassium: 2.9 mmol/L — ABNORMAL LOW (ref 3.5–5.1)
Sodium: 141 mmol/L (ref 135–145)
Sodium: 134 mmol/L — ABNORMAL LOW (ref 135–145)
TCO2: 21 mmol/L — ABNORMAL LOW (ref 22–32)
TCO2: 23 mmol/L (ref 22–32)
pCO2 arterial: 40 mmHg (ref 32.0–48.0)
pCO2 arterial: 47.4 mmHg (ref 32.0–48.0)
pH, Arterial: 7.306 — ABNORMAL LOW (ref 7.350–7.450)
pH, Arterial: 7.261 — ABNORMAL LOW (ref 7.350–7.450)
pO2, Arterial: 365 mmHg — ABNORMAL HIGH (ref 83.0–108.0)
pO2, Arterial: 346 mmHg — ABNORMAL HIGH (ref 83.0–108.0)

## 2019-07-11 LAB — CBC WITH DIFFERENTIAL/PLATELET
Abs Immature Granulocytes: 0.13 10*3/uL — ABNORMAL HIGH (ref 0.00–0.07)
Basophils Absolute: 0.1 10*3/uL (ref 0.0–0.1)
Basophils Relative: 0 %
Eosinophils Absolute: 0.1 10*3/uL (ref 0.0–0.5)
Eosinophils Relative: 0 %
HCT: 34.7 % — ABNORMAL LOW (ref 36.0–46.0)
Hemoglobin: 10.9 g/dL — ABNORMAL LOW (ref 12.0–15.0)
Immature Granulocytes: 1 %
Lymphocytes Relative: 12 %
Lymphs Abs: 2.3 10*3/uL (ref 0.7–4.0)
MCH: 29.5 pg (ref 26.0–34.0)
MCHC: 31.4 g/dL (ref 30.0–36.0)
MCV: 93.8 fL (ref 80.0–100.0)
Monocytes Absolute: 0.7 10*3/uL (ref 0.1–1.0)
Monocytes Relative: 4 %
Neutro Abs: 15.3 10*3/uL — ABNORMAL HIGH (ref 1.7–7.7)
Neutrophils Relative %: 83 %
Platelets: 263 10*3/uL (ref 150–400)
RBC: 3.7 MIL/uL — ABNORMAL LOW (ref 3.87–5.11)
RDW: 13.3 % (ref 11.5–15.5)
WBC: 18.6 10*3/uL — ABNORMAL HIGH (ref 4.0–10.5)
nRBC: 0 % (ref 0.0–0.2)

## 2019-07-11 LAB — POCT I-STAT, CHEM 8
BUN: 12 mg/dL (ref 8–23)
Calcium, Ion: 1.19 mmol/L (ref 1.15–1.40)
Chloride: 111 mmol/L (ref 98–111)
Creatinine, Ser: 1.1 mg/dL — ABNORMAL HIGH (ref 0.44–1.00)
Glucose, Bld: 178 mg/dL — ABNORMAL HIGH (ref 70–99)
HCT: 32 % — ABNORMAL LOW (ref 36.0–46.0)
Hemoglobin: 10.9 g/dL — ABNORMAL LOW (ref 12.0–15.0)
Potassium: 3.2 mmol/L — ABNORMAL LOW (ref 3.5–5.1)
Sodium: 143 mmol/L (ref 135–145)
TCO2: 23 mmol/L (ref 22–32)

## 2019-07-11 LAB — SARS CORONAVIRUS 2 BY RT PCR (HOSPITAL ORDER, PERFORMED IN ~~LOC~~ HOSPITAL LAB): SARS Coronavirus 2: NEGATIVE

## 2019-07-11 LAB — HEMOGLOBIN A1C
Hgb A1c MFr Bld: 5.8 % — ABNORMAL HIGH (ref 4.8–5.6)
Mean Plasma Glucose: 119.76 mg/dL

## 2019-07-11 LAB — TROPONIN I (HIGH SENSITIVITY)
Troponin I (High Sensitivity): 15 ng/L (ref <18)
Troponin I (High Sensitivity): 147 ng/L (ref <18)

## 2019-07-11 LAB — POCT ACTIVATED CLOTTING TIME
Activated Clotting Time: 379 seconds
Activated Clotting Time: 241 seconds
Activated Clotting Time: 290 seconds
Activated Clotting Time: 235 seconds
Activated Clotting Time: 142 seconds

## 2019-07-11 LAB — PROTIME-INR
INR: 1.3 — ABNORMAL HIGH (ref 0.8–1.2)
INR: 1.4 — ABNORMAL HIGH (ref 0.8–1.2)
Prothrombin Time: 15.9 seconds — ABNORMAL HIGH (ref 11.4–15.2)
Prothrombin Time: 17.4 seconds — ABNORMAL HIGH (ref 11.4–15.2)

## 2019-07-11 LAB — CBC
HCT: 33.6 % — ABNORMAL LOW (ref 36.0–46.0)
Hemoglobin: 10.7 g/dL — ABNORMAL LOW (ref 12.0–15.0)
MCH: 29.6 pg (ref 26.0–34.0)
MCHC: 31.8 g/dL (ref 30.0–36.0)
MCV: 92.8 fL (ref 80.0–100.0)
Platelets: 304 10*3/uL (ref 150–400)
RBC: 3.62 MIL/uL — ABNORMAL LOW (ref 3.87–5.11)
RDW: 13.2 % (ref 11.5–15.5)
WBC: 6.1 10*3/uL (ref 4.0–10.5)
nRBC: 0 % (ref 0.0–0.2)

## 2019-07-11 LAB — LIPID PANEL
Cholesterol: 148 mg/dL (ref 0–200)
HDL: 58 mg/dL (ref >40)
LDL Cholesterol: 81 mg/dL (ref 0–99)
Total CHOL/HDL Ratio: 2.6 RATIO
Triglycerides: 45 mg/dL (ref <150)
VLDL: 9 mg/dL (ref 0–40)

## 2019-07-11 LAB — MRSA PCR SCREENING: MRSA by PCR: NEGATIVE

## 2019-07-11 LAB — POC SARS CORONAVIRUS 2 AG: SARS Coronavirus 2 Ag: NEGATIVE

## 2019-07-11 LAB — APTT: aPTT: >200 seconds (ref 24–36)

## 2019-07-11 SURGERY — CORONARY/GRAFT ACUTE MI REVASCULARIZATION
Anesthesia: LOCAL

## 2019-07-11 MED ORDER — SODIUM CHLORIDE 0.9% FLUSH: 3.0000 mL | INTRAVENOUS | Status: DC | PRN

## 2019-07-11 MED ORDER — SODIUM BICARBONATE 8.4 % IV SOLN
INTRAVENOUS | Status: DC | PRN
  Administered 2019-07-11 (×2): 50 meq via INTRAVENOUS

## 2019-07-11 MED ORDER — ROSUVASTATIN CALCIUM 20 MG PO TABS
40.0000 mg | ORAL_TABLET | Freq: Every day | ORAL | Status: DC
  Administered 2019-07-11 – 2019-07-12 (×2): 40 mg via ORAL
  Filled 2019-07-11 (×2): qty 1
  Filled 2019-07-11 (×2): qty 2

## 2019-07-11 MED ORDER — TICAGRELOR 90 MG PO TABS
90.0000 mg | ORAL_TABLET | Freq: Two times a day (BID) | ORAL | Status: DC
  Administered 2019-07-11 – 2019-07-13 (×4): 90 mg via ORAL
  Filled 2019-07-11 (×4): qty 1

## 2019-07-11 MED ORDER — ENOXAPARIN SODIUM 40 MG/0.4ML ~~LOC~~ SOLN: 40.0000 mg | SUBCUTANEOUS | Status: DC

## 2019-07-11 MED ORDER — NITROGLYCERIN 1 MG/10 ML FOR IR/CATH LAB
INTRA_ARTERIAL | Status: DC | PRN
  Administered 2019-07-11: 100 ug via INTRACORONARY

## 2019-07-11 MED ORDER — HEPARIN SODIUM (PORCINE) 1000 UNIT/ML IJ SOLN
INTRAMUSCULAR | Status: DC | PRN
  Administered 2019-07-11: 6000 [IU] via INTRAVENOUS
  Administered 2019-07-11: 3000 [IU] via INTRAVENOUS

## 2019-07-11 MED ORDER — MORPHINE SULFATE (PF) 4 MG/ML IV SOLN
4.0000 mg | Freq: Once | INTRAVENOUS | Status: AC
  Administered 2019-07-11: 4 mg via INTRAVENOUS
  Filled 2019-07-11: qty 1

## 2019-07-11 MED ORDER — ACETAMINOPHEN 325 MG PO TABS
650.0000 mg | ORAL_TABLET | ORAL | Status: DC | PRN
  Administered 2019-07-12 (×3): 650 mg via ORAL
  Filled 2019-07-11 (×3): qty 2

## 2019-07-11 MED ORDER — DOPAMINE-DEXTROSE 3.2-5 MG/ML-% IV SOLN
INTRAVENOUS | Status: AC
  Filled 2019-07-11: qty 250

## 2019-07-11 MED ORDER — NITROGLYCERIN 0.4 MG SL SUBL: 0.4000 mg | SUBLINGUAL_TABLET | SUBLINGUAL | Status: DC | PRN

## 2019-07-11 MED ORDER — METOPROLOL TARTRATE 12.5 MG HALF TABLET
12.5000 mg | ORAL_TABLET | Freq: Two times a day (BID) | ORAL | Status: DC
  Administered 2019-07-11: 12.5 mg via ORAL
  Filled 2019-07-11: qty 1

## 2019-07-11 MED ORDER — PAROXETINE HCL 20 MG PO TABS
20.0000 mg | ORAL_TABLET | Freq: Every day | ORAL | Status: DC
  Filled 2019-07-11 (×2): qty 1

## 2019-07-11 MED ORDER — SODIUM CHLORIDE 0.9 % IV SOLN
INTRAVENOUS | Status: AC
  Administered 2019-07-11: 17:00:00 via INTRAVENOUS

## 2019-07-11 MED ORDER — ASPIRIN EC 81 MG PO TBEC
81.0000 mg | DELAYED_RELEASE_TABLET | Freq: Every day | ORAL | Status: DC
  Administered 2019-07-12 – 2019-07-13 (×2): 81 mg via ORAL
  Filled 2019-07-11 (×3): qty 1

## 2019-07-11 MED ORDER — HEPARIN (PORCINE) IN NACL 1000-0.9 UT/500ML-% IV SOLN
INTRAVENOUS | Status: DC | PRN
  Administered 2019-07-11 (×2): 500 mL

## 2019-07-11 MED ORDER — EPINEPHRINE 1 MG/10ML IJ SOSY
PREFILLED_SYRINGE | INTRAMUSCULAR | Status: DC | PRN
  Administered 2019-07-11: 1 mg via INTRAVENOUS

## 2019-07-11 MED ORDER — ONDANSETRON HCL 4 MG/2ML IJ SOLN: 4.0000 mg | Freq: Four times a day (QID) | INTRAMUSCULAR | Status: DC | PRN

## 2019-07-11 MED ORDER — ENOXAPARIN SODIUM 40 MG/0.4ML ~~LOC~~ SOLN
40.0000 mg | SUBCUTANEOUS | Status: DC
  Administered 2019-07-12 – 2019-07-13 (×2): 40 mg via SUBCUTANEOUS
  Filled 2019-07-11 (×2): qty 0.4

## 2019-07-11 MED ORDER — DOPAMINE-DEXTROSE 3.2-5 MG/ML-% IV SOLN
INTRAVENOUS | Status: DC | PRN
  Administered 2019-07-11: 15 ug/kg/min via INTRAVENOUS

## 2019-07-11 MED ORDER — AMIODARONE LOAD VIA INFUSION
INTRAVENOUS | Status: DC | PRN
  Administered 2019-07-11: 150 mg via INTRAVENOUS

## 2019-07-11 MED ORDER — POTASSIUM CHLORIDE CRYS ER 20 MEQ PO TBCR
40.0000 meq | EXTENDED_RELEASE_TABLET | Freq: Once | ORAL | Status: AC
  Administered 2019-07-11: 40 meq via ORAL
  Filled 2019-07-11: qty 2

## 2019-07-11 MED ORDER — NITROGLYCERIN 1 MG/10 ML FOR IR/CATH LAB
INTRA_ARTERIAL | Status: AC
  Filled 2019-07-11: qty 10

## 2019-07-11 MED ORDER — TICAGRELOR 90 MG PO TABS
ORAL_TABLET | ORAL | Status: DC | PRN
  Administered 2019-07-11: 180 mg via ORAL

## 2019-07-11 MED ORDER — NOREPINEPHRINE BITARTRATE 1 MG/ML IV SOLN
INTRAVENOUS | Status: DC | PRN
  Administered 2019-07-11: 8 ug/min via INTRAVENOUS

## 2019-07-11 MED ORDER — ONDANSETRON HCL 4 MG/2ML IJ SOLN
INTRAMUSCULAR | Status: AC
  Administered 2019-07-11: 4 mg via INTRAVENOUS
  Filled 2019-07-11: qty 2

## 2019-07-11 MED ORDER — NOREPINEPHRINE 4 MG/250ML-% IV SOLN
INTRAVENOUS | Status: AC
  Filled 2019-07-11: qty 250

## 2019-07-11 MED ORDER — SODIUM CHLORIDE 0.9 % IV SOLN
INTRAVENOUS | Status: AC
  Administered 2019-07-11: 20:00:00 via INTRAVENOUS

## 2019-07-11 MED ORDER — SODIUM BICARBONATE 8.4 % IV SOLN
INTRAVENOUS | Status: AC
  Filled 2019-07-11: qty 50

## 2019-07-11 MED ORDER — SODIUM CHLORIDE 0.9 % IV SOLN: 250.0000 mL | INTRAVENOUS | Status: DC | PRN

## 2019-07-11 MED ORDER — TICAGRELOR 90 MG PO TABS
ORAL_TABLET | ORAL | Status: AC
  Filled 2019-07-11: qty 2

## 2019-07-11 MED ORDER — SODIUM CHLORIDE 0.9 % IV SOLN
INTRAVENOUS | Status: DC | PRN
  Administered 2019-07-11: 250 mL via INTRAVENOUS

## 2019-07-11 MED ORDER — IOHEXOL 350 MG/ML SOLN
INTRAVENOUS | Status: DC | PRN
  Administered 2019-07-11: 115 mL

## 2019-07-11 MED ORDER — SODIUM CHLORIDE 0.9% FLUSH
3.0000 mL | Freq: Two times a day (BID) | INTRAVENOUS | Status: DC
  Administered 2019-07-12 – 2019-07-13 (×3): 3 mL via INTRAVENOUS

## 2019-07-11 MED ORDER — SODIUM CHLORIDE 0.9% FLUSH
3.0000 mL | Freq: Two times a day (BID) | INTRAVENOUS | Status: DC
  Administered 2019-07-11 – 2019-07-13 (×3): 3 mL via INTRAVENOUS

## 2019-07-11 MED ORDER — HEPARIN SODIUM (PORCINE) 1000 UNIT/ML IJ SOLN
INTRAMUSCULAR | Status: AC
  Filled 2019-07-11: qty 1

## 2019-07-11 MED ORDER — LIDOCAINE HCL (PF) 1 % IJ SOLN
INTRAMUSCULAR | Status: AC
  Filled 2019-07-11: qty 30

## 2019-07-11 MED ORDER — ATROPINE SULFATE 1 MG/10ML IJ SOSY
PREFILLED_SYRINGE | INTRAMUSCULAR | Status: AC
  Filled 2019-07-11: qty 10

## 2019-07-11 MED ORDER — PANTOPRAZOLE SODIUM 40 MG PO TBEC
40.0000 mg | DELAYED_RELEASE_TABLET | Freq: Every day | ORAL | Status: DC
  Administered 2019-07-12 – 2019-07-13 (×2): 40 mg via ORAL
  Filled 2019-07-11 (×2): qty 1

## 2019-07-11 MED ORDER — ASPIRIN 81 MG PO CHEW: 81.0000 mg | CHEWABLE_TABLET | Freq: Every day | ORAL | Status: DC

## 2019-07-11 MED ORDER — LIDOCAINE HCL (PF) 1 % IJ SOLN
INTRAMUSCULAR | Status: DC | PRN
  Administered 2019-07-11: 15 mL

## 2019-07-11 MED ORDER — ONDANSETRON HCL 4 MG/2ML IJ SOLN
4.0000 mg | Freq: Once | INTRAMUSCULAR | Status: AC
  Administered 2019-07-11: 15:00:00 4 mg via INTRAVENOUS

## 2019-07-11 MED ORDER — HEPARIN (PORCINE) IN NACL 1000-0.9 UT/500ML-% IV SOLN
INTRAVENOUS | Status: AC
  Filled 2019-07-11: qty 1000

## 2019-07-11 MED ORDER — AMIODARONE HCL 150 MG/3ML IV SOLN
INTRAVENOUS | Status: AC
  Filled 2019-07-11: qty 3

## 2019-07-11 MED ORDER — MORPHINE SULFATE (PF) 2 MG/ML IV SOLN
2.0000 mg | INTRAVENOUS | Status: DC | PRN
  Administered 2019-07-11: 2 mg via INTRAVENOUS
  Filled 2019-07-11: qty 1

## 2019-07-11 MED ORDER — ESTRADIOL 1 MG PO TABS
0.5000 mg | ORAL_TABLET | Freq: Every day | ORAL | Status: DC
  Administered 2019-07-11 – 2019-07-13 (×3): 0.5 mg via ORAL
  Filled 2019-07-11 (×3): qty 0.5

## 2019-07-11 SURGICAL SUPPLY — 23 items
BALLN SAPPHIRE 2.5X12 (BALLOONS) ×2
BALLN SAPPHIRE ~~LOC~~ 2.75X12 (BALLOONS) ×2 IMPLANT
BALLN ~~LOC~~ EMERGE MR 2.75X30 (BALLOONS) ×2
BALLOON SAPPHIRE 2.5X12 (BALLOONS) ×1 IMPLANT
BALLOON ~~LOC~~ EMERGE MR 2.75X30 (BALLOONS) ×1 IMPLANT
CABLE ADAPT PACING TEMP 12FT (ADAPTER) ×2 IMPLANT
CATH DXT MULTI JL4 JR4 ANG PIG (CATHETERS) ×2 IMPLANT
CATH LAUNCHER 6FR AL.75 (CATHETERS) ×2 IMPLANT
CATH S G BIP PACING (CATHETERS) ×2 IMPLANT
CATH VISTA GUIDE 6FR JR4 (CATHETERS) ×2 IMPLANT
GUIDELINER 6F (CATHETERS) ×2 IMPLANT
KIT ENCORE 26 ADVANTAGE (KITS) ×2 IMPLANT
KIT HEART LEFT (KITS) ×2 IMPLANT
PACK CARDIAC CATHETERIZATION (CUSTOM PROCEDURE TRAY) ×2 IMPLANT
SHEATH PINNACLE 6F 10CM (SHEATH) ×4 IMPLANT
SLEEVE REPOSITIONING LENGTH 30 (MISCELLANEOUS) ×2 IMPLANT
STENT RESOLUTE ONYX 2.75X26 (Permanent Stent) ×2 IMPLANT
STENT RESOLUTE ONYX 2.75X38 (Permanent Stent) ×2 IMPLANT
TRANSDUCER W/STOPCOCK (MISCELLANEOUS) ×2 IMPLANT
TUBING CIL FLEX 10 FLL-RA (TUBING) ×2 IMPLANT
WIRE ASAHI PROWATER 180CM (WIRE) ×4 IMPLANT
WIRE EMERALD 3MM-J .035X150CM (WIRE) ×2 IMPLANT
WIRE HI TORQ BMW 190CM (WIRE) ×2 IMPLANT

## 2019-07-11 NOTE — ED Triage Notes (Signed)
Pt arrived via [facility] from home for chest heaviness that radiates to both arms bilat, it started at 1330 today. EMS gave ASA 324mg  and nitro 1mg . Pt is diaphretic. Pt having 10/10 arm pain.

## 2019-07-11 NOTE — H&P (Signed)
Cardiology Admission History and Physical:   Patient ID: Sarah Pacheco MRN: [id number]; DOB: July 27, 1946   Admission date: 07/11/2019  Primary Care Provider: Marin Comment, FNP Primary Cardiologist: No primary care provider on file. New Primary Electrophysiologist:  None   Chief Complaint:  Chest pain  Patient Profile:   Sarah Pacheco is a 73 y.o. female with history of HLD presents with acute inferior STEMI.  History of Present Illness:   Sarah Pacheco has a history of HLD, arthritis, anxiety and depression. Today she was shopping at [employer] and developed severe substernal chest pain radiating to both arms. She was nauseated, sweaty and SOB. She called her fiance and told him she thought she was having a heart attack. He called EMS and they we able to locate her and bring her to the ED. Initial Ecg by EMS showed slight ST elevation inferiorly but repeat in the ED showed diagnostic ST elevation for an inferior STEMI. She was given ASA, IV heparin, morphine and taken emergently to the cardiac cath lab.  Heart Pathway Score:     Past Medical History:  Diagnosis Date  . Anxiety   . Arthritis   . Cataract   . Chest pain   . Depression   . GERD (gastroesophageal reflux disease)   . Hyperlipidemia   . Hyperlipidemia with target LDL less than 70 10/25/2015    Past Surgical History:  Procedure Laterality Date  . APPENDECTOMY    . CATARACT EXTRACTION, BILATERAL    . COLONOSCOPY  2017   X2     colon polyps  . LAPAROTOMY N/A 10/10/2018   Procedure: EXPLORATORY LAPAROTOMY RIGHT COLECTOMY;  Surgeon: Emelia Loron, MD;  Location: [facility];  Service: General;  Laterality: N/A;  . VAGINAL HYSTERECTOMY  1973   AUB, fibroids     Medications Prior to Admission: Prior to Admission medications   Medication Sig Start Date End Date Taking? Authorizing Provider  acetaminophen (TYLENOL) 325 MG tablet Take 650 mg by mouth every 6 (six) hours as needed for mild pain or headache.    [provider]  Docusate Sodium (DOCU SOFT PO) Take 2 tablets by mouth daily as needed (constipation).    [provider]  estradiol (ESTRACE) 0.5 MG tablet Take 0.5 mg by mouth daily. 10/16/15   [provider]  nystatin (MYCOSTATIN) 100000 UNIT/ML suspension Take 5 mLs by mouth 4 (four) times daily. 10/17/18   [provider]  ondansetron (ZOFRAN) 4 MG tablet Take 1 tablet (4 mg total) by mouth 4 (four) times daily as needed for nausea or vomiting. 10/19/18   Arthor Captain, PA-C  pantoprazole (PROTONIX) 40 MG tablet Take 1 tablet (40 mg total) by mouth daily. 08/16/18   Lynann Bologna, MD  PARoxetine (PAXIL) 20 MG tablet Take 20 mg by mouth at bedtime.     [provider]  simethicone (MYLICON) 80 MG chewable tablet Chew 80 mg by mouth every 6 (six) hours as needed for flatulence.    [provider]  simvastatin (ZOCOR) 40 MG tablet Take 40 mg by mouth every evening.     [provider]     Allergies:    Allergies  Allergen Reactions  . Atorvastatin Other (See Comments)    Cramps in legs  . Codeine Nausea And Vomiting    unknown  . Erythromycin Base Other (See Comments)    Sick on the stomach  . Penicillin G Hives    Unknown Did it involve swelling of the face/tongue/throat,  SOB, or low BP? no Did it involve sudden or severe rash/hives, skin peeling, or any reaction on the inside of your mouth or nose? yes Did you need to seek medical attention at a hospital or doctor's office? yes When did it last happen?2010 If all above answers are "NO", may proceed with cephalosporin use.     Social History:   Social History   Socioeconomic History  . Marital status: Divorced    Spouse name: Not on file  . Number of children: Not on file  . Years of education: Not on file  . Highest education level: Not on file  Occupational History  . Not on file  Social Needs  . Financial resource strain: Not on file  . Food insecurity    Worry:  Not on file    Inability: Not on file  . Transportation needs    Medical: Not on file    Non-medical: Not on file  Tobacco Use  . Smoking status: Former Smoker    Packs/day: 0.50    Years: 15.00    Pack years: 7.50    Types: Cigarettes  . Smokeless tobacco: Never Used  . Tobacco comment: Quit around age 73  Substance and Sexual Activity  . Alcohol use: No  . Drug use: No  . Sexual activity: Not on file  Lifestyle  . Physical activity    Days per week: Not on file    Minutes per session: Not on file  . Stress: Not on file  Relationships  . Social Musicianconnections    Talks on phone: Not on file    Gets together: Not on file    Attends religious service: Not on file    Active member of club or organization: Not on file    Attends meetings of clubs or organizations: Not on file    Relationship status: Not on file  . Intimate partner violence    Fear of current or ex partner: Not on file    Emotionally abused: Not on file    Physically abused: Not on file    Forced sexual activity: Not on file  Other Topics Concern  . Not on file  Social History Narrative  . Not on file    Family History:   The patient's family history includes COPD in her brother; Colon cancer (age of onset: 3265) in her brother and sister; Colon polyps in her brother and sister; Heart attack in her sister; Heart attack (age of onset: 6559) in her brother; Heart attack (age of onset: 4872) in her father; Heart disease in her brother, father, sister, and sister; Heart failure in her brother; Lung cancer in her brother. There is no history of Esophageal cancer, Rectal cancer, or Stomach cancer.    ROS:  Please see the history of present illness.  All other ROS reviewed and negative.     Physical Exam/Data:   Vitals:   07/11/19 1616 07/11/19 1620 07/11/19 1625 07/11/19 1630  BP: 137/74 (!) 121/59 118/71 (!) 105/59  Pulse: (!) 136 (!) 140 (!) 145 (!) 163  Resp: 11 17 18 15   SpO2: 97% 98% 97% 97%  Weight:       Height:       No intake or output data in the 24 hours ending 07/11/19 1659 Last 3 Weights 07/11/2019 10/21/2018 10/19/2018  Weight (lbs) 140 lb 147 lb 15.9 oz 148 lb  Weight (kg) 63.504 kg 67.13 kg 67.132 kg     Body mass index is  25.61 kg/m.  General:  Well nourished, thin WF in marked distress HEENT: normal Lymph: no adenopathy Neck: no JVD Endocrine:  No thryomegaly Vascular: No carotid bruits; FA pulses 2+ bilaterally without bruits. Radial pulses are weak. Cardiac:  normal S1, S2; RRR; no murmur  Lungs:  clear to auscultation bilaterally, no wheezing, rhonchi or rales  Abd: soft, nontender, no hepatomegaly  Ext: no edema Musculoskeletal:  No deformities, BUE and BLE strength normal and equal Skin: warm and dry  Neuro:  CNs 2-12 intact, no focal abnormalities noted Psych:  Normal affect    EKG:  The ECG that was done today was personally reviewed and demonstrates NSR with inferior ST elevation c/w STEMI.  Relevant CV Studies: none  Laboratory Data:  High Sensitivity Troponin:   Recent Labs  Lab 07/11/19 1506  TROPONINIHS 15      Chemistry Recent Labs  Lab 07/11/19 1506 07/11/19 1509 07/11/19 1551 07/11/19 1617  NA 143 143 141 134*  K 3.2* 3.2* 3.0* 2.9*  CL 112* 111  --   --   CO2 20*  --   --   --   GLUCOSE 178* 178*  --   --   BUN 10 12  --   --   CREATININE 1.25* 1.10*  --   --   CALCIUM 8.3*  --   --   --   GFRNONAA 43*  --   --   --   GFRAA 50*  --   --   --   ANIONGAP 11  --   --   --     Recent Labs  Lab 07/11/19 1506  PROT 5.7*  ALBUMIN 3.2*  AST 16  ALT 10  ALKPHOS 48  BILITOT 0.4   Hematology Recent Labs  Lab 07/11/19 1506  07/11/19 1551 07/11/19 1617  WBC 6.1  --   --   --   RBC 3.62*  --   --   --   HGB 10.7*   < > 11.2* 10.9*  HCT 33.6*   < > 33.0* 32.0*  MCV 92.8  --   --   --   MCH 29.6  --   --   --   MCHC 31.8  --   --   --   RDW 13.2  --   --   --   PLT 304  --   --   --    < > = values in this interval not  displayed.   BNPNo results for input(s): BNP, PROBNP in the last 168 hours.  DDimer No results for input(s): DDIMER in the last 168 hours.   Radiology/Studies:  No results found.  Assessment and Plan:   1. Acute inferior STEMI. Given ASA and IV heparin in ED. Will proceed with emergent cardiac cath and revascularization with PCI. 2. Hypercholesterolemia. Will check lipid panel. On Zocor at home. Intolerant of lipitor in the past. Will switch to high dose Crestor 3. Anxiety/depression. On Paxil. Will continue.   Severity of Illness: The appropriate patient status for this patient is INPATIENT. Inpatient status is judged to be reasonable and necessary in order to provide the required intensity of service to ensure the patient's safety. The patient's presenting symptoms, physical exam findings, and initial radiographic and laboratory data in the context of their chronic comorbidities is felt to place them at high risk for further clinical deterioration. Furthermore, it is not anticipated that the patient will be medically stable for discharge from the  hospital within 2 midnights of admission. The following factors support the patient status of inpatient.   " The patient's presenting symptoms include crushing chest pain. " The worrisome physical exam findings include appears acutely ill. " The initial radiographic and laboratory data are worrisome because of ST elevation on Ecg. " The chronic co-morbidities include HLD, family history of CAD.   * I certify that at the point of admission it is my clinical judgment that the patient will require inpatient hospital care spanning beyond 2 midnights from the point of admission due to high intensity of service, high risk for further deterioration and high frequency of surveillance required.*    For questions or updates, please contact [facility] Please consult [email or web address] for contact info under        Signed, Peter [place], MD  07/11/2019  4:59 PM

## 2019-07-11 NOTE — ED Provider Notes (Signed)
[facility] Provider Note   CSN: [id number] Arrival date & time: 07/11/19  1420     History   Chief Complaint Chief Complaint  Patient presents with  . Chest Pain    HPI Sarah Pacheco is a 73 y.o. female.     Patient is a 73 year old female with history of hyperlipidemia, arthritis, anxiety, depression.  She presents today for evaluation of chest pain.  This began several hours prior to presentation.  She describes severe, crushing substernal pain radiating into her arms.  She feels nauseated, sweaty, and short of breath.  Patient was given aspirin by ambulance and transported here.  The history is provided by the patient.  Chest Pain Pain location:  Substernal area Pain quality: crushing and pressure   Pain radiates to:  L arm and R arm Pain severity:  Severe Onset quality:  Sudden Duration:  3 hours Timing:  Constant Progression:  Worsening Chronicity:  New Relieved by:  Nothing Worsened by:  Nothing Ineffective treatments:  None tried   Past Medical History:  Diagnosis Date  . Anxiety   . Arthritis   . Cataract   . Chest pain   . Depression   . GERD (gastroesophageal reflux disease)   . Hyperlipidemia   . Hyperlipidemia with target LDL less than 70 10/25/2015    Patient Active Problem List   Diagnosis Date Noted  . Ileus, postoperative (HCC) 10/21/2018  . Postoperative ileus (HCC) 10/21/2018  . Cecal volvulus (HCC) 10/10/2018  . Hyperlipidemia with target LDL less than 70 10/25/2015    Past Surgical History:  Procedure Laterality Date  . APPENDECTOMY    . CATARACT EXTRACTION, BILATERAL    . COLONOSCOPY  2017   X2     colon polyps  . LAPAROTOMY N/A 10/10/2018   Procedure: EXPLORATORY LAPAROTOMY RIGHT COLECTOMY;  Surgeon: Emelia LoronWakefield, Matthew, MD;  Location: [facility] ORS;  Service: General;  Laterality: N/A;  . VAGINAL HYSTERECTOMY  1973   AUB, fibroids     OB History    Gravida  2   Para  2   Term      Preterm      AB      Living  2     SAB      TAB      Ectopic      Multiple      Live Births  2            Home Medications    Prior to Admission medications   Medication Sig Start Date End Date Taking? Authorizing Provider  acetaminophen (TYLENOL) 325 MG tablet Take 650 mg by mouth every 6 (six) hours as needed for mild pain or headache.    [provider]  Docusate Sodium (DOCU SOFT PO) Take 2 tablets by mouth daily as needed (constipation).    [provider]  estradiol (ESTRACE) 0.5 MG tablet Take 0.5 mg by mouth daily. 10/16/15   [provider]  nystatin (MYCOSTATIN) 100000 UNIT/ML suspension Take 5 mLs by mouth 4 (four) times daily. 10/17/18   [provider]  ondansetron (ZOFRAN) 4 MG tablet Take 1 tablet (4 mg total) by mouth 4 (four) times daily as needed for nausea or vomiting. 10/19/18   Arthor CaptainHarris, Abigail, PA-C  pantoprazole (PROTONIX) 40 MG tablet Take 1 tablet (40 mg total) by mouth daily. 08/16/18   Lynann BolognaGupta, Rajesh, MD  PARoxetine (PAXIL) 20 MG tablet Take 20 mg by mouth at bedtime.  [provider]  simethicone (MYLICON) 80 MG chewable tablet Chew 80 mg by mouth every 6 (six) hours as needed for flatulence.    [provider]  simvastatin (ZOCOR) 40 MG tablet Take 40 mg by mouth every evening.     [provider]    Family History Family History  Problem Relation Age of Onset  . Heart attack Father 21       MI  . Heart disease Father   . Heart disease Sister   . Heart attack Brother 35       MI  . Heart attack Sister   . Heart disease Sister   . Colon cancer Sister 70  . Colon polyps Sister   . Heart disease Brother   . Heart failure Brother   . Colon cancer Brother 33  . Colon polyps Brother   . COPD Brother   . Lung cancer Brother   . Esophageal cancer Neg Hx   . Rectal cancer Neg Hx   . Stomach cancer Neg Hx     Social History Social History   Tobacco Use  . Smoking status: Former Smoker     Packs/day: 0.50    Years: 15.00    Pack years: 7.50    Types: Cigarettes  . Smokeless tobacco: Never Used  . Tobacco comment: Quit around age 81  Substance Use Topics  . Alcohol use: No  . Drug use: No     Allergies   Atorvastatin, Codeine, Erythromycin base, and Penicillin g   Review of Systems Review of Systems  Cardiovascular: Positive for chest pain.  All other systems reviewed and are negative.    Physical Exam Updated Vital Signs BP (!) 104/34   Pulse (!) 56   Resp (!) 28   Ht 5\' 2"  (1.575 m)   Wt 63.5 kg   SpO2 97%   BMI 25.61 kg/m   Physical Exam Vitals signs and nursing note reviewed.  Constitutional:      General: She is not in acute distress.    Appearance: She is well-developed. She is not diaphoretic.  HENT:     Head: Normocephalic and atraumatic.  Neck:     Musculoskeletal: Normal range of motion and neck supple.  Cardiovascular:     Rate and Rhythm: Normal rate and regular rhythm.     Heart sounds: No murmur. No friction rub. No gallop.   Pulmonary:     Effort: Pulmonary effort is normal. No respiratory distress.     Breath sounds: Normal breath sounds. No wheezing.  Abdominal:     General: Bowel sounds are normal. There is no distension.     Palpations: Abdomen is soft.     Tenderness: There is no abdominal tenderness.  Musculoskeletal: Normal range of motion.  Skin:    General: Skin is warm and dry.  Neurological:     Mental Status: She is alert and oriented to person, place, and time.      ED Treatments / Results  Labs (all labs ordered are listed, but only abnormal results are displayed) Labs Reviewed  SARS CORONAVIRUS 2 BY RT PCR ([facility])  COMPREHENSIVE METABOLIC PANEL  CBC WITH DIFFERENTIAL/PLATELET  PROTIME-INR  COMPREHENSIVE METABOLIC PANEL  LIPID PANEL  HEMOGLOBIN A1C  CBC  PROTIME-INR  APTT  POC SARS CORONAVIRUS 2 AG -  ED  POC SARS CORONAVIRUS 2 AG  TROPONIN I (HIGH  SENSITIVITY)  TROPONIN I (HIGH SENSITIVITY)    EKG  EKG Interpretation  Date/Time:  Tuesday July 11 2019 14:32:46 EST Ventricular Rate:  48 PR Interval:    QRS Duration: 84 QT Interval:  496 QTC Calculation: 444 R Axis:   70 Text Interpretation: Sinus bradycardia Inferior infarct, acute (RCA) Probable RV involvement, suggest recording right precordial leads >>> Acute MI <<< Confirmed by Geoffery Lyons ([id number]) on 07/11/2019 3:25:05 PM   Radiology No results found.  Procedures Procedures (including critical care time)  Medications Ordered in ED Medications  lidocaine (PF) (XYLOCAINE) 1 % injection (2 mLs  Given 07/11/19 1506)  Heparin (Porcine) in NaCl 1000-0.9 UT/500ML-% SOLN (500 mLs  Given 07/11/19 1506)  DOPamine (INTROPIN) 800 mg in dextrose 5 % 250 mL (3.2 mg/mL) infusion (10 mcg/kg/min  63.5 kg Intravenous Rate/Dose Change 07/11/19 1519)  amiodarone (NEXTERONE) 1.8 mg/mL load via infusion (150 mg Intravenous Given 07/11/19 1500)  0.9 %  sodium chloride infusion (250 mLs Intravenous New Bag/Given 07/11/19 1514)  heparin injection (6,000 Units Intravenous Given 07/11/19 1516)  ticagrelor (BRILINTA) tablet (180 mg Oral Given 07/11/19 1521)  morphine 4 MG/ML injection 4 mg (4 mg Intravenous Given 07/11/19 1442)  ondansetron (ZOFRAN) injection 4 mg (4 mg Intravenous Given 07/11/19 1442)  Heparin (Porcine) in NaCl 1000-0.9 UT/500ML-% SOLN (has no administration in time range)  lidocaine (PF) (XYLOCAINE) 1 % injection (has no administration in time range)     Initial Impression / Assessment and Plan / ED Course  I have reviewed the triage vital signs and the nursing notes.  Pertinent labs & imaging results that were available during my care of the patient were reviewed by me and considered in my medical decision making (see chart for details).  Patient brought by EMS for evaluation of chest pain.  The details are described in the HPI.  Patient seen immediately upon arrival to the  ED.  An EKG was obtained showing ST elevations in the inferior leads with reciprocal changes in 1 and aVL consistent with an inferior wall MI.  I immediately called a code STEMI and spoke and Dr. [place] arrived shortly afterward.  Patient given heparin, morphine, and started on oxygen.  Patient then transported to the Cath Lab for emergent intervention.  CRITICAL CARE Performed by: Geoffery Lyons Total critical care time: 35 minutes Critical care time was exclusive of separately billable procedures and treating other patients. Critical care was necessary to treat or prevent imminent or life-threatening deterioration. Critical care was time spent personally by me on the following activities: development of treatment plan with patient and/or surrogate as well as nursing, discussions with consultants, evaluation of patient's response to treatment, examination of patient, obtaining history from patient or surrogate, ordering and performing treatments and interventions, ordering and review of laboratory studies, ordering and review of radiographic studies, pulse oximetry and re-evaluation of patient's condition.   Final Clinical Impressions(s) / ED Diagnoses   Final diagnoses:  None    ED Discharge Orders    None       Geoffery Lyons, MD 07/11/19 1527

## 2019-07-12 ENCOUNTER — Encounter (HOSPITAL_COMMUNITY): Payer: Self-pay | Admitting: Cardiology

## 2019-07-12 ENCOUNTER — Inpatient Hospital Stay (HOSPITAL_COMMUNITY): Payer: Medicare Other

## 2019-07-12 DIAGNOSIS — I340 Nonrheumatic mitral (valve) insufficiency: Secondary | ICD-10-CM

## 2019-07-12 LAB — CBC
HCT: 31.2 % — ABNORMAL LOW (ref 36.0–46.0)
HCT: 31.3 % — ABNORMAL LOW (ref 36.0–46.0)
Hemoglobin: 9.8 g/dL — ABNORMAL LOW (ref 12.0–15.0)
Hemoglobin: 9.9 g/dL — ABNORMAL LOW (ref 12.0–15.0)
MCH: 29.4 pg (ref 26.0–34.0)
MCH: 29.6 pg (ref 26.0–34.0)
MCHC: 31.4 g/dL (ref 30.0–36.0)
MCHC: 31.6 g/dL (ref 30.0–36.0)
MCV: 93.7 fL (ref 80.0–100.0)
MCV: 93.7 fL (ref 80.0–100.0)
Platelets: 248 10*3/uL (ref 150–400)
Platelets: 254 10*3/uL (ref 150–400)
RBC: 3.33 MIL/uL — ABNORMAL LOW (ref 3.87–5.11)
RBC: 3.34 MIL/uL — ABNORMAL LOW (ref 3.87–5.11)
RDW: 13.4 % (ref 11.5–15.5)
RDW: 13.5 % (ref 11.5–15.5)
WBC: 9.3 10*3/uL (ref 4.0–10.5)
WBC: 9.6 10*3/uL (ref 4.0–10.5)
nRBC: 0 % (ref 0.0–0.2)
nRBC: 0 % (ref 0.0–0.2)

## 2019-07-12 LAB — BASIC METABOLIC PANEL
Anion gap: 6 (ref 5–15)
BUN: 9 mg/dL (ref 8–23)
CO2: 24 mmol/L (ref 22–32)
Calcium: 7.7 mg/dL — ABNORMAL LOW (ref 8.9–10.3)
Chloride: 111 mmol/L (ref 98–111)
Creatinine, Ser: 0.98 mg/dL (ref 0.44–1.00)
GFR calc Af Amer: >60 mL/min (ref >60)
GFR calc non Af Amer: 58 mL/min — ABNORMAL LOW (ref >60)
Glucose, Bld: 117 mg/dL — ABNORMAL HIGH (ref 70–99)
Potassium: 4.6 mmol/L (ref 3.5–5.1)
Sodium: 141 mmol/L (ref 135–145)

## 2019-07-12 LAB — ECHOCARDIOGRAM COMPLETE
Height: 62 in
Weight: 2240 oz

## 2019-07-12 LAB — LIPID PANEL
Cholesterol: 134 mg/dL (ref 0–200)
HDL: 56 mg/dL (ref >40)
LDL Cholesterol: 64 mg/dL (ref 0–99)
Total CHOL/HDL Ratio: 2.4 RATIO
Triglycerides: 70 mg/dL (ref <150)
VLDL: 14 mg/dL (ref 0–40)

## 2019-07-12 LAB — TROPONIN I (HIGH SENSITIVITY): Troponin I (High Sensitivity): 805 ng/L (ref <18)

## 2019-07-12 MED ORDER — METOPROLOL SUCCINATE ER 25 MG PO TB24
25.0000 mg | ORAL_TABLET | Freq: Every day | ORAL | Status: DC
  Administered 2019-07-12 – 2019-07-13 (×2): 25 mg via ORAL
  Filled 2019-07-12 (×2): qty 1

## 2019-07-12 MED ORDER — CHLORHEXIDINE GLUCONATE CLOTH 2 % EX PADS
6.0000 | MEDICATED_PAD | Freq: Every day | CUTANEOUS | Status: DC
  Administered 2019-07-12: 6 via TOPICAL

## 2019-07-12 MED ORDER — BUPROPION HCL ER (XL) 150 MG PO TB24
150.0000 mg | ORAL_TABLET | Freq: Every day | ORAL | Status: DC
  Administered 2019-07-12 – 2019-07-13 (×2): 150 mg via ORAL
  Filled 2019-07-12 (×2): qty 1

## 2019-07-12 NOTE — Progress Notes (Signed)
Progress Note  Patient Name: Sarah Pacheco Date of Encounter: 07/12/2019  Primary Cardiologist: No primary care provider on file. New - Dr [place]  Subjective   Feels washed out today but having no pain or dyspnea.   Inpatient Medications    Scheduled Meds: . aspirin EC  81 mg Oral Daily  . Chlorhexidine Gluconate Cloth  6 each Topical Daily  . enoxaparin (LOVENOX) injection  40 mg Subcutaneous Q24H  . estradiol  0.5 mg Oral Daily  . metoprolol succinate  25 mg Oral Daily  . pantoprazole  40 mg Oral Daily  . PARoxetine  20 mg Oral QHS  . rosuvastatin  40 mg Oral q1800  . sodium chloride flush  3 mL Intravenous Q12H  . sodium chloride flush  3 mL Intravenous Q12H  . ticagrelor  90 mg Oral BID   Continuous Infusions: . sodium chloride    . sodium chloride     PRN Meds: sodium chloride, sodium chloride, acetaminophen, nitroGLYCERIN, ondansetron (ZOFRAN) IV, sodium chloride flush, sodium chloride flush   Vital Signs    Vitals:   07/12/19 0354 07/12/19 0400 07/12/19 0500 07/12/19 0600  BP:  (!) 114/58 119/63 (!) 115/56  Pulse:  66 65 63  Resp:  15 11 12   Temp: 97.9 F (36.6 C)     TempSrc: Axillary     SpO2:  100% 100% 100%  Weight:      Height:        Intake/Output Summary (Last 24 hours) at 07/12/2019 0727 Last data filed at 07/12/2019 0200 Gross per 24 hour  Intake 1538.12 ml  Output 900 ml  Net 638.12 ml   Last 3 Weights 07/11/2019 10/21/2018 10/19/2018  Weight (lbs) 140 lb 147 lb 15.9 oz 148 lb  Weight (kg) 63.504 kg 67.13 kg 67.132 kg      Telemetry    NSR with PVCs. 4 beat run of VT - Personally Reviewed  ECG    NSR with T wave inversion inferiorly - Personally Reviewed  Physical Exam   GEN: No acute distress.   Neck: No JVD Cardiac: RRR, no murmurs, rubs, or gallops.  Respiratory: Clear to auscultation bilaterally. GI: Soft, nontender, non-distended  MS: No edema; No deformity. Right groin site without hematoma. Neuro:  Nonfocal  Psych:  Normal affect   Labs    High Sensitivity Troponin:   Recent Labs  Lab 07/11/19 1506 07/11/19 1708 07/12/19 0244  TROPONINIHS 15 147* 805*      Chemistry Recent Labs  Lab 07/11/19 1506 07/11/19 1509  07/11/19 1617 07/11/19 1708 07/12/19 0244  NA 143 143   < > 134* 144 141  K 3.2* 3.2*   < > 2.9* 3.5 4.6  CL 112* 111  --   --  110 111  CO2 20*  --   --   --  22 24  GLUCOSE 178* 178*  --   --  221* 117*  BUN 10 12  --   --  12 9  CREATININE 1.25* 1.10*  --   --  1.20* 0.98  CALCIUM 8.3*  --   --   --  7.7* 7.7*  PROT 5.7*  --   --   --  5.8*  --   ALBUMIN 3.2*  --   --   --  3.3*  --   AST 16  --   --   --  44*  --   ALT 10  --   --   --  30  --   ALKPHOS 48  --   --   --  54  --   BILITOT 0.4  --   --   --  0.5  --   GFRNONAA 43*  --   --   --  45* 58*  GFRAA 50*  --   --   --  53* >60  ANIONGAP 11  --   --   --  12 6   < > = values in this interval not displayed.     Hematology Recent Labs  Lab 07/11/19 1506  07/11/19 1617 07/11/19 1708 07/12/19 0244  WBC 6.1  --   --  18.6* 9.3  9.6  RBC 3.62*  --   --  3.70* 3.34*  3.33*  HGB 10.7*   < > 10.9* 10.9* 9.9*  9.8*  HCT 33.6*   < > 32.0* 34.7* 31.3*  31.2*  MCV 92.8  --   --  93.8 93.7  93.7  MCH 29.6  --   --  29.5 29.6  29.4  MCHC 31.8  --   --  31.4 31.6  31.4  RDW 13.2  --   --  13.3 13.4  13.5  PLT 304  --   --  263 254  248   < > = values in this interval not displayed.    BNPNo results for input(s): BNP, PROBNP in the last 168 hours.   DDimer No results for input(s): DDIMER in the last 168 hours.   Radiology    No results found.  Cardiac Studies   Procedures  CORONARY/GRAFT ACUTE MI REVASCULARIZATION  LEFT HEART CATH AND CORONARY ANGIOGRAPHY  TEMPORARY PACEMAKER  Conclusion    Prox RCA to Mid RCA lesion is 100% stenosed.  Post intervention, there is a 0% residual stenosis.  A drug-eluting stent was successfully placed using a STENT RESOLUTE ONYX [id number].  A drug-eluting  stent was successfully placed using a STENT RESOLUTE ONYX [id number].  The left ventricular systolic function is normal.  LV end diastolic pressure is normal.  The left ventricular ejection fraction is 55-65% by visual estimate.   1. 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 Low LVEDP 3. Normal LV function 4. Successful PCI of the RCA with DES x 2. 5. Resolution of cardiogenic shock and bradycardia.  Plan: DAPT for one year. Monitor in ICU. High dose statin. Will initiate beta blocker as BP allows.       Patient Profile     73 y.o. female with history of HLD and family history of CAD presents with acute inferior STEMI complicated by Vfib arrest and cardiogenic shock.  Assessment & Plan    1. Acute inferior STEMI. Peak troponin 805. Cardiac cath findings of occluded mid RCA. Had Vfib arrest x 2 in the cath lab requiring defibrillation. Also had cardiogenic shock and bradycardia in the cath lab that resolved after successful reperfusion. RCA treated with DES x 2. Looks great today. NSR. Ecg shows resolution of ST elevation. Continue DAPT for one year. Will start Toprol XL 25 mg daily. High dose statin with Crestor.  2. Cardiogenic shock. Resolved post reperfusion 3. Vfib arrest x 2- resolved. 4. AFib resolved. 5. Hypercholesterolemia. Intolerant to lipitor in the past. Has been on Zocor 40 mg daily. Will switch to Crestor 40 mg daily  6. Hypokalemia resolved.  Will transfer to telemetry today. Check Echo.   For questions or updates, please contact [facility]  Please consult [email or web address] for contact info under        Signed, Aamani Moose [place], MD  07/12/2019, 7:27 AM

## 2019-07-12 NOTE — Progress Notes (Addendum)
CARDIAC REHAB PHASE I   PRE:  Rate/Rhythm: 62 SR    BP: sitting 119/62    SaO2: 97 RA  MODE:  Ambulation: 370 ft   POST:  Rate/Rhythm: 90 SR    BP: sitting 126/55     SaO2: 99 RA  Pt able to walk unit fairly independently. At end of walk she c/o being SOB, fairly suddenly. Resolved with rest. VSS. Discussed MI, stents, restrictions, Brilinta, diet, exercise, NTG and CRPII. Will refer to [place]. She understands Brilinta importance. Return to bed for rest. Can walk again later.  Pt is interested in participating in Virtual Cardiac and Pulmonary Rehab. Pt advised that Virtual Cardiac and Pulmonary Rehab is provided at no cost to the patient.  Checklist:  1. Pt has smart device  ie smartphone and/or ipad for downloading an app  Yes 2. Reliable internet/wifi service    Yes 3. Understands how to use their smartphone and navigate within an app.  Yes   Pt verbalized understanding and is in agreement.  [place], ACSM 07/12/2019 1:49 PM

## 2019-07-12 NOTE — Progress Notes (Signed)
  Echocardiogram 2D Echocardiogram has been performed.  Sarah Pacheco 07/12/2019, 11:17 AM

## 2019-07-12 NOTE — Progress Notes (Signed)
Arterial sheath pulled successfully, catheter intact. Insertion site soft, no hematoma, bruising or bleeding. Distal pulses palpable and strong.Patient denies any numbness nor tingling on the right lower extremity. Pressure manually held for 20 mins. Patient advised to lay flat for 6 hrs and keep the limb immobilized and to report any bleeding or tenderness on the site.

## 2019-07-13 DIAGNOSIS — I2510 Atherosclerotic heart disease of native coronary artery without angina pectoris: Secondary | ICD-10-CM

## 2019-07-13 DIAGNOSIS — E876 Hypokalemia: Secondary | ICD-10-CM

## 2019-07-13 DIAGNOSIS — N179 Acute kidney failure, unspecified: Secondary | ICD-10-CM

## 2019-07-13 DIAGNOSIS — R001 Bradycardia, unspecified: Secondary | ICD-10-CM

## 2019-07-13 DIAGNOSIS — Z9861 Coronary angioplasty status: Secondary | ICD-10-CM

## 2019-07-13 DIAGNOSIS — D649 Anemia, unspecified: Secondary | ICD-10-CM

## 2019-07-13 DIAGNOSIS — R7303 Prediabetes: Secondary | ICD-10-CM

## 2019-07-13 DIAGNOSIS — R570 Cardiogenic shock: Secondary | ICD-10-CM

## 2019-07-13 MED ORDER — ROSUVASTATIN CALCIUM 40 MG PO TABS: 40.0000 mg | ORAL_TABLET | Freq: Every evening | ORAL | 3 refills | Status: DC

## 2019-07-13 MED ORDER — TICAGRELOR 90 MG PO TABS: 90.0000 mg | ORAL_TABLET | Freq: Two times a day (BID) | ORAL | 3 refills | Status: DC

## 2019-07-13 MED ORDER — METOPROLOL SUCCINATE ER 25 MG PO TB24: 25.0000 mg | ORAL_TABLET | Freq: Every day | ORAL | 3 refills | Status: DC

## 2019-07-13 MED ORDER — ASPIRIN 81 MG PO TBEC: 81.0000 mg | DELAYED_RELEASE_TABLET | Freq: Every day | ORAL | 3 refills | Status: DC

## 2019-07-13 MED ORDER — NITROGLYCERIN 0.4 MG SL SUBL: 0.4000 mg | SUBLINGUAL_TABLET | SUBLINGUAL | 3 refills | Status: DC | PRN

## 2019-07-13 MED FILL — METOPROLOL SUCCINATE ER 25: 25 | 90 days supply | Qty: 90 | Fill #0

## 2019-07-13 MED FILL — ASPIRIN LOW DOSE 81 MG TBEC: 81 | 90 days supply | Qty: 90 | Fill #0

## 2019-07-13 MED FILL — ROSUVASTATIN CALCIUM 40 MG: 40 | 90 days supply | Qty: 90 | Fill #0

## 2019-07-13 MED FILL — NITROGLYCERIN 0.4 MG TAB SL: 0.4 | 8 days supply | Qty: 25 | Fill #0

## 2019-07-13 MED FILL — BRILINTA 90 MG TABLET: 90 | 30 days supply | Qty: 60 | Fill #0

## 2019-07-13 NOTE — Discharge Summary (Addendum)
Discharge Summary    Patient ID: Sarah Pacheco MRN: [id number]; DOB: 1946-02-17  Admit date: 07/11/2019 Discharge date: 07/13/2019  Primary Care Provider: Marin Comment, FNP  Primary Cardiologist: Peter Swaziland, MD  Primary Electrophysiologist:  None   Discharge Diagnoses    Principal Problem:   STEMI involving right coronary artery [facility]) Active Problems:   Hyperlipidemia with target LDL less than 70   CAD in native artery   Cardiogenic shock (HCC)   Bradycardia   Mild anemia   Hypokalemia   Pre-diabetes   AKI (acute kidney injury) [facility])    Diagnostic Studies/Procedures    Cardiac Cath 07/11/19 Conclusion    Prox RCA to Mid RCA lesion is 100% stenosed.  Post intervention, there is a 0% residual stenosis.  A drug-eluting stent was successfully placed using a STENT RESOLUTE ONYX [id number].  A drug-eluting stent was successfully placed using a STENT RESOLUTE ONYX [id number].  The left ventricular systolic function is normal.  LV end diastolic pressure is normal.  The left ventricular ejection fraction is 55-65% by visual estimate.   1. 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 Low LVEDP 3. Normal LV function 4. Successful PCI of the RCA with DES x 2. 5. Resolution of cardiogenic shock and bradycardia.  Plan: DAPT for one year. Monitor in ICU. High dose statin. Will initiate beta blocker as BP allows.     2D Echo 07/12/19 IMPRESSIONS   1. Left ventricular ejection fraction, by visual estimation, is 60 to 65%. The left ventricle has normal function. There is no left ventricular hypertrophy.  2. Left ventricular diastolic parameters are indeterminate.  3. Global right ventricle has normal systolic function.The right ventricular size is normal. No increase in right ventricular wall thickness.  4. Left atrial size was mildly dilated.  5. Right atrial size was normal.  6. The mitral valve is normal in structure. Mild  mitral valve regurgitation.  7. The tricuspid valve is normal in structure. Tricuspid valve regurgitation is not demonstrated.  8. The aortic valve is tricuspid. Aortic valve regurgitation is not visualized. No evidence of aortic valve sclerosis or stenosis.  9. The pulmonic valve was grossly normal. Pulmonic valve regurgitation is trivial. 10. TR signal is inadequate for assessing pulmonary artery systolic pressure. 11. The inferior vena cava is normal in size with greater than 50% respiratory variability, suggesting right atrial pressure of 3 mmHg.    _____________   History of Present Illness     Sarah Pacheco is a 73 y.o. female with HLD, arthritis, anxiety, depression, cataract who presented to [facility] with acute inferior MI.  She lives in [place]. On day of admission, she was shopping at [facility] and developed severe substernal chest pain radiating to both arms. She was nauseated, sweaty and SOB. She called her fiance and told him she thought she was having a heart attack. He called EMS and they we able to locate her and bring her to the ED. Initial EKG by EMS showed slight ST elevation inferiorly but repeat in the ED showed diagnostic ST elevation for an inferior STEMI. She was given ASA, IV heparin, morphine and taken emergently to the cardiac cath lab.   Hospital Course     1. Acute inferior STEMI/CAD - early in in presentation she had associated cardiogenic shock and bradycardia. She also had a Vfib arrest x2 in the cath lab while being prepped for procedure. Emergent cath showed 100% prox-mid RCA for which she had DESx2  placed. LVEF was normal and LVEDP was normal. She was monitored in the ICU and improved rapidly. Dr. SwazilandJordan indicated there was brief atrial fib that resolved. 2D echo 07/12/19 showed EF 60-65%, mildly dilated LA, mild mitral regurgitation. She was started on standard post MI therapy to include ASA, metoprolol, Brilinta, and Crestor. [facility] She  requested 90 day rx. Dr. SwazilandJordan felt that she could continue her estradiol for now but we did advise her to have discussion of risks/benefits with prescribing provider.  2. Hyperlipidemia - statin titrated as above. She has been intolerant of atorvastatin in the past so is now on Crestor. LDL was 81 on first check. If the patient is tolerating statin at time of follow-up appointment, would consider rechecking liver function/lipid panel in 6-8 weeks.  3. Mild anemia - Hgb noted to be in 10-11 range during admission. 9.9 on last check. No bleeding reported. Will need attention on follow-up. She also had this demonstrated on prior lab checks.  4. Hypokalemia - felt likely related to acute event, improved.  5. AKI - presenting Cr 1.25 in setting of acute event, resolved to 0.98 by last check.  6. Hyperglycemia - CBG mildly elevated but A1C 5.8.   The patient feels wonderful today. Dr. SwazilandJordan has seen and examined the patient today and feels she is stable for discharge. Post-discharge instructions outlined below, as per discussion with Dr. SwazilandJordan.  Did the patient have an acute coronary syndrome (MI, NSTEMI, STEMI, etc) this admission?:  Yes                               AHA/ACC Clinical Performance & Quality Measures: 1. Aspirin prescribed? - Yes 2. ADP Receptor Inhibitor (Plavix/Clopidogrel, Brilinta/Ticagrelor or Effient/Prasugrel) prescribed (includes medically managed patients)? - Yes 3. Beta Blocker prescribed? - Yes 4. High Intensity Statin (Lipitor 40-80mg  or Crestor 20-40mg ) prescribed? - Yes 5. EF assessed during THIS hospitalization? - Yes 6. For EF <40%, was ACEI/ARB prescribed? - Not Applicable (EF >/= 40%) 7. For EF <40%, Aldosterone Antagonist (Spironolactone or Eplerenone) prescribed? - Not Applicable (EF >/= 40%) 8. Cardiac Rehab Phase II ordered (Included Medically managed Patients)? - Yes   _____________  Discharge Vitals  Vital Signs. BP (!) 146/71 (BP Location: Left Arm)    Pulse 66   Temp 98 F (36.7 C) (Oral)   Resp 12   Ht 5\' 2"  (1.575 m)   Wt 66.2 kg   SpO2 94%   BMI 26.69 kg/m  Exam per MD report. General: Well developed, well nourished F in no acute distress. Head: Normocephalic, atraumatic, sclera non-icteric, no xanthomas, nares are without discharge. Neck: Negative for carotid bruits. JVP not elevated. Lungs: Clear bilaterally to auscultation without wheezes, rales, or rhonchi. Breathing is unlabored. Heart: RRR S1 S2 without murmurs, rubs, or gallops.  Abdomen: Soft, non-tender, non-distended with normoactive bowel sounds. No rebound/guarding. Extremities: No clubbing or cyanosis. No edema. Distal pedal pulses are 2+ and equal bilaterally. Right groin cath site without hematoma, ecchymosis, or bruit. Neuro: Alert and oriented X 3. Moves all extremities spontaneously. Psych:  Responds to questions appropriately with a normal affect.  Labs & Radiologic Studies    CBC Recent Labs    07/11/19 1708 07/12/19 0244  WBC 18.6* 9.3  9.6  NEUTROABS 15.3*  --   HGB 10.9* 9.9*  9.8*  HCT 34.7* 31.3*  31.2*  MCV 93.8 93.7  93.7  PLT 263 254  606   Basic Metabolic Panel Recent Labs    07/11/19 1708 07/12/19 0244  NA 144 141  K 3.5 4.6  CL 110 111  CO2 22 24  GLUCOSE 221* 117*  BUN 12 9  CREATININE 1.20* 0.98  CALCIUM 7.7* 7.7*   Liver Function Tests Recent Labs    07/11/19 1506 07/11/19 1708  AST 16 44*  ALT 10 30  ALKPHOS 48 54  BILITOT 0.4 0.5  PROT 5.7* 5.8*  ALBUMIN 3.2* 3.3*   High Sensitivity Troponin:   Recent Labs  Lab 07/11/19 1506 07/11/19 1708 07/12/19 0244  TROPONINIHS 15 147* 805*    Hemoglobin A1C Recent Labs    07/11/19 1709  HGBA1C 5.8*   Fasting Lipid Panel Recent Labs    07/12/19 0244  CHOL 134  HDL 56  LDLCALC 64  TRIG 70  CHOLHDL 2.4   _____________  No results found. Disposition   Pt is being discharged home today in good condition.  Follow-up Plans & Appointments     Follow-up Information    Almyra Deforest, [place] Follow up.   Specialties: Cardiology, Radiology Why: [facility] - 07/21/19 at 11:30am. Please arrive 15 minutes prior to appointment to check in. Isaac Laud is one of the physician assistants that works closely with Dr. Martinique and our cardiology team. Contact information: [place] [address] [phone]        Suzan Garibaldi, [place] Follow up.   Specialty: Nurse Practitioner Why: Your labwork showed mild anemia. Please touch base with your primary care provider about plans for further monitoring. Your labwork also showed you may have mild pre-diabetes. Contact information: [place] [address] [phone]          Discharge Instructions    Amb Referral to Cardiac Rehabilitation   Complete by: As directed    Diagnosis:  Coronary Stents STEMI PTCA     After initial evaluation and assessments completed: Virtual Based Care may be provided alone or in conjunction with Phase 2 Cardiac Rehab based on patient barriers.: Yes   Diet - low sodium heart healthy   Complete by: As directed    Discharge instructions   Complete by: As directed    Dr. Martinique felt you can continue your estradiol for now, but we would recommend you have a discussion with the provider who prescribes this medication about risks/benefits continuing forward now that you have the diagnosis of coronary disease.  If you notice any bleeding such as blood in stool, black tarry stools, blood in urine, nosebleeds or any other unusual bleeding, call your doctor immediately. It is not normal to have this kind of bleeding while on a blood thinner and usually indicates there is an underlying problem with one of your body systems that needs to be checked out.   Increase activity slowly   Complete by: As directed    No driving for 2 weeks. No lifting over 10 lbs for 4 weeks. No sexual activity for 4 weeks. Keep procedure site clean & dry. If  you notice increased pain, swelling, bleeding or pus, call/return!  You may shower, but no soaking baths/hot tubs/pools for 1 week.      Discharge Medications   Allergies as of 07/13/2019      Reactions   Atorvastatin Other (See Comments)   Cramps in legs   Codeine Nausea And Vomiting   Erythromycin Base Other (See Comments)   Sick on the stomach  Penicillin G Hives   Unknown Did it involve swelling of the face/tongue/throat, SOB, or low BP? no Did it involve sudden or severe rash/hives, skin peeling, or any reaction on the inside of your mouth or nose? yes Did you need to seek medical attention at a hospital or doctor's office? yes When did it last happen?2010 If all above answers are "NO", may proceed with cephalosporin use.      Medication List    STOP taking these medications   ezetimibe 10 MG tablet Commonly known as: ZETIA   simvastatin 40 MG tablet Commonly known as: ZOCOR     TAKE these medications   acetaminophen 325 MG tablet Commonly known as: TYLENOL Take 650 mg by mouth every 6 (six) hours as needed for mild pain or headache.   aspirin 81 MG EC tablet Take 1 tablet (81 mg total) by mouth daily. Start taking on: July 14, 2019   buPROPion 150 MG 24 hr tablet Commonly known as: WELLBUTRIN XL Take 150 mg by mouth daily.   estradiol 0.5 MG tablet Commonly known as: ESTRACE Take 0.5 mg by mouth at bedtime.   metoprolol succinate 25 MG 24 hr tablet Commonly known as: TOPROL-XL Take 1 tablet (25 mg total) by mouth daily. Start taking on: July 14, 2019   nitroGLYCERIN 0.4 MG SL tablet Commonly known as: NITROSTAT Place 1 tablet (0.4 mg total) under the tongue every 5 (five) minutes as needed for chest pain (up to 3 doses. If taking 3rd dose call 911).   pantoprazole 40 MG tablet Commonly known as: PROTONIX Take 1 tablet (40 mg total) by mouth daily.   rosuvastatin 40 MG tablet Commonly known as: CRESTOR Take 1 tablet (40 mg total) by  mouth every evening.   ticagrelor 90 MG Tabs tablet Commonly known as: BRILINTA Take 1 tablet (90 mg total) by mouth 2 (two) times daily.   vitamin B-12 100 MCG tablet Commonly known as: CYANOCOBALAMIN Take 100 mcg by mouth daily.         Outstanding Labs/Studies   N/a, but see above regarding considerations of follow-up of anemia and lipids.  Duration of Discharge Encounter   Greater than 30 minutes including physician time.  Signed, Laurann Montana, PA-C 07/13/2019, 12:30 PM

## 2019-07-13 NOTE — Care Management (Signed)
07-13-19 CM did speak with patient and she is able to afford the co pay for Brilinta. PA and Staff RN aware. TOC will  deliver the medication to the room. No further needs from CM at this time. Bethena Roys, RN,BSN Case Manager [phone]

## 2019-07-13 NOTE — Discharge Instructions (Signed)
Femoral Site Care °This sheet gives you information about how to care for yourself after your procedure. Your health care provider may also give you more specific instructions. If you have problems or questions, contact your health care provider. °What can I expect after the procedure? °After the procedure, it is common to have: °· Bruising that usually fades within 1-2 weeks. °· Tenderness at the site. °Follow these instructions at home: °Wound care °· Follow instructions from your health care provider about how to take care of your insertion site. Make sure you: °? Wash your hands with soap and water before you change your bandage (dressing). If soap and water are not available, use hand sanitizer. °? Change your dressing as told by your health care provider. °? Leave stitches (sutures), skin glue, or adhesive strips in place. These skin closures may need to stay in place for 2 weeks or longer. If adhesive strip edges start to loosen and curl up, you may trim the loose edges. Do not remove adhesive strips completely unless your health care provider tells you to do that. °· Do not take baths, swim, or use a hot tub until your health care provider approves. °· You may shower 24-48 hours after the procedure or as told by your health care provider. °? Gently wash the site with plain soap and water. °? Pat the area dry with a clean towel. °? Do not rub the site. This may cause bleeding. °· Do not apply powder or lotion to the site. Keep the site clean and dry. °· Check your femoral site every day for signs of infection. Check for: °? Redness, swelling, or pain. °? Fluid or blood. °? Warmth. °? Pus or a bad smell. °Activity °· For the first 2-3 days after your procedure, or as long as directed: °? Avoid climbing stairs as much as possible. °? Do not squat. °· Do not lift anything that is heavier than 10 lb (4.5 kg), or the limit that you are told, until your health care provider says that it is safe. °· Rest as  directed. °? Avoid sitting for a long time without moving. Get up to take short walks every 1-2 hours. °· Do not drive for 24 hours if you were given a medicine to help you relax (sedative). °General instructions °· Take over-the-counter and prescription medicines only as told by your health care provider. °· Keep all follow-up visits as told by your health care provider. This is important. °Contact a health care provider if you have: °· A fever or chills. °· You have redness, swelling, or pain around your insertion site. °Get help right away if: °· The catheter insertion area swells very fast. °· You pass out. °· You suddenly start to sweat or your skin gets clammy. °· The catheter insertion area is bleeding, and the bleeding does not stop when you hold steady pressure on the area. °· The area near or just beyond the catheter insertion site becomes pale, cool, tingly, or numb. °These symptoms may represent a serious problem that is an emergency. Do not wait to see if the symptoms will go away. Get medical help right away. Call your local emergency services (911 in the [place] Do not drive yourself to the hospital. °Summary °· After the procedure, it is common to have bruising that usually fades within 1-2 weeks. °· Check your femoral site every day for signs of infection. °· Do not lift anything that is heavier than 10 lb (4.5 kg), or the   limit that you are told, until your health care provider says that it is safe. °This information is not intended to replace advice given to you by your health care provider. Make sure you discuss any questions you have with your health care provider. °Document Released: 03/30/2014 Document Revised: 08/09/2017 Document Reviewed: 08/09/2017 °Elsevier Patient Education © 2020 [employer] ° °

## 2019-07-13 NOTE — Progress Notes (Signed)
CARDIAC REHAB PHASE I   PRE:  Rate/Rhythm: 68 SR    BP: sitting 139/68    SaO2:   MODE:  Ambulation: 470 ft   POST:  Rate/Rhythm: 91 SR    BP: sitting 154/76     SaO2:   Tolerated well, feels well. Reviewed ed. Eager to d/c. [place], ACSM 07/13/2019 9:40 AM

## 2019-07-13 NOTE — TOC Benefit Eligibility Note (Signed)
Transition of Care [facility]) Benefit Eligibility Note    Patient Details  Name: Sarah Pacheco MRN: [id number] Date of Birth: [birth year]   Medication/Dose: Kary Kos  90 MG BID  Covered?: Yes  Tier: 3 Drug  Prescription Coverage Preferred Pharmacy: [facility] with Person/Company/Phone Number:: ALICIA  @ [employer]  RX # [phone]  Co-Pay: $ 37.00  Prior Approval: No  Deductible: Met       Memory Argue Phone Number: 07/13/2019, 1:07 PM

## 2019-07-21 ENCOUNTER — Ambulatory Visit (INDEPENDENT_AMBULATORY_CARE_PROVIDER_SITE_OTHER): Payer: Medicare Other | Admitting: Physician Assistant

## 2019-07-21 ENCOUNTER — Encounter: Payer: Self-pay | Admitting: Physician Assistant

## 2019-07-21 ENCOUNTER — Other Ambulatory Visit: Payer: Self-pay

## 2019-07-21 VITALS — BP 117/62 | HR 60 | Temp 97.2°F | Ht 62.0 in | Wt 140.4 lb

## 2019-07-21 DIAGNOSIS — Z789 Other specified health status: Secondary | ICD-10-CM

## 2019-07-21 DIAGNOSIS — E782 Mixed hyperlipidemia: Secondary | ICD-10-CM | POA: Diagnosis not present

## 2019-07-21 DIAGNOSIS — I2510 Atherosclerotic heart disease of native coronary artery without angina pectoris: Secondary | ICD-10-CM | POA: Diagnosis not present

## 2019-07-21 NOTE — Patient Instructions (Addendum)
Medication Instructions:   DISCONTINUE CRESTOR   *If you need a refill on your cardiac medications before your next appointment, please call your pharmacy*  Lab Work: NONE ordered at this time of appointment   If you have labs (blood work) drawn today and your tests are completely normal, you will receive your results only by: Marland Kitchen MyChart Message (if you have MyChart) OR . A paper copy in the mail If you have any lab test that is abnormal or we need to change your treatment, we will call you to review the results.  Testing/Procedures: NONE ordered at this time of appointment   Follow-Up: At [facility], you and your health needs are our priority.  As part of our continuing mission to provide you with exceptional heart care, we have created designated Provider Care Teams.  These Care Teams include your primary Cardiologist (physician) and Advanced Practice Providers (APPs -  Physician Assistants and Nurse Practitioners) who all work together to provide you with the care you need, when you need it.   You have been referred to the [facility]-Please schedule for the next 2-3 weeks  Your next appointment:   2 month(s)  The format for your next appointment:   In Person  Provider:   Peter Martinique, MD  Other Instructions  Follow up with your Primary Care Doctor for the sleep and/or emotional issues

## 2019-07-21 NOTE — Progress Notes (Signed)
Cardiology Office Note:    Date:  07/23/2019   ID:  Sarah Pacheco, DOB [birth year], MRN [id number]  PCP:  Marin Comment, FNP  Cardiologist:  Peter Swaziland, MD  Electrophysiologist:  None   Referring MD: Marin Comment, FNP   Chief Complaint  Patient presents with  . Dizziness  . Hospitalization Follow-up    post PCI.     History of Present Illness:    Sarah Pacheco is a 73 y.o. female with a hx of hyperlipidemia, anxiety, depression and recently diagnosed CAD.  Patient presented to [facility] on 07/11/2019 with substernal chest pain while shopping.  Initially EKG showed slight ST elevation inferiorly however repeat EKG in the ED was diagnostic for inferior STEMI.  Emergent cardiac catheterization obtained on 07/11/2019 showed 100% proximal RCA occlusion, this was treated by 2 overlapping resolute Onyx DES.  EF by LV gram was 55 to 65%.  Prior to the cardiac catheterization, she had cardiogenic shock and bradycardia, this improved after the cardiac catheterization.  She also had hypokalemia on arrival as well.  Post cardiac catheterization, high-sensitivity troponin trended up to 805.  Hemoglobin A1c was 5.8.  Echocardiogram obtained on the following day showed EF 60 to 65%, mild MR.  After the cardiac catheterization, he was placed on aspirin, metoprolol, Brilinta and Crestor.  Patient presents for cardiology office today.  She denies any further chest discomfort.  She has been compliant with her medications.  Her heart rate is stable on the current dose of metoprolol.  EKG showed no significant ischemic changes.  She is unable to walk around due to significant muscle ache on the Crestor.  She has prior intolerance of Lipitor as well.  I plan to refer her to Dr. Rennis Golden lipid clinic to consider PCSK9 inhibitor or Bempdoic acid.  Overall she is doing quite well from cardiology perspective that they can follow-up with Dr. Swaziland in 2 to 3 months.  Past Medical History:  Diagnosis Date    . Anxiety   . Arthritis   . Cataract   . Chest pain   . Depression   . GERD (gastroesophageal reflux disease)   . Hyperlipidemia   . Hyperlipidemia with target LDL less than 70 10/25/2015    Past Surgical History:  Procedure Laterality Date  . APPENDECTOMY    . CATARACT EXTRACTION, BILATERAL    . COLONOSCOPY  2017   X2     colon polyps  . CORONARY/GRAFT ACUTE MI REVASCULARIZATION N/A 07/11/2019   Procedure: CORONARY/GRAFT ACUTE MI REVASCULARIZATION;  Surgeon: Swaziland, Peter M, MD;  Location: [facility] INVASIVE CV LAB;  Service: Cardiovascular;  Laterality: N/A;  . LAPAROTOMY N/A 10/10/2018   Procedure: EXPLORATORY LAPAROTOMY RIGHT COLECTOMY;  Surgeon: Emelia Loron, MD;  Location: [facility] ORS;  Service: General;  Laterality: N/A;  . LEFT HEART CATH AND CORONARY ANGIOGRAPHY N/A 07/11/2019   Procedure: LEFT HEART CATH AND CORONARY ANGIOGRAPHY;  Surgeon: Swaziland, Peter M, MD;  Location: [facility] INVASIVE CV LAB;  Service: Cardiovascular;  Laterality: N/A;  . TEMPORARY PACEMAKER N/A 07/11/2019   Procedure: TEMPORARY PACEMAKER;  Surgeon: Swaziland, Peter M, MD;  Location: [facility] INVASIVE CV LAB;  Service: Cardiovascular;  Laterality: N/A;  . VAGINAL HYSTERECTOMY  1973   AUB, fibroids    Current Medications: Current Meds  Medication Sig  . acetaminophen (TYLENOL) 325 MG tablet Take 650 mg by mouth every 6 (six) hours as needed for mild pain or headache.  Marland Kitchen aspirin EC 81 MG EC tablet Take 1 tablet (81  mg total) by mouth daily.  Marland Kitchen. buPROPion (WELLBUTRIN XL) 150 MG 24 hr tablet Take 150 mg by mouth daily.  Marland Kitchen. estradiol (ESTRACE) 0.5 MG tablet Take 0.5 mg by mouth at bedtime.   . metoprolol succinate (TOPROL-XL) 25 MG 24 hr tablet Take 1 tablet (25 mg total) by mouth daily.  . nitroGLYCERIN (NITROSTAT) 0.4 MG SL tablet Place 1 tablet (0.4 mg total) under the tongue every 5 (five) minutes as needed for chest pain (up to 3 doses. If taking 3rd dose call 911).  . pantoprazole (PROTONIX) 40 MG tablet Take 1 tablet (40 mg  total) by mouth daily.  . ticagrelor (BRILINTA) 90 MG TABS tablet Take 1 tablet (90 mg total) by mouth 2 (two) times daily.  . vitamin B-12 (CYANOCOBALAMIN) 100 MCG tablet Take 100 mcg by mouth daily.  . [DISCONTINUED] rosuvastatin (CRESTOR) 40 MG tablet Take 1 tablet (40 mg total) by mouth every evening.     Allergies:   Atorvastatin, Codeine, Crestor [rosuvastatin], Erythromycin base, and Penicillin g   Social History   Socioeconomic History  . Marital status: Divorced    Spouse name: Not on file  . Number of children: Not on file  . Years of education: Not on file  . Highest education level: Not on file  Occupational History  . Not on file  Tobacco Use  . Smoking status: Former Smoker    Packs/day: 0.50    Years: 15.00    Pack years: 7.50    Types: Cigarettes  . Smokeless tobacco: Never Used  . Tobacco comment: Quit around age 73  Substance and Sexual Activity  . Alcohol use: No  . Drug use: No  . Sexual activity: Not on file  Other Topics Concern  . Not on file  Social History Narrative  . Not on file   Social Determinants of Health   Financial Resource Strain:   . Difficulty of Paying Living Expenses: Not on file  Food Insecurity:   . Worried About Programme researcher, broadcasting/film/videounning Out of Food in the Last Year: Not on file  . Ran Out of Food in the Last Year: Not on file  Transportation Needs:   . Lack of Transportation (Medical): Not on file  . Lack of Transportation (Non-Medical): Not on file  Physical Activity:   . Days of Exercise per Week: Not on file  . Minutes of Exercise per Session: Not on file  Stress:   . Feeling of Stress : Not on file  Social Connections:   . Frequency of Communication with Friends and Family: Not on file  . Frequency of Social Gatherings with Friends and Family: Not on file  . Attends Religious Services: Not on file  . Active Member of Clubs or Organizations: Not on file  . Attends BankerClub or Organization Meetings: Not on file  . Marital Status: Not on  file     Family History: The patient's family history includes COPD in her brother; Colon cancer (age of onset: 2965) in her brother and sister; Colon polyps in her brother and sister; Heart attack in her sister; Heart attack (age of onset: 8559) in her brother; Heart attack (age of onset: 3072) in her father; Heart disease in her brother, father, sister, and sister; Heart failure in her brother; Lung cancer in her brother. There is no history of Esophageal cancer, Rectal cancer, or Stomach cancer.  ROS:   Please see the history of present illness.     All other systems reviewed and are negative.  EKGs/Labs/Other Studies Reviewed:    The following studies were reviewed today:  Cath 07/11/2019  Prox RCA to Mid RCA lesion is 100% stenosed.  Post intervention, there is a 0% residual stenosis.  A drug-eluting stent was successfully placed using a STENT RESOLUTE ONYX [id number].  A drug-eluting stent was successfully placed using a STENT RESOLUTE ONYX [id number].  The left ventricular systolic function is normal.  LV end diastolic pressure is normal.  The left ventricular ejection fraction is 55-65% by visual estimate.   1. 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 Low LVEDP 3. Normal LV function 4. Successful PCI of the RCA with DES x 2. 5. Resolution of cardiogenic shock and bradycardia.  Plan: DAPT for one year. Monitor in ICU. High dose statin. Will initiate beta blocker as BP allows.   EKG:  EKG is ordered today.  The ekg ordered today demonstrates normal sinus rhythm with low voltage.  Nonspecific T wave changes  Recent Labs: 10/14/2018: Magnesium 2.3 07/11/2019: ALT 30 07/12/2019: BUN 9; Creatinine, Ser 0.98; Hemoglobin 9.8; Hemoglobin 9.9; Platelets 248; Platelets 254; Potassium 4.6; Sodium 141  Recent Lipid Panel    Component Value Date/Time   CHOL 134 07/12/2019 0244   TRIG 70 07/12/2019 0244   HDL 56 07/12/2019 0244   CHOLHDL 2.4  07/12/2019 0244   VLDL 14 07/12/2019 0244   LDLCALC 64 07/12/2019 0244    Physical Exam:    VS:  BP 117/62   Pulse 60   Temp (!) 97.2 F (36.2 C)   Ht  (1.575 m)   Wt 140 lb 6.4 oz (63.7 kg)   BMI 25.68 kg/m     Wt Readings from Last 3 Encounters:  07/21/19 140 lb 6.4 oz (63.7 kg)  07/13/19 145 lb 14.4 oz (66.2 kg)  10/21/18 147 lb 15.9 oz (67.1 kg)     GEN:  Well nourished, well developed in no acute distress HEENT: Normal NECK: No JVD; No carotid bruits LYMPHATICS: No lymphadenopathy CARDIAC: RRR, no murmurs, rubs, gallops RESPIRATORY:  Clear to auscultation without rales, wheezing or rhonchi  ABDOMEN: Soft, non-tender, non-distended MUSCULOSKELETAL:  No edema; No deformity  SKIN: Warm and dry NEUROLOGIC:  Alert and oriented x 3 PSYCHIATRIC:  Normal affect   ASSESSMENT:    1. Coronary artery disease involving native coronary artery of native heart without angina pectoris   2. Mixed hyperlipidemia   3. Statin intolerance    PLAN:    In order of problems listed above:  1. CAD: Recent inferior STEMI.  Underwent DES x2 to RCA.  Normal EF.  Continue aspirin and Brilinta.  Post MI, patient has been having some mild depressive disorder, I recommended she discuss this with her PCP  2. Hyperlipidemia: Intolerant of both Lipitor and Crestor.  We will discontinue Crestor for the time being, and referred the patient to our lipid clinic to consider PCSK9 inhibitor or bempdoic acid   Medication Adjustments/Labs and Tests Ordered: Current medicines are reviewed at length with the patient today.  Concerns regarding medicines are outlined above.  Orders Placed This Encounter  Procedures  . AMB Referral to Advanced Lipid Disorders Clinic  . EKG 12-Lead   No orders of the defined types were placed in this encounter.   Patient Instructions  Medication Instructions:   DISCONTINUE CRESTOR   *If you need a refill on your cardiac medications before your next  appointment, please call your pharmacy*  Lab Work: NONE ordered at this  time of appointment   If you have labs (blood work) drawn today and your tests are completely normal, you will receive your results only by: Marland Kitchen MyChart Message (if you have MyChart) OR . A paper copy in the mail If you have any lab test that is abnormal or we need to change your treatment, we will call you to review the results.  Testing/Procedures: NONE ordered at this time of appointment   Follow-Up: At [facility], you and your health needs are our priority.  As part of our continuing mission to provide you with exceptional heart care, we have created designated Provider Care Teams.  These Care Teams include your primary Cardiologist (physician) and Advanced Practice Providers (APPs -  Physician Assistants and Nurse Practitioners) who all work together to provide you with the care you need, when you need it.   You have been referred to the Advanced Lipid Disorders Clinic-Please schedule for the next 2-3 weeks  Your next appointment:   2 month(s)  The format for your next appointment:   In Person  Provider:   Peter Martinique, MD  Other Instructions  Follow up with your Primary Care Doctor for the sleep and/or emotional issues      Signed, Almyra Deforest, [place]  07/23/2019 11:54 PM    [place]

## 2019-07-23 ENCOUNTER — Encounter: Payer: Self-pay | Admitting: Physician Assistant

## 2019-08-14 ENCOUNTER — Telehealth (INDEPENDENT_AMBULATORY_CARE_PROVIDER_SITE_OTHER): Payer: Medicare Other | Admitting: Internal Medicine

## 2019-08-14 ENCOUNTER — Encounter: Payer: Self-pay | Admitting: Internal Medicine

## 2019-08-14 VITALS — BP 98/61 | HR 61 | Ht 63.0 in | Wt 138.0 lb

## 2019-08-14 DIAGNOSIS — I2510 Atherosclerotic heart disease of native coronary artery without angina pectoris: Secondary | ICD-10-CM

## 2019-08-14 DIAGNOSIS — I2111 ST elevation (STEMI) myocardial infarction involving right coronary artery: Secondary | ICD-10-CM

## 2019-08-14 DIAGNOSIS — E785 Hyperlipidemia, unspecified: Secondary | ICD-10-CM

## 2019-08-14 DIAGNOSIS — Z789 Other specified health status: Secondary | ICD-10-CM | POA: Diagnosis not present

## 2019-08-14 DIAGNOSIS — E782 Mixed hyperlipidemia: Secondary | ICD-10-CM

## 2019-08-14 NOTE — Progress Notes (Signed)
Virtual Visit via Telephone Note   This visit type was conducted due to national recommendations for restrictions regarding the COVID-19 Pandemic (e.g. social distancing) in an effort to limit this patient's exposure and mitigate transmission in our community.  Due to her co-morbid illnesses, this patient is at least at moderate risk for complications without adequate follow up.  This format is felt to be most appropriate for this patient at this time.  The patient did not have access to video technology/had technical difficulties with video requiring transitioning to audio format only (telephone).  All issues noted in this document were discussed and addressed.  No physical exam could be performed with this format.  Please refer to the patient's chart for her  consent to telehealth for [facility].   Evaluation Performed:  Telephone visit  Date:  08/14/2019   ID:  Sarah Pacheco, DOB 01/11/1946, MRN [id number]  Patient Location:  [address] [place] [address]  Provider location:   [address], [address] [place], [place] [address]  PCP:  Marin Comment, FNP  Cardiologist:  Peter Swaziland, MD Electrophysiologist:  None   Chief Complaint:  Statin intolerance  History of Present Illness:    Sarah Pacheco is a 74 y.o. female who presents via audio/video conferencing for a telehealth visit today.  This is a 74 year old female with a history of dyslipidemia (LDL greater than 200), anxiety, depression and coronary disease.  She presented in early December with substernal chest pain and was found to have inferior ST elevation MI complicated by cardiogenic shock.  She required 2 stents to the proximal RCA however LVEF was normal.  Prior to this her LDL cholesterol had been in the 120s on ezetimibe and simvastatin, however just prior to the procedure LDL was at the low 80s and then 64 the day after, likely owing to the effects of acute MI.  Based on this her Zetia and simvastatin were  discontinued and she was switched to rosuvastatin 2 mg daily.  After taking this for several weeks she had significant myalgias and discontinued the medication.  She was seen in follow-up by Azalee Course on December 11 who referred her to the lipid clinic for further evaluation.  The patient does not have symptoms concerning for COVID-19 infection (fever, chills, cough, or new SHORTNESS OF BREATH).    Prior CV studies:   The following studies were reviewed today:  Chart reviewed  PMHx:  Past Medical History:  Diagnosis Date  . Anxiety   . Arthritis   . Cataract   . Chest pain   . Depression   . GERD (gastroesophageal reflux disease)   . Hyperlipidemia   . Hyperlipidemia with target LDL less than 70 10/25/2015    Past Surgical History:  Procedure Laterality Date  . APPENDECTOMY    . CATARACT EXTRACTION, BILATERAL    . COLONOSCOPY  2017   X2     colon polyps  . CORONARY/GRAFT ACUTE MI REVASCULARIZATION N/A 07/11/2019   Procedure: CORONARY/GRAFT ACUTE MI REVASCULARIZATION;  Surgeon: Swaziland, Peter M, MD;  Location: [facility] INVASIVE CV LAB;  Service: Cardiovascular;  Laterality: N/A;  . LAPAROTOMY N/A 10/10/2018   Procedure: EXPLORATORY LAPAROTOMY RIGHT COLECTOMY;  Surgeon: Emelia Loron, MD;  Location: [facility] ORS;  Service: General;  Laterality: N/A;  . LEFT HEART CATH AND CORONARY ANGIOGRAPHY N/A 07/11/2019   Procedure: LEFT HEART CATH AND CORONARY ANGIOGRAPHY;  Surgeon: Swaziland, Peter M, MD;  Location: [facility] INVASIVE CV LAB;  Service: Cardiovascular;  Laterality:  N/A;  . TEMPORARY PACEMAKER N/A 07/11/2019   Procedure: TEMPORARY PACEMAKER;  Surgeon: Martinique, Peter M, MD;  Location: [place] CV LAB;  Service: Cardiovascular;  Laterality: N/A;  . VAGINAL HYSTERECTOMY  1973   AUB, fibroids    FAMHx:  Family History  Problem Relation Age of Onset  . Heart attack Father 72       MI  . Heart disease Father   . Heart disease Sister   . Heart attack Brother 62       MI  . Heart attack Sister     . Heart disease Sister   . Colon cancer Sister 57  . Colon polyps Sister   . Heart disease Brother   . Heart failure Brother   . Colon cancer Brother 53  . Colon polyps Brother   . COPD Brother   . Lung cancer Brother   . Esophageal cancer Neg Hx   . Rectal cancer Neg Hx   . Stomach cancer Neg Hx     SOCHx:   reports that she has quit smoking. Her smoking use included cigarettes. She has a 7.50 pack-year smoking history. She has never used smokeless tobacco. She reports that she does not drink alcohol or use drugs.  ALLERGIES:  Allergies  Allergen Reactions  . Atorvastatin Other (See Comments)    Cramps in legs  . Codeine Nausea And Vomiting  . Crestor [Rosuvastatin]     myalgia  . Erythromycin Base Other (See Comments)    Sick on the stomach  . Penicillin G Hives    Unknown Did it involve swelling of the face/tongue/throat, SOB, or low BP? no Did it involve sudden or severe rash/hives, skin peeling, or any reaction on the inside of your mouth or nose? yes Did you need to seek medical attention at a hospital or doctor's office? yes When did it last happen?2010 If all above answers are "NO", may proceed with cephalosporin use.     MEDS:  Current Meds  Medication Sig  . acetaminophen (TYLENOL) 325 MG tablet Take 650 mg by mouth every 6 (six) hours as needed for mild pain or headache.  Marland Kitchen aspirin EC 81 MG EC tablet Take 1 tablet (81 mg total) by mouth daily.  Marland Kitchen buPROPion (WELLBUTRIN XL) 150 MG 24 hr tablet Take 150 mg by mouth daily.  Marland Kitchen estradiol (ESTRACE) 0.5 MG tablet Take 0.5 mg by mouth at bedtime.   Marland Kitchen ezetimibe (ZETIA) 10 MG tablet Take 10 mg by mouth daily.  . nitroGLYCERIN (NITROSTAT) 0.4 MG SL tablet Place 1 tablet (0.4 mg total) under the tongue every 5 (five) minutes as needed for chest pain (up to 3 doses. If taking 3rd dose call 911).  . pantoprazole (PROTONIX) 40 MG tablet Take 1 tablet (40 mg total) by mouth daily.  . simvastatin (ZOCOR) 40 MG tablet  Take 40 mg by mouth daily.  . ticagrelor (BRILINTA) 90 MG TABS tablet Take 1 tablet (90 mg total) by mouth 2 (two) times daily.  . vitamin B-12 (CYANOCOBALAMIN) 100 MCG tablet Take 100 mcg by mouth daily.  . [DISCONTINUED] metoprolol succinate (TOPROL-XL) 25 MG 24 hr tablet Take 1 tablet (25 mg total) by mouth daily.     ROS: Pertinent items noted in HPI and remainder of comprehensive ROS otherwise negative.  Labs/Other Tests and Data Reviewed:    Recent Labs: 10/14/2018: Magnesium 2.3 07/11/2019: ALT 30 07/12/2019: BUN 9; Creatinine, Ser 0.98; Hemoglobin 9.8; Hemoglobin 9.9; Platelets 248; Platelets 254; Potassium 4.6; Sodium  141   Recent Lipid Panel Lab Results  Component Value Date/Time   CHOL 134 07/12/2019 02:44 AM   TRIG 70 07/12/2019 02:44 AM   HDL 56 07/12/2019 02:44 AM   CHOLHDL 2.4 07/12/2019 02:44 AM   LDLCALC 64 07/12/2019 02:44 AM    Wt Readings from Last 3 Encounters:  08/14/19 138 lb (62.6 kg)  07/21/19 140 lb 6.4 oz (63.7 kg)  07/13/19 145 lb 14.4 oz (66.2 kg)     Exam:    Vital Signs:  BP 98/61   Pulse 61   Ht 5\' 3"  (1.6 m)   Wt 138 lb (62.6 kg)   BMI 24.45 kg/m    Exam not performed due to telephone visit  ASSESSMENT & PLAN:    1. CAD with recent inferior STEMI, status post DES x2 to the RCA 2. Recent cardiogenic shock 3. Dyslipidemia with intolerance to high potency Crestor 4. Goal LDL less than 70  Ms.Lindo was tolerating the combination of simvastatin and ezetimibe.  We will plan to restart this at 40/10 mg and repeat lipids in about 3 months.  If she remains above target, then consider adding PCSK9 inhibitor therapy.  In the past she had LDLs greater than 200 and may have a component of familial hyperlipidemia.  LDL was at goal at the time of STEMI however this is likely falsely low.  More importantly PCSK9 data was best in the setting of maximally tolerated statin therapy therefore as she previously tolerated statin and ezetimibe, it is important  for to reestablish that before considering additional therapies.  COVID-19 Education: The signs and symptoms of COVID-19 were discussed with the patient and how to seek care for testing (follow up with PCP or arrange E-visit).  The importance of social distancing was discussed today.  Patient Risk:   After full review of this patients clinical status, I feel that they are at least moderate risk at this time.  Time:   Today, I have spent 25 minutes with the patient with telehealth technology discussing dyslipidemia, statin intolerance, recent STEMI.     Medication Adjustments/Labs and Tests Ordered: Current medicines are reviewed at length with the patient today.  Concerns regarding medicines are outlined above.   Tests Ordered: Orders Placed This Encounter  Procedures  . Lipid panel    Medication Changes: No orders of the defined types were placed in this encounter.   Disposition:  in 3 month(s)  [place], MD, [facility], FACP  [place]  [facility]  Medical Director of the Advanced Lipid Disorders &  Cardiovascular Risk Reduction Clinic Diplomate of the American Board of Clinical Lipidology Attending Cardiologist  Direct Dial: [phone]  Fax: [phone]  Website:  [email or web address]  [phone], MD  08/14/2019 8:19 AM

## 2019-08-14 NOTE — Patient Instructions (Signed)
Medication Instructions:  START simvastatin 40mg  daily and zetia 10mg  daily Continue other current medications  *If you need a refill on your cardiac medications before your next appointment, please call your pharmacy*  Lab Work: FASTING lab work in 3 months to check cholesterol   If you have labs (blood work) drawn today and your tests are completely normal, you will receive your results only by: MyChart Message (if you have MyChart) OR . A paper copy in the mail If you have any lab test that is abnormal or we need to change your treatment, we will call you to review the results.  Testing/Procedures: NONE  Follow-Up: At [facility], you and your health needs are our priority.  As part of our continuing mission to provide you with exceptional heart care, we have created designated Provider Care Teams.  These Care Teams include your primary Cardiologist (physician) and Advanced Practice Providers (APPs -  Physician Assistants and Nurse Practitioners) who all work together to provide you with the care you need, when you need it.  Your next appointment:   3 month(s) - lipid clinic  The format for your next appointment:   Either In Person or Virtual  Provider:   K. Marland Kitchen Hilty, MD  Other Instructions

## 2019-09-06 ENCOUNTER — Telehealth (HOSPITAL_COMMUNITY): Payer: Self-pay | Admitting: *Deleted

## 2019-09-06 NOTE — Telephone Encounter (Signed)
Called and left message for pt in regards to referral for cardiac rehab.  Requested call back.   Contact information provided. Alanson Aly, BSN Cardiac and Emergency planning/management officer

## 2019-09-11 ENCOUNTER — Telehealth (HOSPITAL_COMMUNITY): Payer: Self-pay | Admitting: *Deleted

## 2019-09-11 NOTE — Telephone Encounter (Signed)
No response from message left on 09/06/19  regarding scheduling for cardiac rehab.  Please contact letter sent to address on file.  Will close this referral if no response in 2 weeks. Loranzo Desha RN, BSN Cardiac and Pulmonary Rehab Nurse Navigator   

## 2019-09-19 ENCOUNTER — Ambulatory Visit: Payer: Medicare Other

## 2019-09-21 ENCOUNTER — Ambulatory Visit: Payer: Medicare Other

## 2019-09-26 ENCOUNTER — Ambulatory Visit: Payer: Medicare Other | Admitting: Cardiology

## 2019-10-13 NOTE — Progress Notes (Signed)
Cardiology Office Note:    Date:  10/16/2019   ID:  Sarah Pacheco, DOB 09-Dec-1945, MRN [id number]  PCP:  Marin Comment, FNP  Cardiologist:  Callista Hoh [place], MD  Electrophysiologist:  None   Referring MD: Marin Comment, FNP   Chief Complaint  Patient presents with  . Coronary Artery Disease    History of Present Illness:    Sarah Pacheco is a 74 y.o. female with a hx of hyperlipidemia, anxiety, depression and  CAD.  Patient presented to [facility] on 07/11/2019 with substernal chest pain while shopping.  Initially EKG showed slight ST elevation inferiorly however repeat EKG in the ED was diagnostic for inferior STEMI.  Emergent cardiac catheterization obtained on 07/11/2019 showed 100% proximal RCA occlusion, this was treated by 2 overlapping resolute Onyx DES.  EF by LV gram was 55 to 65%.  Prior to the cardiac catheterization, she had cardiogenic shock and bradycardia, this improved after the cardiac catheterization.   Post cardiac catheterization, high-sensitivity troponin trended up to 805.  Hemoglobin A1c was 5.8.  Echocardiogram obtained on the following day showed EF 60 to 65%, mild MR.  After the cardiac catheterization, she was placed on aspirin, metoprolol, Brilinta and Crestor. She was intolerant of Crestor as she had been with lipitor before. She was referred to the lipid clinic. Dr Rennis Golden resumed Vytorin 10/40 mg daily which she had tolerated before. If still not at goal consider PCSK 9 inhibitor.   On follow up she does note she bruises easily. She denies any chest pain or SOB. Notes her heart races when she first wakens in am but lasts less than one minute. Does some walking and has a treadmill but will be more active when the weather warms.    Past Medical History:  Diagnosis Date  . Anxiety   . Arthritis   . Cataract   . Chest pain   . Depression   . GERD (gastroesophageal reflux disease)   . Hyperlipidemia   . Hyperlipidemia with target LDL less than 70 10/25/2015     Past Surgical History:  Procedure Laterality Date  . APPENDECTOMY    . CATARACT EXTRACTION, BILATERAL    . COLONOSCOPY  2017   X2     colon polyps  . CORONARY/GRAFT ACUTE MI REVASCULARIZATION N/A 07/11/2019   Procedure: CORONARY/GRAFT ACUTE MI REVASCULARIZATION;  Surgeon: [place], Chinmay Squier M, MD;  Location: [facility] INVASIVE CV LAB;  Service: Cardiovascular;  Laterality: N/A;  . LAPAROTOMY N/A 10/10/2018   Procedure: EXPLORATORY LAPAROTOMY RIGHT COLECTOMY;  Surgeon: Emelia Loron, MD;  Location: [facility] ORS;  Service: General;  Laterality: N/A;  . LEFT HEART CATH AND CORONARY ANGIOGRAPHY N/A 07/11/2019   Procedure: LEFT HEART CATH AND CORONARY ANGIOGRAPHY;  Surgeon: [place]il[place], Gladyce Mcray M, MD;  Location: [facility] INVASIVE CV LAB;  Service: Cardiovascular;  Laterality: N/A;  . TEMPORARY PACEMAKER N/A 07/11/2019   Procedure: TEMPORARY PACEMAKER;  Surgeon: Sw[place]il[place], Raden Byington M, MD;  Location: [facility] INVASIVE CV LAB;  Service: Cardiovascular;  Laterality: N/A;  . VAGINAL HYSTERECTOMY  1973   AUB, fibroids    Current Medications: Current Meds  Medication Sig  . acetaminophen (TYLENOL) 325 MG tablet Take 650 mg by mouth every 6 (six) hours as needed for mild pain or headache.  Marland Kitchen aspirin EC 81 MG EC tablet Take 1 tablet (81 mg total) by mouth daily.  Marland Kitchen buPROPion (WELLBUTRIN XL) 150 MG 24 hr tablet Take 150 mg by mouth daily.  Marland Kitchen ezetimibe (ZETIA) 10 MG tablet Take 10 mg  by mouth daily.  . metoprolol tartrate (LOPRESSOR) 25 MG tablet Take 25 mg by mouth daily.  . nitroGLYCERIN (NITROSTAT) 0.4 MG SL tablet Place 1 tablet (0.4 mg total) under the tongue every 5 (five) minutes as needed for chest pain (up to 3 doses. If taking 3rd dose call 911).  . pantoprazole (PROTONIX) 40 MG tablet Take 1 tablet (40 mg total) by mouth daily.  . simvastatin (ZOCOR) 40 MG tablet Take 40 mg by mouth daily.  . ticagrelor (BRILINTA) 90 MG TABS tablet Take 1 tablet (90 mg total) by mouth 2 (two) times daily.  . traZODone (DESYREL) 100 MG tablet  Take 100 mg by mouth at bedtime.  . vitamin B-12 (CYANOCOBALAMIN) 100 MCG tablet Take 100 mcg by mouth daily.  . [DISCONTINUED] estradiol (ESTRACE) 0.5 MG tablet Take 0.5 mg by mouth at bedtime.      Allergies:   Atorvastatin, Codeine, Crestor [rosuvastatin], Erythromycin base, and Penicillin g   Social History   Socioeconomic History  . Marital status: Divorced    Spouse name: Not on file  . Number of children: Not on file  . Years of education: Not on file  . Highest education level: Not on file  Occupational History  . Not on file  Tobacco Use  . Smoking status: Former Smoker    Packs/day: 0.50    Years: 15.00    Pack years: 7.50    Types: Cigarettes  . Smokeless tobacco: Never Used  . Tobacco comment: Quit around age 21  Substance and Sexual Activity  . Alcohol use: No  . Drug use: No  . Sexual activity: Not on file  Other Topics Concern  . Not on file  Social History Narrative  . Not on file   Social Determinants of Health   Financial Resource Strain:   . Difficulty of Paying Living Expenses: Not on file  Food Insecurity:   . Worried About Programme researcher, broadcasting/film/video in the Last Year: Not on file  . Ran Out of Food in the Last Year: Not on file  Transportation Needs:   . Lack of Transportation (Medical): Not on file  . Lack of Transportation (Non-Medical): Not on file  Physical Activity:   . Days of Exercise per Week: Not on file  . Minutes of Exercise per Session: Not on file  Stress:   . Feeling of Stress : Not on file  Social Connections:   . Frequency of Communication with Friends and Family: Not on file  . Frequency of Social Gatherings with Friends and Family: Not on file  . Attends Religious Services: Not on file  . Active Member of Clubs or Organizations: Not on file  . Attends Banker Meetings: Not on file  . Marital Status: Not on file     Family History: The patient's family history includes COPD in her brother; Colon cancer (age of  onset: 69) in her brother and sister; Colon polyps in her brother and sister; Heart attack in her sister; Heart attack (age of onset: 71) in her brother; Heart attack (age of onset: 25) in her father; Heart disease in her brother, father, sister, and sister; Heart failure in her brother; Lung cancer in her brother. There is no history of Esophageal cancer, Rectal cancer, or Stomach cancer.  ROS:   Please see the history of present illness.     All other systems reviewed and are negative.  EKGs/Labs/Other Studies Reviewed:    The following studies were reviewed today:  Cath 07/11/2019  Prox RCA to Mid RCA lesion is 100% stenosed.  Post intervention, there is a 0% residual stenosis.  A drug-eluting stent was successfully placed using a [place] [id number].  A drug-eluting stent was successfully placed using a STENT RESOLUTE ONYX [id number].  The left ventricular systolic function is normal.  LV end diastolic pressure is normal.  The left ventricular ejection fraction is 55-65% by visual estimate.   1. 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 Low LVEDP 3. Normal LV function 4. Successful PCI of the RCA with DES x 2. 5. Resolution of cardiogenic shock and bradycardia.  Plan: DAPT for one year. Monitor in ICU. High dose statin. Will initiate beta blocker as BP allows.   EKG:  EKG is not ordered today.    Recent Labs: 07/11/2019: ALT 30 07/12/2019: BUN 9; Creatinine, Ser 0.98; Hemoglobin 9.8; Hemoglobin 9.9; Platelets 248; Platelets 254; Potassium 4.6; Sodium 141  Recent Lipid Panel    Component Value Date/Time   CHOL 134 07/12/2019 0244   TRIG 70 07/12/2019 0244   HDL 56 07/12/2019 0244   CHOLHDL 2.4 07/12/2019 0244   VLDL 14 07/12/2019 0244   LDLCALC 64 07/12/2019 0244    Physical Exam:    VS:  BP 134/73   Pulse 65   Temp (!) 97.3 F (36.3 C)   Resp (!) 21   Ht 5\' 3"  (1.6 m)   SpO2 99%   BMI 24.45 kg/m     Wt  Readings from Last 3 Encounters:  08/14/19 138 lb (62.6 kg)  07/21/19 140 lb 6.4 oz (63.7 kg)  07/13/19 145 lb 14.4 oz (66.2 kg)     GEN:  Well nourished, well developed in no acute distress HEENT: Normal NECK: No JVD; No carotid bruits LYMPHATICS: No lymphadenopathy CARDIAC: RRR, no murmurs, rubs, gallops RESPIRATORY:  Clear to auscultation without rales, wheezing or rhonchi  ABDOMEN: Soft, non-tender, non-distended MUSCULOSKELETAL:  No edema; No deformity  SKIN: Warm and dry, some bruises NEUROLOGIC:  Alert and oriented x 3 PSYCHIATRIC:  Normal affect   ASSESSMENT:    1. Coronary artery disease involving native coronary artery of native heart without angina pectoris   2. Hyperlipidemia with target LDL less than 70    PLAN:    In order of problems listed above:  1. CAD: s/p inferior STEMI in December 2020.  Underwent DES x2 to RCA.  Normal EF.  Continue aspirin and Brilinta for one year. Encourage increased activity.  2. Hyperlipidemia: Intolerant of both Lipitor and Crestor.  Now on Vytorin. Consider  PCSK9 inhibitor if still not at goal. Follow up with Dr Debara Pickett in April  3.   Palpitations. Mild. She does have an Apple watch so I asked that she check her HR with these episodes and let me know.    Medication Adjustments/Labs and Tests Ordered: Current medicines are reviewed at length with the patient today.  Concerns regarding medicines are outlined above.  No orders of the defined types were placed in this encounter.  No orders of the defined types were placed in this encounter.   There are no Patient Instructions on file for this visit.   Signed, Othelia Riederer [place], MD  10/16/2019 9:51 AM    [facility]

## 2019-10-16 ENCOUNTER — Other Ambulatory Visit: Payer: Self-pay

## 2019-10-16 ENCOUNTER — Ambulatory Visit: Payer: Medicare Other | Admitting: Cardiology

## 2019-10-16 ENCOUNTER — Encounter: Payer: Self-pay | Admitting: Cardiology

## 2019-10-16 VITALS — BP 134/73 | HR 65 | Temp 97.3°F | Resp 21 | Ht 63.0 in

## 2019-10-16 DIAGNOSIS — R002 Palpitations: Secondary | ICD-10-CM | POA: Diagnosis not present

## 2019-10-16 DIAGNOSIS — I2510 Atherosclerotic heart disease of native coronary artery without angina pectoris: Secondary | ICD-10-CM | POA: Diagnosis not present

## 2019-10-16 DIAGNOSIS — E785 Hyperlipidemia, unspecified: Secondary | ICD-10-CM | POA: Diagnosis not present

## 2019-10-22 IMAGING — CT CT ABD-PELV W/ CM
2 of 5 series · 15 of 46 positions shown, 17 images · IV contrast (ISOVUE)
Comparison: Abdominal ultrasound 07/21/2018 appears

CLINICAL DATA: 73-year-old with acute abdominal pain.

EXAM:
CT ABDOMEN AND PELVIS WITH CONTRAST
TECHNIQUE: Multidetector CT imaging of the abdomen and pelvis was performed
using the standard protocol following bolus administration of
intravenous contrast.
CONTRAST:  100mL Y72K04-7YY IOPAMIDOL ([id number]) INJECTION 61%

[Series 2: axial st · axial · 0.97mm/px · z∈[+977,+1397]mm · 12 of 98 slices shown, 14 images]
[im 7/98  soft-tissue]
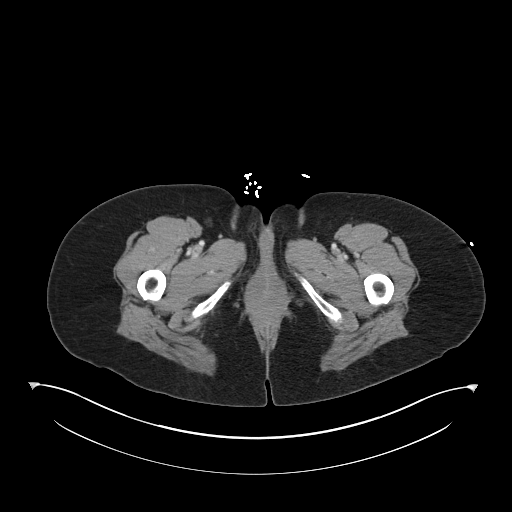
[im 7/98  bone]
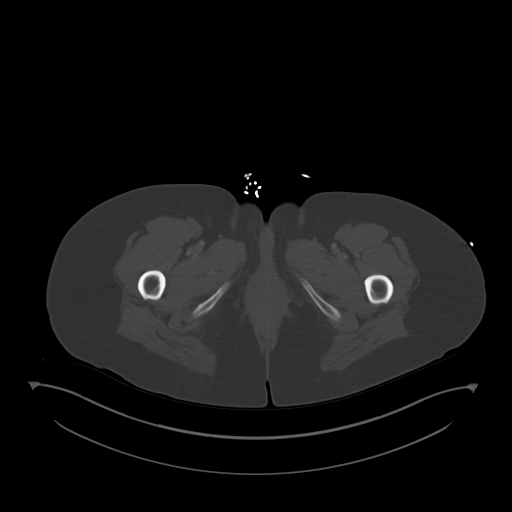
[im 13/98  soft-tissue]
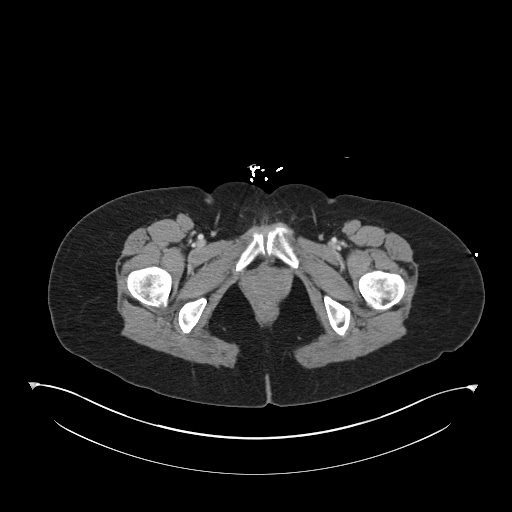
[im 20/98  soft-tissue]
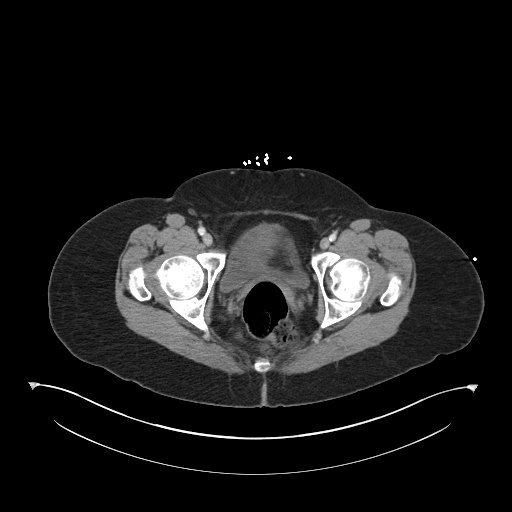
[im 33/98  soft-tissue]
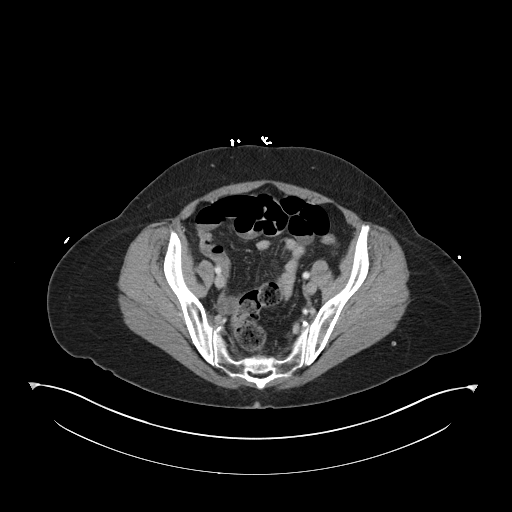
[im 39/98  soft-tissue]
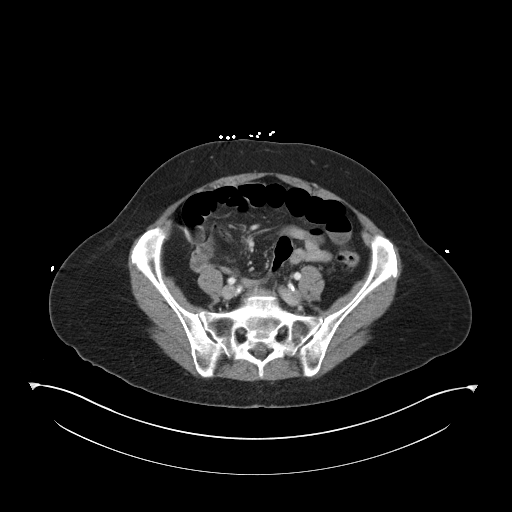
[im 46/98  soft-tissue]
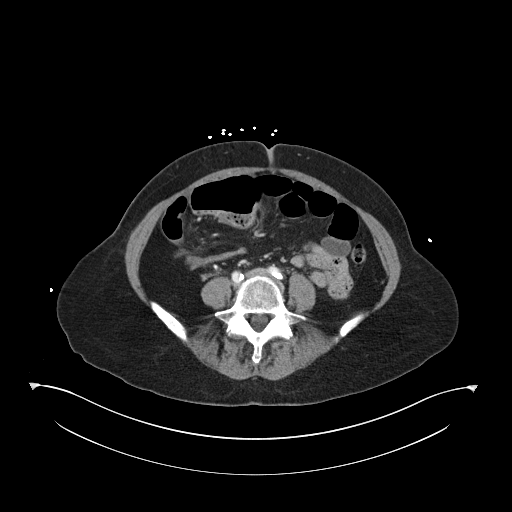
[im 52/98  soft-tissue]
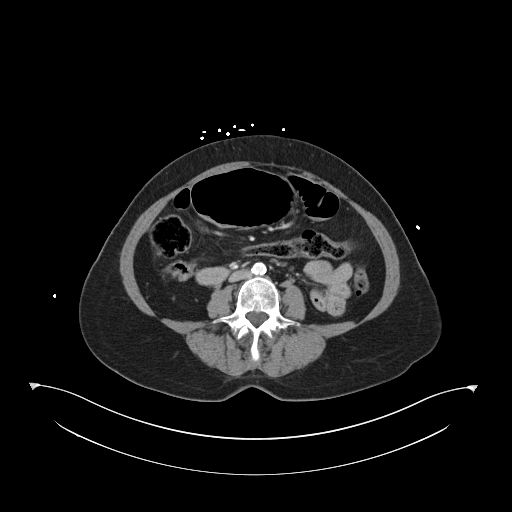
[im 59/98  soft-tissue]
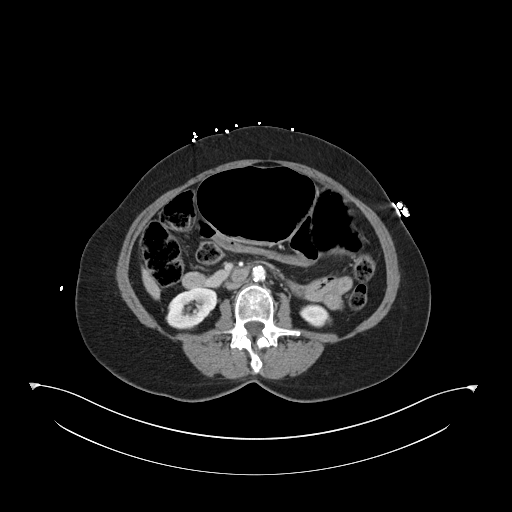
[im 65/98  soft-tissue]
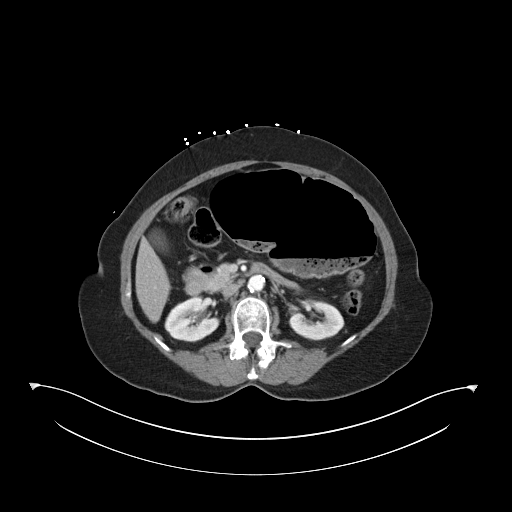
[im 65/98  bone]
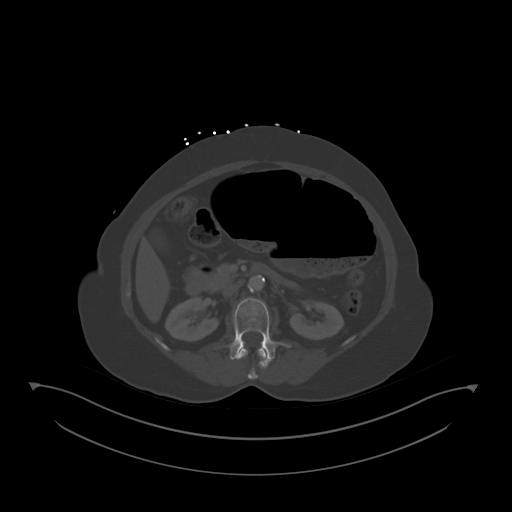
[im 78/98  soft-tissue]
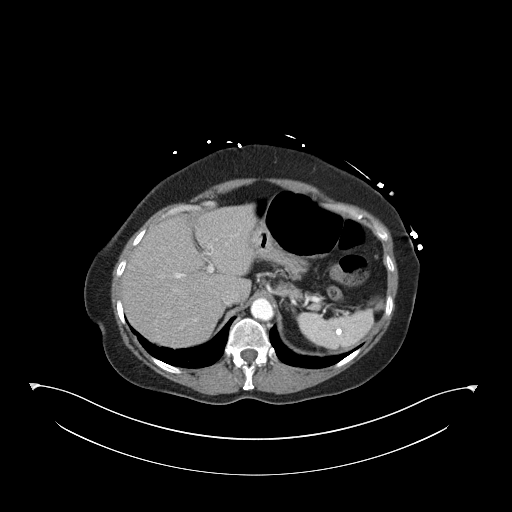
[im 85/98  soft-tissue]
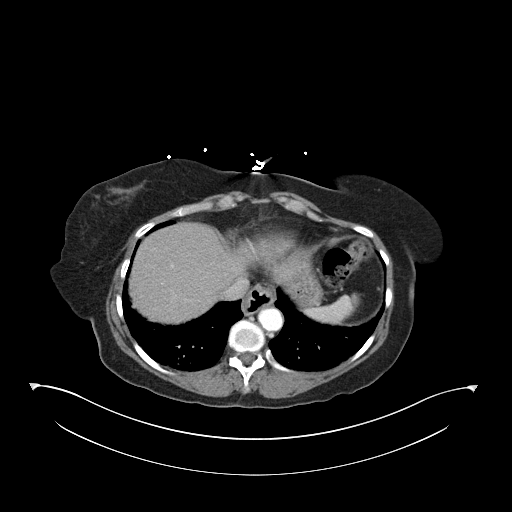
[im 91/98  soft-tissue]
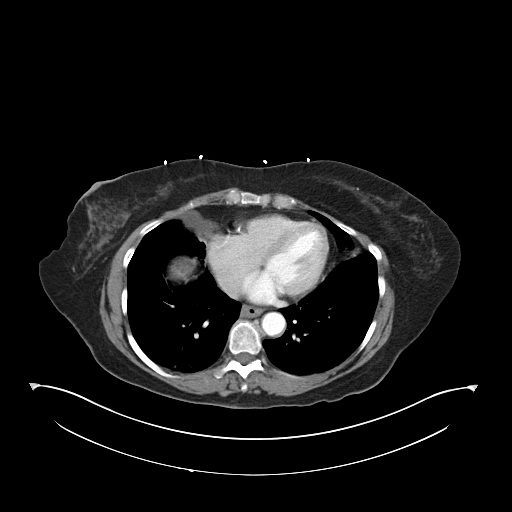

[Series 4: coronal st · coronal · 0.71mm/px · 3 of 141 slices shown]
[im 47/141  soft-tissue]
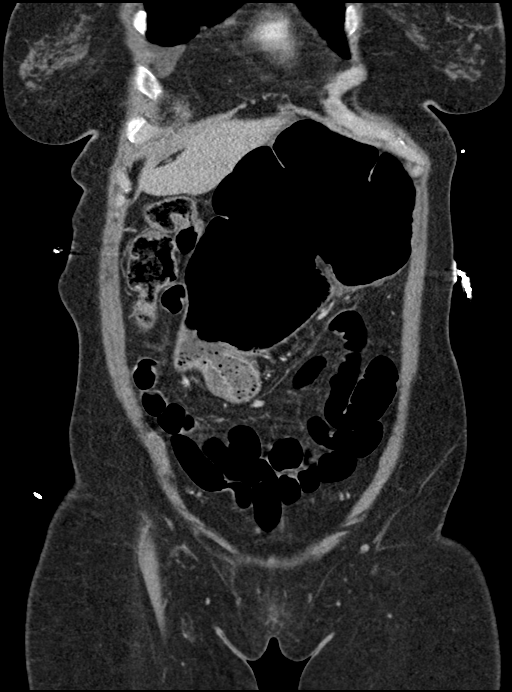
[im 63/141  soft-tissue]
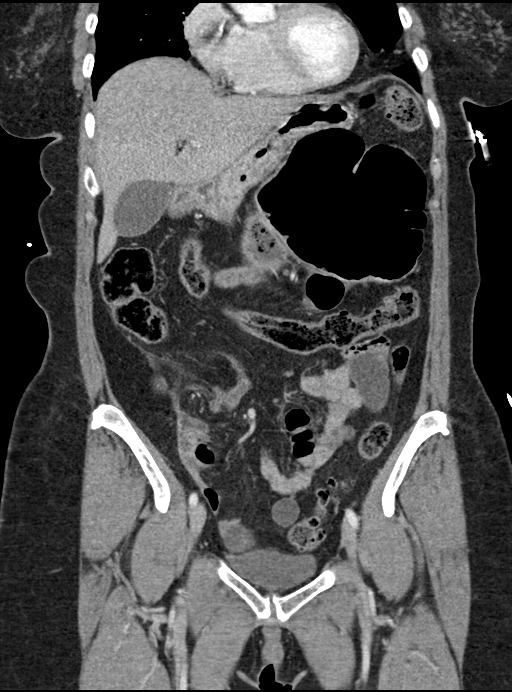
[im 78/141  soft-tissue]
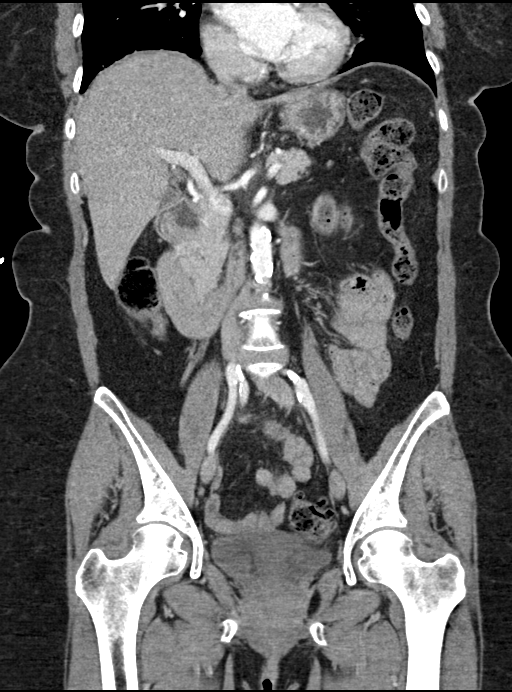

[15 of 46 positions shown; findings below may reference images not displayed]

FINDINGS: Lower chest: 6 mm calcified granuloma in the right middle lobe.
There is a calcification in the right hilum. Findings compatible
with old granulomatous disease. Small amount of bronchiectasis in
the lingula. Dependent atelectasis at the lung bases. No large
pleural effusions. Small hiatal hernia. Mildly lobulated low-density
structure in the right epicardial fat. This structure roughly
measures 4.2 x 2.1 cm on sequence 2, image 8. Attenuation of this
structure is water density.

Hepatobiliary: Normal appearance of the liver, gallbladder and
portal venous system. No biliary dilatation

Pancreas: Unremarkable. No pancreatic ductal dilatation or
surrounding inflammatory changes.

Spleen: Calcifications of spleen compatible with old granulomatous
disease. Negative for splenic enlargement.

Adrenals/Urinary Tract: Normal adrenal glands. Normal urinary
bladder. Normal appearance of both kidneys without hydronephrosis.

Stomach/Bowel: Small hiatal hernia. The cecum is located in the left
upper abdomen and markedly distended, measuring close to 10 cm.
There is mesenteric inflammation and marked narrowing of the colon
in right abdomen on sequence 2, image 56. Right colon is completely
decompressed on sequence 2 image 53. This pattern of disease is
suggestive for cecal volvulus. Normal appearance of the stomach and
small bowel.

Vascular/Lymphatic: Atherosclerotic disease in the abdominal aorta
without aneurysm. The celiac trunk and SMA are patent. Venous
structures are unremarkable. No lymph node enlargement in the
abdomen or pelvis.

Reproductive: Status post hysterectomy. No adnexal masses.

Other: Small amount of ascites in the pelvis. Negative for free air.

Musculoskeletal: No acute bone abnormality.
IMPRESSION: 1. Cecal volvulus. Cecum is markedly distended in the left upper
quadrant with volvulus in the right abdomen as described. Small
amount of pelvic ascites. No evidence for free air.
2. Fluid attenuating structure in right epicardial fat region.
Findings are nonspecific but could represent a pericardial cyst.
3. Evidence for old granulomatous disease.
4. Small hiatal hernia.
5.  Aortic Atherosclerosis ([id number]-BHQ.[id number]).

These results were called by telephone at the time of interpretation
on 10/10/2018 at [DATE] to Dr. PARIJA NAMIRA , who verbally
acknowledged these results.

## 2019-11-08 LAB — LIPID PANEL
Chol/HDL Ratio: 2.9 ratio (ref 0.0–4.4)
Cholesterol, Total: 162 mg/dL (ref 100–199)
HDL: 56 mg/dL (ref >39)
LDL Chol Calc (NIH): 85 mg/dL (ref 0–99)
Triglycerides: 115 mg/dL (ref 0–149)
VLDL Cholesterol Cal: 21 mg/dL (ref 5–40)

## 2019-11-14 ENCOUNTER — Encounter: Payer: Self-pay | Admitting: Internal Medicine

## 2019-11-14 ENCOUNTER — Ambulatory Visit: Payer: Medicare Other | Admitting: Internal Medicine

## 2019-11-14 ENCOUNTER — Other Ambulatory Visit: Payer: Self-pay

## 2019-11-14 VITALS — BP 122/74 | HR 64 | Temp 97.4°F | Ht 63.0 in | Wt 139.8 lb

## 2019-11-14 DIAGNOSIS — T466X5A Adverse effect of antihyperlipidemic and antiarteriosclerotic drugs, initial encounter: Secondary | ICD-10-CM

## 2019-11-14 DIAGNOSIS — Z789 Other specified health status: Secondary | ICD-10-CM

## 2019-11-14 DIAGNOSIS — E785 Hyperlipidemia, unspecified: Secondary | ICD-10-CM

## 2019-11-14 DIAGNOSIS — M791 Myalgia, unspecified site: Secondary | ICD-10-CM

## 2019-11-14 DIAGNOSIS — I2510 Atherosclerotic heart disease of native coronary artery without angina pectoris: Secondary | ICD-10-CM | POA: Diagnosis not present

## 2019-11-14 MED ORDER — SIMVASTATIN 40 MG PO TABS: 40.0000 mg | ORAL_TABLET | Freq: Every day | ORAL | 3 refills | Status: DC

## 2019-11-14 MED ORDER — EZETIMIBE 10 MG PO TABS: 10.0000 mg | ORAL_TABLET | Freq: Every day | ORAL | 3 refills | Status: DC

## 2019-11-14 NOTE — Patient Instructions (Signed)
Medication Instructions:  Your physician recommends that you continue on your current medications as directed. Please refer to the Current Medication list given to you today.  *If you need a refill on your cardiac medications before your next appointment, please call your pharmacy*   Lab Work: FASTING lab work in 6 months to check cholesterol  If you have labs (blood work) drawn today and your tests are completely normal, you will receive your results only by: . MyChart Message (if you have MyChart) OR . A paper copy in the mail If you have any lab test that is abnormal or we need to change your treatment, we will call you to review the results.   Testing/Procedures: NONE   Follow-Up: At [facility], you and your health needs are our priority.  As part of our continuing mission to provide you with exceptional heart care, we have created designated Provider Care Teams.  These Care Teams include your primary Cardiologist (physician) and Advanced Practice Providers (APPs -  Physician Assistants and Nurse Practitioners) who all work together to provide you with the care you need, when you need it.  We recommend signing up for the patient portal called "MyChart".  Sign up information is provided on this After Visit Summary.  MyChart is used to connect with patients for Virtual Visits (Telemedicine).  Patients are able to view lab/test results, encounter notes, upcoming appointments, etc.  Non-urgent messages can be sent to your provider as well.   To learn more about what you can do with MyChart, go to [email or web address].    Your next appointment:   6 month(s) - lipid clinic  The format for your next appointment:   Either In Person or Virtual  Provider:   K. Chad Hilty, MD   Other Instructions   

## 2019-11-14 NOTE — Progress Notes (Signed)
LIPID CLINIC CONSULT NOTE  Chief Complaint:  Dyslipidemia  Primary Care Physician: Marin Comment, FNP  Primary Cardiologist:  Peter [place], MD  HPI:  Sarah Pacheco is a 74 y.o. female who is being seen today for the evaluation of dyslipidemia at the request of Marin Comment, FNP. This is a 74 year old female with a history of dyslipidemia (LDL greater than 200), anxiety, depression and coronary disease.  She presented in early December with substernal chest pain and was found to have inferior ST elevation MI complicated by cardiogenic shock.  She required 2 stents to the proximal RCA however LVEF was normal.  Prior to this her LDL cholesterol had been in the 120s on ezetimibe and simvastatin, however just prior to the procedure LDL was at the low 80s and then 64 the day after, likely owing to the effects of acute MI.  Based on this her Zetia and simvastatin were discontinued and she was switched to rosuvastatin 2 mg daily.  After taking this for several weeks she had significant myalgias and discontinued the medication.  She was seen in follow-up by Azalee Course on December 11 who referred her to the lipid clinic for further evaluation.  11/14/2019  Sarah Pacheco returns today for follow-up.  I saw her virtually at her last office visit.  As outlined above she had coronary artery disease with cardiogenic shock and inferior ST elevation MI.  Her lipids were above target.  We discussed the combination simvastatin and ezetimibe (Vytorin) and she was successfully placed on that.  Her lipids as of December showed marked improvement with an LDL 64, however more recently her LDLs trended back up to 85.  Total cholesterol was 162 and triglycerides were 115.  She notes a little less activity although recently has started to become more active.  She is asymptomatic with that.  She says that her diet tends to be pretty good except for sweets and baked goods which may contain butter and other sources of  saturated fat.  PMHx:  Past Medical History:  Diagnosis Date  . Anxiety   . Arthritis   . Cataract   . Chest pain   . Depression   . GERD (gastroesophageal reflux disease)   . Hyperlipidemia   . Hyperlipidemia with target LDL less than 70 10/25/2015    Past Surgical History:  Procedure Laterality Date  . APPENDECTOMY    . CATARACT EXTRACTION, BILATERAL    . COLONOSCOPY  2017   X2     colon polyps  . CORONARY/GRAFT ACUTE MI REVASCULARIZATION N/A 07/11/2019   Procedure: CORONARY/GRAFT ACUTE MI REVASCULARIZATION;  Surgeon: Swaziland, Peter M, MD;  Location: [facility] INVASIVE CV LAB;  Service: Cardiovascular;  Laterality: N/A;  . LAPAROTOMY N/A 10/10/2018   Procedure: EXPLORATORY LAPAROTOMY RIGHT COLECTOMY;  Surgeon: Emelia Loron, MD;  Location: [facility] ORS;  Service: General;  Laterality: N/A;  . LEFT HEART CATH AND CORONARY ANGIOGRAPHY N/A 07/11/2019   Procedure: LEFT HEART CATH AND CORONARY ANGIOGRAPHY;  Surgeon: Swaziland, Peter M, MD;  Location: [facility] INVASIVE CV LAB;  Service: Cardiovascular;  Laterality: N/A;  . TEMPORARY PACEMAKER N/A 07/11/2019   Procedure: TEMPORARY PACEMAKER;  Surgeon: Swaziland, Peter M, MD;  Location: [facility] INVASIVE CV LAB;  Service: Cardiovascular;  Laterality: N/A;  . VAGINAL HYSTERECTOMY  1973   AUB, fibroids    FAMHx:  Family History  Problem Relation Age of Onset  . Heart attack Father 47       MI  . Heart disease Father   .  Heart disease Sister   . Heart attack Brother 59       MI  . Heart attack Sister   . Heart disease Sister   . Colon cancer Sister 59  . Colon polyps Sister   . Heart disease Brother   . Heart failure Brother   . Colon cancer Brother 27  . Colon polyps Brother   . COPD Brother   . Lung cancer Brother   . Esophageal cancer Neg Hx   . Rectal cancer Neg Hx   . Stomach cancer Neg Hx     SOCHx:   reports that she has quit smoking. Her smoking use included cigarettes. She has a 7.50 pack-year smoking history. She has never used smokeless  tobacco. She reports that she does not drink alcohol or use drugs.  ALLERGIES:  Allergies  Allergen Reactions  . Atorvastatin Other (See Comments)    Cramps in legs  . Codeine Nausea And Vomiting  . Crestor [Rosuvastatin]     myalgia  . Erythromycin Base Other (See Comments)    Sick on the stomach  . Penicillin G Hives    Unknown Did it involve swelling of the face/tongue/throat, SOB, or low BP? no Did it involve sudden or severe rash/hives, skin peeling, or any reaction on the inside of your mouth or nose? yes Did you need to seek medical attention at a hospital or doctor's office? yes When did it last happen?2010 If all above answers are "NO", may proceed with cephalosporin use.     ROS: Pertinent items noted in HPI and remainder of comprehensive ROS otherwise negative.  HOME MEDS: Current Outpatient Medications on File Prior to Visit  Medication Sig Dispense Refill  . acetaminophen (TYLENOL) 325 MG tablet Take 650 mg by mouth every 6 (six) hours as needed for mild pain or headache.    Marland Kitchen aspirin EC 81 MG EC tablet Take 1 tablet (81 mg total) by mouth daily. 90 tablet 3  . buPROPion (WELLBUTRIN XL) 150 MG 24 hr tablet Take 150 mg by mouth daily.    Marland Kitchen ezetimibe (ZETIA) 10 MG tablet Take 10 mg by mouth daily.    . metoprolol tartrate (LOPRESSOR) 25 MG tablet Take 25 mg by mouth daily.    . nitroGLYCERIN (NITROSTAT) 0.4 MG SL tablet Place 1 tablet (0.4 mg total) under the tongue every 5 (five) minutes as needed for chest pain (up to 3 doses. If taking 3rd dose call 911). 25 tablet 3  . pantoprazole (PROTONIX) 40 MG tablet Take 1 tablet (40 mg total) by mouth daily. 30 tablet 6  . simvastatin (ZOCOR) 40 MG tablet Take 40 mg by mouth daily.    . ticagrelor (BRILINTA) 90 MG TABS tablet Take 1 tablet (90 mg total) by mouth 2 (two) times daily. 180 tablet 3  . traZODone (DESYREL) 100 MG tablet Take 100 mg by mouth at bedtime.    . vitamin B-12 (CYANOCOBALAMIN) 100 MCG tablet Take  100 mcg by mouth daily.     No current facility-administered medications on file prior to visit.    LABS/IMAGING: No results found for this or any previous visit (from the past 48 hour(s)). No results found.  LIPID PANEL:    Component Value Date/Time   CHOL 162 11/08/2019 1040   TRIG 115 11/08/2019 1040   HDL 56 11/08/2019 1040   CHOLHDL 2.9 11/08/2019 1040   CHOLHDL 2.4 07/12/2019 0244   VLDL 14 07/12/2019 0244   LDLCALC 85 11/08/2019 1040  WEIGHTS: Wt Readings from Last 3 Encounters:  11/14/19 139 lb 12.8 oz (63.4 kg)  08/14/19 138 lb (62.6 kg)  07/21/19 140 lb 6.4 oz (63.7 kg)    VITALS: BP 122/74   Pulse 64   Temp (!) 97.4 F (36.3 C)   Ht 5\' 3"  (1.6 m)   Wt 139 lb 12.8 oz (63.4 kg)   SpO2 96%   BMI 24.76 kg/m   EXAM: Deferred  EKG: Deferred  ASSESSMENT: 1. CAD with recent inferior STEMI, status post DES x2 to the RCA 2. Recent cardiogenic shock 3. Dyslipidemia with intolerance to high potency Crestor 4. Goal LDL less than 70  PLAN: 1.  Ms.Pacheco his mixed dyslipidemia and had reached a target LDL less than 70 however recently LDL has crept up.  She was intolerant to Crestor but seems to be tolerating the combination simvastatin and ezetimibe.  As she was able to reach target a few months ago, I think further dietary changes and more physical activity will help her get back to that goal.  We discussed cutting back some sweets and other sources of saturated fat in her diet.  Plan follow-up with me in 6 months with repeat lipids.  Pixie Casino, MD, [facility], [place] Director of the Advanced Lipid Disorders &  Cardiovascular Risk Reduction Clinic Diplomate of the American Board of Clinical Lipidology Attending Cardiologist  Direct Dial: [phone]  Fax: [phone]  Website:  [email or web address] 11/14/2019, 9:29 AM

## 2019-12-13 ENCOUNTER — Other Ambulatory Visit: Payer: Self-pay | Admitting: Nurse Practitioner

## 2019-12-13 DIAGNOSIS — E2839 Other primary ovarian failure: Secondary | ICD-10-CM

## 2019-12-13 DIAGNOSIS — R5381 Other malaise: Secondary | ICD-10-CM

## 2019-12-21 NOTE — Progress Notes (Signed)
[place] [facility] Shock Patient Intake Sheet  1. Complete this form for all MI patients presenting with Cardiogenic Shock. 2. This form must be completed by a Cath Lab Super-Tech or Interventionalist. 3. Once completed please EPIC message [place] [facility]   1. Inclusion criteria:  Choose all that apply:  [x]  Symptoms of acute myocardial infarction with ECG and/biomarker evidence of S-T elevation myocardial infarction or non-S-T myocardial infarction.  [x]  Systolic blood pressure < at baseline OR use of inotopes or vasopressors to maintain SBP >67mmHg + LVEDP >=59mmHg  [x]  Evidence of end organ hypoperfusion  [x]  Patient undergoes PCI  2. Exclusion criteria:  Was there a reason to exclude patient from the Clinical Shock Protocol?     (if YES, check reason and rest of form will not need to be filled out)           []  Evidence of anoxic brain injury           []  Unwitnessed out of hospital cardiac arrest or any       cardiac arrest in which return of spontaneous circulation (ROSC) is not achieved in 30 minutes.           []  IABP placed prior to Impella           []  Patient already supported with an Impella           []  Septic, anaphylactic and hemorrhage causes of shock           []  Neurologic and Non-ischemic cause of shock/hypotension (pulmonary embolism, pneumothorax, myocarditis, tamponade, etc.)           []  Active bleeding for which mechanical circulatory   support is contraindicated           []  Recent major surgery for which mechanical circulatory support is contraindicated            []  Mechanical complications of AMI (acute ventricular septal defect (VSD) or acute papillary muscle rupture)           []  Known left ventricular thrombus for which mechanical circulatory support is contraindicated           []  Mechanical aortic prosthetic valve           []  Contraindication to intravenous systemic anticoagulation  []  Yes            [x]  No  3.  Was LVEDP  obtained before PCI and Impella?  []  Yes            [x]  No  If obtained pre PCI/Impella was it over 15 mm? []  Yes            []  No  4.  Was "Severe Shock" identified prior PCI? If YES, select which item was present to identify "severe shock")  []  SBP <70mm  [x]  High dose pressors  []  Shock with SBP 80-90 or low dose pressors only, but with with EF <30% in anterior STEMI or Prox LAD or Left Main  []  Shock with SBP 80-90 or only on low pressors but with EF <20% in interior or lateral MI or RCA or Circ as culpit lesion.  []  Impella placed   []  pre PCI                               []  post PCI  []  Was Right heart cath performed prior to leaving cath lab?                  []   YES        [x]  NO  []  Yes            []  No  5.  "Not Severe Shock" identified prior to PCI?  If NO, then patient is presumed to have had a "severe shock" and rest of questions do not need to be answered)  a. If YES was impella placed? []  Yes            []  No  b. If Impella was placed post PCI, was the patient still in Shock before Impella was placed? []  Yes            [x]  No  c. Was Right heart cath performed before Impella placement? []  Yes            []  No  d. If YES, what were the following values pre-Impella placement? Cardiac Index: Click or tap here to enter text. Cardiac Power: Click or tap here to enter text.  e. If Impella placed, state why.  []  Patient deteriorated into "severe shock" post PCI as defined by criteria of the [place] [facility].  []  CI or CPO Low post PCI by RCA  []  Other, (please briefly explain to the right ? If other, briefly explain here:

## 2020-02-28 ENCOUNTER — Ambulatory Visit
Admission: RE | Admit: 2020-02-28 | Discharge: 2020-02-28 | Disposition: A | Discharge status: Home / Self Care | Payer: Medicare Other | Source: Ambulatory Visit | Attending: Nurse Practitioner | Admitting: Nurse Practitioner

## 2020-02-28 ENCOUNTER — Other Ambulatory Visit: Payer: Self-pay

## 2020-02-28 DIAGNOSIS — E2839 Other primary ovarian failure: Secondary | ICD-10-CM

## 2020-03-15 ENCOUNTER — Telehealth: Payer: Self-pay | Admitting: Internal Medicine

## 2020-03-15 NOTE — Telephone Encounter (Signed)
Mailed lipid panel order w/appointment reminder - due Oct 2021

## 2020-04-04 MED ORDER — METOPROLOL TARTRATE 25 MG PO TABS: 25.0000 mg | ORAL_TABLET | Freq: Every day | ORAL | 3 refills | Status: DC

## 2020-04-04 MED ORDER — TICAGRELOR 90 MG PO TABS: 90.0000 mg | ORAL_TABLET | Freq: Two times a day (BID) | ORAL | 3 refills | Status: DC

## 2020-05-16 NOTE — Progress Notes (Signed)
Cardiology Office Note:    Date:  05/23/2020   ID:  Sarah Pacheco, DOB [birth year], MRN [id number]  PCP:  System, Provider Not In  Cardiologist:  Vivika Poythress Sw[place], MD  Electrophysiologist:  None   Referring MD: Marin Comment, FNP   Chief Complaint  Patient presents with  . Follow-up    6 months.  . Headache  . Shortness of Breath    History of Present Illness:    Sarah Pacheco is a 74 y.o. female with a hx of hyperlipidemia, anxiety, depression and  CAD.  Patient presented to [facility] on 07/11/2019 with substernal chest pain while shopping.  Initially EKG showed slight ST elevation inferiorly however repeat EKG in the ED was diagnostic for inferior STEMI.  Emergent cardiac catheterization obtained on 07/11/2019 showed 100% proximal RCA occlusion, this was treated by 2 overlapping resolute Onyx DES.  EF by LV gram was 55 to 65%.  Prior to the cardiac catheterization, she had cardiogenic shock and bradycardia, this improved after the cardiac catheterization.   Post cardiac catheterization, high-sensitivity troponin trended up to 805.  Hemoglobin A1c was 5.8.  Echocardiogram obtained on the following day showed EF 60 to 65%, mild MR.  After the cardiac catheterization, she was placed on aspirin, metoprolol, Brilinta and Crestor. She was intolerant of Crestor as she had been with lipitor before. She was referred to the lipid clinic. Dr Rennis Golden resumed Vytorin 10/40 mg daily which she had tolerated before. If still not at goal consider PCSK 9 inhibitor.   On follow up she does note she is doing well. She has moved to a house on [address] which is near her son.  She denies any chest pain. She does experience some breathlessness when going up stairs. She is walking regularly.   Past Medical History:  Diagnosis Date  . Anxiety   . Arthritis   . Cataract   . Chest pain   . Depression   . GERD (gastroesophageal reflux disease)   . Hyperlipidemia   . Hyperlipidemia with target LDL  less than 70 10/25/2015    Past Surgical History:  Procedure Laterality Date  . APPENDECTOMY    . CATARACT EXTRACTION, BILATERAL    . COLONOSCOPY  2017   X2     colon polyps  . CORONARY/GRAFT ACUTE MI REVASCULARIZATION N/A 07/11/2019   Procedure: CORONARY/GRAFT ACUTE MI REVASCULARIZATION;  Surgeon: Sw[place]il[place], Chistian Kasler M, MD;  Location: [facility] INVASIVE CV LAB;  Service: Cardiovascular;  Laterality: N/A;  . LAPAROTOMY N/A 10/10/2018   Procedure: EXPLORATORY LAPAROTOMY RIGHT COLECTOMY;  Surgeon: Emelia Loron, MD;  Location: [facility] ORS;  Service: General;  Laterality: N/A;  . LEFT HEART CATH AND CORONARY ANGIOGRAPHY N/A 07/11/2019   Procedure: LEFT HEART CATH AND CORONARY ANGIOGRAPHY;  Surgeon: Swazil[place], Sonakshi Rolland M, MD;  Location: [facility] INVASIVE CV LAB;  Service: Cardiovascular;  Laterality: N/A;  . TEMPORARY PACEMAKER N/A 07/11/2019   Procedure: TEMPORARY PACEMAKER;  Surgeon: [place], Valary Manahan M, MD;  Location[facility] INVASIVE CV LAB;  Service: Cardiovascular;  Laterality: N/A;  . VAGINAL HYSTERECTOMY  1973   AUB, fibroids    Current Medications: Current Meds  Medication Sig  . acetaminophen (TYLENOL) 325 MG tablet Take 650 mg by mouth every 6 (six) hours as needed for mild pain or headache.  Marland Kitchen aspirin EC 81 MG EC tablet Take 1 tablet (81 mg total) by mouth daily.  Marland Kitchen ezetimibe (ZETIA) 10 MG tablet Take 1 tablet (10 mg total) by mouth daily.  . metoprolol  tartrate (LOPRESSOR) 25 MG tablet Take 1 tablet (25 mg total) by mouth daily.  . nitroGLYCERIN (NITROSTAT) 0.4 MG SL tablet Place 1 tablet (0.4 mg total) under the tongue every 5 (five) minutes as needed for chest pain (up to 3 doses. If taking 3rd dose call 911).  . pantoprazole (PROTONIX) 40 MG tablet Take 1 tablet (40 mg total) by mouth daily.  . simvastatin (ZOCOR) 40 MG tablet Take 1 tablet (40 mg total) by mouth daily.  . ticagrelor (BRILINTA) 90 MG TABS tablet Take 1 tablet (90 mg total) by mouth 2 (two) times daily.  . vitamin B-12 (CYANOCOBALAMIN) 100 MCG  tablet Take 100 mcg by mouth daily.  . [DISCONTINUED] buPROPion (WELLBUTRIN XL) 150 MG 24 hr tablet Take 150 mg by mouth daily.  . [DISCONTINUED] traZODone (DESYREL) 100 MG tablet Take 100 mg by mouth at bedtime.     Allergies:   Atorvastatin, Codeine, Crestor [rosuvastatin], Erythromycin base, and Penicillin g   Social History   Socioeconomic History  . Marital status: Divorced    Spouse name: Not on file  . Number of children: Not on file  . Years of education: Not on file  . Highest education level: Not on file  Occupational History  . Not on file  Tobacco Use  . Smoking status: Former Smoker    Packs/day: 0.50    Years: 15.00    Pack years: 7.50    Types: Cigarettes  . Smokeless tobacco: Never Used  . Tobacco comment: Quit around age 42  Vaping Use  . Vaping Use: Never used  Substance and Sexual Activity  . Alcohol use: No  . Drug use: No  . Sexual activity: Not on file  Other Topics Concern  . Not on file  Social History Narrative  . Not on file   Social Determinants of Health   Financial Resource Strain:   . Difficulty of Paying Living Expenses: Not on file  Food Insecurity:   . Worried About Programme researcher, broadcasting/film/video in the Last Year: Not on file  . Ran Out of Food in the Last Year: Not on file  Transportation Needs:   . Lack of Transportation (Medical): Not on file  . Lack of Transportation (Non-Medical): Not on file  Physical Activity:   . Days of Exercise per Week: Not on file  . Minutes of Exercise per Session: Not on file  Stress:   . Feeling of Stress : Not on file  Social Connections:   . Frequency of Communication with Friends and Family: Not on file  . Frequency of Social Gatherings with Friends and Family: Not on file  . Attends Religious Services: Not on file  . Active Member of Clubs or Organizations: Not on file  . Attends Banker Meetings: Not on file  . Marital Status: Not on file     Family History: The patient's family  history includes COPD in her brother; Colon cancer (age of onset: 45) in her brother and sister; Colon polyps in her brother and sister; Heart attack in her sister; Heart attack (age of onset: 47) in her brother; Heart attack (age of onset: 17) in her father; Heart disease in her brother, father, sister, and sister; Heart failure in her brother; Lung cancer in her brother. There is no history of Esophageal cancer, Rectal cancer, or Stomach cancer.  ROS:   Please see the history of present illness.     All other systems reviewed and are negative.  EKGs/Labs/Other  Studies Reviewed:    The following studies were reviewed today:  Cath 07/11/2019  Prox RCA to Mid RCA lesion is 100% stenosed.  Post intervention, there is a 0% residual stenosis.  A drug-eluting stent was successfully placed using a STENT RESOLUTE ONYX [id number].  A drug-eluting stent was successfully placed using a STENT RESOLUTE ONYX [id number].  The left ventricular systolic function is normal.  LV end diastolic pressure is normal.  The left ventricular ejection fraction is 55-65% by visual estimate.   1. 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 Low LVEDP 3. Normal LV function 4. Successful PCI of the RCA with DES x 2. 5. Resolution of cardiogenic shock and bradycardia.  Plan: DAPT for one year. Monitor in ICU. High dose statin. Will initiate beta blocker as BP allows.   EKG:  EKG is not ordered today.    Recent Labs: 07/11/2019: ALT 30 07/12/2019: BUN 9; Creatinine, Ser 0.98; Hemoglobin 9.8; Hemoglobin 9.9; Platelets 248; Platelets 254; Potassium 4.6; Sodium 141  Recent Lipid Panel    Component Value Date/Time   CHOL 175 05/20/2020 0928   TRIG 130 05/20/2020 0928   HDL 55 05/20/2020 0928   CHOLHDL 3.2 05/20/2020 0928   CHOLHDL 2.4 07/12/2019 0244   VLDL 14 07/12/2019 0244   LDLCALC 97 05/20/2020 0928    Physical Exam:    VS:  BP 126/66 (BP Location: Left Arm,  Patient Position: Sitting, Cuff Size: Normal)   Pulse 62   Ht 5\' 3"  (1.6 m)   Wt 140 lb (63.5 kg)   BMI 24.80 kg/m     Wt Readings from Last 3 Encounters:  05/23/20 140 lb (63.5 kg)  11/14/19 139 lb 12.8 oz (63.4 kg)  08/14/19 138 lb (62.6 kg)     GEN:  Well nourished, well developed in no acute distress HEENT: Normal NECK: No JVD; No carotid bruits LYMPHATICS: No lymphadenopathy CARDIAC: RRR, no murmurs, rubs, gallops RESPIRATORY:  Clear to auscultation without rales, wheezing or rhonchi  ABDOMEN: Soft, non-tender, non-distended MUSCULOSKELETAL:  No edema; No deformity  SKIN: Warm and dry, some bruises NEUROLOGIC:  Alert and oriented x 3 PSYCHIATRIC:  Normal affect   ASSESSMENT:    1. Coronary artery disease involving native coronary artery of native heart without angina pectoris   2. Hyperlipidemia with target LDL less than 70   3. Dyspnea on exertion    PLAN:    In order of problems listed above:  1. CAD: s/p inferior STEMI in December 2020.  Underwent DES x2 to RCA.  Normal EF.  Continue aspirin and Brilinta for one year. May stop Brilinta in December. I suspect some of her breathlessness is related to this. We will see.  Encourage aerobic activity.  2. Hyperlipidemia: Intolerant of both Lipitor and Crestor.  Now on Simvastatin and Zetia. LDL 97 is still not at goal. Will follow up with Dr January to discuss additional therapy.  3.   Palpitations. Resolved.     Medication Adjustments/Labs and Tests Ordered: Current medicines are reviewed at length with the patient today.  Concerns regarding medicines are outlined above.  No orders of the defined types were placed in this encounter.  No orders of the defined types were placed in this encounter.   Patient Instructions  You may stop Brilinta in December  Continue your other therapy  Will follow up with Dr January for your lipids.      Signed, Gretel Cantu Rennis Golden, MD  05/23/2020 4:23 PM  [facility]

## 2020-05-21 LAB — LIPID PANEL
Chol/HDL Ratio: 3.2 ratio (ref 0.0–4.4)
Cholesterol, Total: 175 mg/dL (ref 100–199)
HDL: 55 mg/dL (ref >39)
LDL Chol Calc (NIH): 97 mg/dL (ref 0–99)
Triglycerides: 130 mg/dL (ref 0–149)
VLDL Cholesterol Cal: 23 mg/dL (ref 5–40)

## 2020-05-23 ENCOUNTER — Other Ambulatory Visit: Payer: Self-pay

## 2020-05-23 ENCOUNTER — Ambulatory Visit: Payer: Medicare Other | Admitting: Cardiology

## 2020-05-23 ENCOUNTER — Encounter: Payer: Self-pay | Admitting: Cardiology

## 2020-05-23 VITALS — BP 126/66 | HR 62 | Ht 63.0 in | Wt 140.0 lb

## 2020-05-23 DIAGNOSIS — I2510 Atherosclerotic heart disease of native coronary artery without angina pectoris: Secondary | ICD-10-CM | POA: Diagnosis not present

## 2020-05-23 DIAGNOSIS — E785 Hyperlipidemia, unspecified: Secondary | ICD-10-CM | POA: Diagnosis not present

## 2020-05-23 DIAGNOSIS — R0600 Dyspnea, unspecified: Secondary | ICD-10-CM | POA: Diagnosis not present

## 2020-05-23 DIAGNOSIS — R0609 Other forms of dyspnea: Secondary | ICD-10-CM

## 2020-05-23 NOTE — Patient Instructions (Signed)
You may stop Brilinta in December  Continue your other therapy  Will follow up with Dr Rennis Golden for your lipids.

## 2020-05-24 ENCOUNTER — Ambulatory Visit: Payer: Medicare Other | Admitting: Cardiology

## 2020-06-01 ENCOUNTER — Other Ambulatory Visit: Payer: Self-pay

## 2020-06-01 ENCOUNTER — Encounter (HOSPITAL_COMMUNITY): Payer: Self-pay | Admitting: Emergency Medicine

## 2020-06-01 ENCOUNTER — Emergency Department (HOSPITAL_COMMUNITY)
Admission: EM | Admit: 2020-06-01 | Discharge: 2020-06-01 | Disposition: A | Discharge status: Home / Self Care | Payer: Medicare Other | Source: Home / Self Care | Attending: Emergency Medicine | Admitting: Emergency Medicine

## 2020-06-01 ENCOUNTER — Emergency Department (HOSPITAL_COMMUNITY): Payer: Medicare Other

## 2020-06-01 DIAGNOSIS — I2510 Atherosclerotic heart disease of native coronary artery without angina pectoris: Secondary | ICD-10-CM | POA: Insufficient documentation

## 2020-06-01 DIAGNOSIS — Z7982 Long term (current) use of aspirin: Secondary | ICD-10-CM | POA: Insufficient documentation

## 2020-06-01 DIAGNOSIS — R0602 Shortness of breath: Secondary | ICD-10-CM | POA: Insufficient documentation

## 2020-06-01 DIAGNOSIS — Z951 Presence of aortocoronary bypass graft: Secondary | ICD-10-CM | POA: Insufficient documentation

## 2020-06-01 DIAGNOSIS — R059 Cough, unspecified: Secondary | ICD-10-CM | POA: Insufficient documentation

## 2020-06-01 DIAGNOSIS — R0600 Dyspnea, unspecified: Secondary | ICD-10-CM | POA: Insufficient documentation

## 2020-06-01 DIAGNOSIS — R072 Precordial pain: Secondary | ICD-10-CM

## 2020-06-01 DIAGNOSIS — Z87891 Personal history of nicotine dependence: Secondary | ICD-10-CM | POA: Insufficient documentation

## 2020-06-01 LAB — CBC
HCT: 34.1 % — ABNORMAL LOW (ref 36.0–46.0)
Hemoglobin: 10.4 g/dL — ABNORMAL LOW (ref 12.0–15.0)
MCH: 26.8 pg (ref 26.0–34.0)
MCHC: 30.5 g/dL (ref 30.0–36.0)
MCV: 87.9 fL (ref 80.0–100.0)
Platelets: 288 10*3/uL (ref 150–400)
RBC: 3.88 MIL/uL (ref 3.87–5.11)
RDW: 14.6 % (ref 11.5–15.5)
WBC: 7.5 10*3/uL (ref 4.0–10.5)
nRBC: 0 % (ref 0.0–0.2)

## 2020-06-01 LAB — BASIC METABOLIC PANEL
Anion gap: 8 (ref 5–15)
BUN: 20 mg/dL (ref 8–23)
CO2: 24 mmol/L (ref 22–32)
Calcium: 9.1 mg/dL (ref 8.9–10.3)
Chloride: 109 mmol/L (ref 98–111)
Creatinine, Ser: 1.05 mg/dL — ABNORMAL HIGH (ref 0.44–1.00)
GFR, Estimated: 56 mL/min — ABNORMAL LOW (ref >60)
Glucose, Bld: 90 mg/dL (ref 70–99)
Potassium: 3.8 mmol/L (ref 3.5–5.1)
Sodium: 141 mmol/L (ref 135–145)

## 2020-06-01 LAB — TROPONIN I (HIGH SENSITIVITY)
Troponin I (High Sensitivity): 4 ng/L (ref <18)
Troponin I (High Sensitivity): 6 ng/L (ref <18)

## 2020-06-01 NOTE — ED Provider Notes (Signed)
[facility] EMERGENCY DEPARTMENT Provider Note   CSN: [id number] Arrival date & time: 06/01/20  1357     History Chief Complaint  Patient presents with  . Chest Pain    KEIMORA SWARTOUT is a 74 y.o. female.  The history is provided by the patient and medical records.  Chest Pain  OTHELLO SGROI is a 74 y.o. female who presents to the Emergency Department complaining of chest pain. She presents the emergency department complaining of chest pain that radiates to bilateral upper extremities. Symptoms began three days ago. It is worse when she lays down flat at night to go to bed. Pain does improve with nitroglycerin. She required one Nitro two nights ago and two nitros last night. Symptoms are similar to her prior MI the required stent placement in December 2020. Since her stent placement she has experienced persistent shortness of breath and dyspnea on exertion. Her dyspnea is at her baseline. She has not required any nitroglycerin or had recurrent chest pain until a few nights ago. She denies any fevers. She is experiencing night sweats for the last few nights. She has a mild cough. No nausea, vomiting, abdominal pain, leg swelling or pain. No history of DVT/PE. She is not on any hormone medications.    Past Medical History:  Diagnosis Date  . Anxiety   . Arthritis   . Cataract   . Chest pain   . Depression   . GERD (gastroesophageal reflux disease)   . Hyperlipidemia   . Hyperlipidemia with target LDL less than 70 10/25/2015    Patient Active Problem List   Diagnosis Date Noted  . CAD in native artery 07/13/2019  . Cardiogenic shock (HCC) 07/13/2019  . Bradycardia 07/13/2019  . Mild anemia 07/13/2019  . Hypokalemia 07/13/2019  . Pre-diabetes 07/13/2019  . AKI (acute kidney injury) (HCC) 07/13/2019  . STEMI involving right coronary artery (HCC) 07/11/2019  . Ileus, postoperative (HCC) 10/21/2018  . Postoperative ileus (HCC) 10/21/2018  . Cecal volvulus  (HCC) 10/10/2018  . Hyperlipidemia with target LDL less than 70 10/25/2015    Past Surgical History:  Procedure Laterality Date  . APPENDECTOMY    . CATARACT EXTRACTION, BILATERAL    . COLONOSCOPY  2017   X2     colon polyps  . CORONARY/GRAFT ACUTE MI REVASCULARIZATION N/A 07/11/2019   Procedure: CORONARY/GRAFT ACUTE MI REVASCULARIZATION;  Surgeon: Swaziland, Peter M, MD;  Location: [facility] INVASIVE CV LAB;  Service: Cardiovascular;  Laterality: N/A;  . LAPAROTOMY N/A 10/10/2018   Procedure: EXPLORATORY LAPAROTOMY RIGHT COLECTOMY;  Surgeon: Emelia Loron, MD;  Location: [facility] ORS;  Service: General;  Laterality: N/A;  . LEFT HEART CATH AND CORONARY ANGIOGRAPHY N/A 07/11/2019   Procedure: LEFT HEART CATH AND CORONARY ANGIOGRAPHY;  Surgeon: Swaziland, Peter M, MD;  Location: [facility] INVASIVE CV LAB;  Service: Cardiovascular;  Laterality: N/A;  . TEMPORARY PACEMAKER N/A 07/11/2019   Procedure: TEMPORARY PACEMAKER;  Surgeon: Swaziland, Peter M, MD;  Location: [facility] INVASIVE CV LAB;  Service: Cardiovascular;  Laterality: N/A;  . VAGINAL HYSTERECTOMY  1973   AUB, fibroids     OB History    Gravida  2   Para  2   Term      Preterm      AB      Living  2     SAB      TAB      Ectopic      Multiple      Live Births  2           Family History  Problem Relation Age of Onset  . Heart attack Father 58       MI  . Heart disease Father   . Heart disease Sister   . Heart attack Brother 59       MI  . Heart attack Sister   . Heart disease Sister   . Colon cancer Sister 48  . Colon polyps Sister   . Heart disease Brother   . Heart failure Brother   . Colon cancer Brother 79  . Colon polyps Brother   . COPD Brother   . Lung cancer Brother   . Esophageal cancer Neg Hx   . Rectal cancer Neg Hx   . Stomach cancer Neg Hx     Social History   Tobacco Use  . Smoking status: Former Smoker    Packs/day: 0.50    Years: 15.00    Pack years: 7.50    Types: Cigarettes  . Smokeless tobacco:  Never Used  . Tobacco comment: Quit around age 40  Vaping Use  . Vaping Use: Never used  Substance Use Topics  . Alcohol use: No  . Drug use: No    Home Medications Prior to Admission medications   Medication Sig Start Date End Date Taking? Authorizing Provider  acetaminophen (TYLENOL) 325 MG tablet Take 650 mg by mouth every 6 (six) hours as needed for mild pain or headache.   Yes [provider]  aspirin EC 81 MG EC tablet Take 1 tablet (81 mg total) by mouth daily. 07/14/19  Yes Dunn, Dayna N, PA-C  ezetimibe (ZETIA) 10 MG tablet Take 1 tablet (10 mg total) by mouth daily. 11/14/19  Yes Hilty, Lisette Abu, MD  metoprolol tartrate (LOPRESSOR) 25 MG tablet Take 1 tablet (25 mg total) by mouth daily. 04/04/20  Yes [place], Peter M, MD  nitroGLYCERIN (NITROSTAT) 0.4 MG SL tablet Place 1 tablet (0.4 mg total) under the tongue every 5 (five) minutes as needed for chest pain (up to 3 doses. If taking 3rd dose call 911). 07/13/19  Yes Dunn, Dayna N, PA-C  pantoprazole (PROTONIX) 40 MG tablet Take 1 tablet (40 mg total) by mouth daily. 08/16/18  Yes Lynann Bologna, MD  simvastatin (ZOCOR) 40 MG tablet Take 1 tablet (40 mg total) by mouth daily. 11/14/19  Yes Hilty, Lisette Abu, MD  ticagrelor (BRILINTA) 90 MG TABS tablet Take 1 tablet (90 mg total) by mouth 2 (two) times daily. 04/04/20  Yes [place], Peter M, MD  vitamin B-12 (CYANOCOBALAMIN) 100 MCG tablet Take 100 mcg by mouth daily.   Yes [provider]    Allergies    Atorvastatin, Codeine, Erythromycin base, Penicillin g, and Rosuvastatin  Review of Systems   Review of Systems  Cardiovascular: Positive for chest pain.  All other systems reviewed and are negative.   Physical Exam Updated Vital Signs BP 108/87   Pulse (!) 59   Temp 98 F (36.7 C) (Oral)   Resp 16   Ht 5\' 3"  (1.6 m)   Wt 63.5 kg   SpO2 99%   BMI 24.80 kg/m   Physical Exam Vitals and nursing note reviewed.  Constitutional:      Appearance: She is  well-developed.  HENT:     Head: Normocephalic and atraumatic.  Cardiovascular:     Rate and Rhythm: Normal rate and regular rhythm.     Heart sounds: No murmur heard.   Pulmonary:  Effort: Pulmonary effort is normal. No respiratory distress.     Breath sounds: Normal breath sounds.  Abdominal:     Palpations: Abdomen is soft.     Tenderness: There is no abdominal tenderness. There is no guarding or rebound.  Musculoskeletal:        General: No swelling or tenderness.  Skin:    General: Skin is warm and dry.  Neurological:     Mental Status: She is alert and oriented to person, place, and time.  Psychiatric:        Behavior: Behavior normal.     ED Results / Procedures / Treatments   Labs (all labs ordered are listed, but only abnormal results are displayed) Labs Reviewed  BASIC METABOLIC PANEL - Abnormal; Notable for the following components:      Result Value   Creatinine, Ser 1.05 (*)    GFR, Estimated 56 (*)    All other components within normal limits  CBC - Abnormal; Notable for the following components:   Hemoglobin 10.4 (*)    HCT 34.1 (*)    All other components within normal limits  TROPONIN I (HIGH SENSITIVITY)  TROPONIN I (HIGH SENSITIVITY)    EKG EKG Interpretation  Date/Time:  Saturday June 01 2020 16:13:15 EDT Ventricular Rate:  57 PR Interval:  132 QRS Duration: 91 QT Interval:  467 QTC Calculation: 455 R Axis:   65 Text Interpretation: Sinus rhythm Low voltage, precordial leads Confirmed by Tilden Fossa [phone]) on 06/01/2020 6:23:05 PM   Radiology DG Chest 2 View  Result Date: 06/01/2020 CLINICAL DATA:  Chest pain. EXAM: CHEST - 2 VIEW COMPARISON:  None. FINDINGS: The heart size and mediastinal contours are within normal limits. There is no evidence of pulmonary edema, consolidation, pneumothorax, nodule or pleural fluid. The visualized skeletal structures are unremarkable. IMPRESSION: No active cardiopulmonary disease.  Electronically Signed   By: Irish Lack M.D.   On: 06/01/2020 14:49    Procedures Procedures (including critical care time)  Medications Ordered in ED Medications - No data to display  ED Course  I have reviewed the triage vital signs and the nursing notes.  Pertinent labs & imaging results that were available during my care of the patient were reviewed by me and considered in my medical decision making (see chart for details).    MDM Rules/Calculators/A&P                         Patient with history of coronary artery disease status post stenting in December 2020 here for evaluation of chest pain for the last three nights. Pain has been nitrate responsive but no significant change with activity. On ED arrival patient is in no acute distress. EKG without acute ischemic changes in troponin is negative times two. Patient was laying supine in the department with no recurrence of her chest pain. Unclear source of her pain, seems slightly atypical for cardiac chest pain given pain only occurs at night but there is some concern due to my true responsiveness. Discussed with on-call cardiologist, Dr.Ugowe, who recommends discharge home with outpatient cardiology follow-up and return precautions.  Final Clinical Impression(s) / ED Diagnoses Final diagnoses:  Precordial pain    Rx / DC Orders ED Discharge Orders    None       Tilden Fossa, MD 06/01/20 2049

## 2020-06-01 NOTE — ED Triage Notes (Signed)
Pt reports intermittent chest pain x 2 days that is worse at night with SOB and dizziness.  Denies nausea/vomiiting.  Denies pain at present.

## 2020-06-01 NOTE — Discharge Instructions (Signed)
Increase your Protonix to twice daily for the next week. Get rechecked immediately if you have new or concerning symptoms.

## 2020-06-01 NOTE — ED Notes (Signed)
Denies CP, only heaviness at this time. Experienced 9/10 pain this AM that required nitro. Has taken 4 total nitro in past 2 days, always provides relief per pt. Pain can be elicited by lying flat, also endorsed SOB at this time, worse with exertion.

## 2020-06-01 NOTE — ED Notes (Addendum)
Patient verbalizes understanding of discharge instructions. Opportunity for questioning and answers were provided. Armband removed by staff, pt discharged from ED to home via POV (friend. Ambulatory, a&ox4

## 2020-06-01 NOTE — ED Notes (Signed)
Patient transported to X-ray 

## 2020-06-03 ENCOUNTER — Inpatient Hospital Stay (HOSPITAL_COMMUNITY)
Admission: AD | Admit: 2020-06-03 | Discharge: 2020-06-05 | DRG: 251 | Disposition: A | Discharge status: Home / Self Care | Payer: Medicare Other | Source: Ambulatory Visit | Attending: Cardiology | Admitting: Cardiology

## 2020-06-03 ENCOUNTER — Encounter (HOSPITAL_COMMUNITY): Payer: Self-pay | Admitting: Cardiology

## 2020-06-03 ENCOUNTER — Other Ambulatory Visit: Payer: Self-pay

## 2020-06-03 DIAGNOSIS — Z9049 Acquired absence of other specified parts of digestive tract: Secondary | ICD-10-CM | POA: Diagnosis not present

## 2020-06-03 DIAGNOSIS — R7303 Prediabetes: Secondary | ICD-10-CM | POA: Diagnosis present

## 2020-06-03 DIAGNOSIS — E7800 Pure hypercholesterolemia, unspecified: Secondary | ICD-10-CM

## 2020-06-03 DIAGNOSIS — I10 Essential (primary) hypertension: Secondary | ICD-10-CM | POA: Diagnosis present

## 2020-06-03 DIAGNOSIS — F32A Depression, unspecified: Secondary | ICD-10-CM | POA: Diagnosis present

## 2020-06-03 DIAGNOSIS — Z955 Presence of coronary angioplasty implant and graft: Secondary | ICD-10-CM

## 2020-06-03 DIAGNOSIS — I214 Non-ST elevation (NSTEMI) myocardial infarction: Secondary | ICD-10-CM | POA: Diagnosis not present

## 2020-06-03 DIAGNOSIS — I2510 Atherosclerotic heart disease of native coronary artery without angina pectoris: Secondary | ICD-10-CM | POA: Diagnosis present

## 2020-06-03 DIAGNOSIS — F419 Anxiety disorder, unspecified: Secondary | ICD-10-CM | POA: Diagnosis present

## 2020-06-03 DIAGNOSIS — K219 Gastro-esophageal reflux disease without esophagitis: Secondary | ICD-10-CM | POA: Diagnosis present

## 2020-06-03 DIAGNOSIS — E785 Hyperlipidemia, unspecified: Secondary | ICD-10-CM | POA: Diagnosis not present

## 2020-06-03 DIAGNOSIS — Z9861 Coronary angioplasty status: Secondary | ICD-10-CM | POA: Diagnosis present

## 2020-06-03 DIAGNOSIS — Z79899 Other long term (current) drug therapy: Secondary | ICD-10-CM

## 2020-06-03 DIAGNOSIS — I252 Old myocardial infarction: Secondary | ICD-10-CM | POA: Diagnosis not present

## 2020-06-03 DIAGNOSIS — Z7901 Long term (current) use of anticoagulants: Secondary | ICD-10-CM | POA: Diagnosis not present

## 2020-06-03 DIAGNOSIS — Z87891 Personal history of nicotine dependence: Secondary | ICD-10-CM | POA: Diagnosis not present

## 2020-06-03 DIAGNOSIS — Z7982 Long term (current) use of aspirin: Secondary | ICD-10-CM | POA: Diagnosis not present

## 2020-06-03 DIAGNOSIS — Z20822 Contact with and (suspected) exposure to covid-19: Secondary | ICD-10-CM | POA: Diagnosis present

## 2020-06-03 DIAGNOSIS — I200 Unstable angina: Secondary | ICD-10-CM | POA: Diagnosis not present

## 2020-06-03 HISTORY — DX: Personal history of other medical treatment: Z92.89

## 2020-06-03 HISTORY — DX: Atherosclerotic heart disease of native coronary artery without angina pectoris: I25.10

## 2020-06-03 LAB — CBC WITH DIFFERENTIAL/PLATELET
Abs Immature Granulocytes: 0.03 10*3/uL (ref 0.00–0.07)
Basophils Absolute: 0.1 10*3/uL (ref 0.0–0.1)
Basophils Relative: 1 %
Eosinophils Absolute: 0.1 10*3/uL (ref 0.0–0.5)
Eosinophils Relative: 1 %
HCT: 33.3 % — ABNORMAL LOW (ref 36.0–46.0)
Hemoglobin: 10.3 g/dL — ABNORMAL LOW (ref 12.0–15.0)
Immature Granulocytes: 0 %
Lymphocytes Relative: 41 %
Lymphs Abs: 3.4 10*3/uL (ref 0.7–4.0)
MCH: 26.7 pg (ref 26.0–34.0)
MCHC: 30.9 g/dL (ref 30.0–36.0)
MCV: 86.3 fL (ref 80.0–100.0)
Monocytes Absolute: 0.9 10*3/uL (ref 0.1–1.0)
Monocytes Relative: 11 %
Neutro Abs: 3.8 10*3/uL (ref 1.7–7.7)
Neutrophils Relative %: 46 %
Platelets: 282 10*3/uL (ref 150–400)
RBC: 3.86 MIL/uL — ABNORMAL LOW (ref 3.87–5.11)
RDW: 14.6 % (ref 11.5–15.5)
WBC: 8.3 10*3/uL (ref 4.0–10.5)
nRBC: 0 % (ref 0.0–0.2)

## 2020-06-03 LAB — COMPREHENSIVE METABOLIC PANEL
ALT: 13 U/L (ref 0–44)
AST: 21 U/L (ref 15–41)
Albumin: 3.9 g/dL (ref 3.5–5.0)
Alkaline Phosphatase: 56 U/L (ref 38–126)
Anion gap: 8 (ref 5–15)
BUN: 15 mg/dL (ref 8–23)
CO2: 22 mmol/L (ref 22–32)
Calcium: 9.2 mg/dL (ref 8.9–10.3)
Chloride: 110 mmol/L (ref 98–111)
Creatinine, Ser: 1.2 mg/dL — ABNORMAL HIGH (ref 0.44–1.00)
GFR, Estimated: 48 mL/min — ABNORMAL LOW (ref >60)
Glucose, Bld: 87 mg/dL (ref 70–99)
Potassium: 4.1 mmol/L (ref 3.5–5.1)
Sodium: 140 mmol/L (ref 135–145)
Total Bilirubin: 0.9 mg/dL (ref 0.3–1.2)
Total Protein: 6.8 g/dL (ref 6.5–8.1)

## 2020-06-03 LAB — RESP PANEL BY RT PCR (RSV, FLU A&B, COVID)
Influenza A by PCR: NEGATIVE
Influenza B by PCR: NEGATIVE
Respiratory Syncytial Virus by PCR: NEGATIVE
SARS Coronavirus 2 by RT PCR: NEGATIVE

## 2020-06-03 LAB — TROPONIN I (HIGH SENSITIVITY): Troponin I (High Sensitivity): 9 ng/L (ref <18)

## 2020-06-03 LAB — MAGNESIUM: Magnesium: 2.2 mg/dL (ref 1.7–2.4)

## 2020-06-03 MED ORDER — TICAGRELOR 90 MG PO TABS
90.0000 mg | ORAL_TABLET | Freq: Two times a day (BID) | ORAL | Status: DC
  Administered 2020-06-03 – 2020-06-05 (×3): 90 mg via ORAL
  Filled 2020-06-03 (×3): qty 1

## 2020-06-03 MED ORDER — SODIUM CHLORIDE 0.9 % IV SOLN: 250.0000 mL | INTRAVENOUS | Status: DC | PRN

## 2020-06-03 MED ORDER — HEPARIN (PORCINE) 25000 UT/250ML-% IV SOLN
750.0000 [IU]/h | INTRAVENOUS | Status: DC
  Administered 2020-06-03: 750 [IU]/h via INTRAVENOUS
  Filled 2020-06-03: qty 250

## 2020-06-03 MED ORDER — ONDANSETRON HCL 4 MG/2ML IJ SOLN
4.0000 mg | Freq: Four times a day (QID) | INTRAMUSCULAR | Status: DC | PRN
  Administered 2020-06-04: 4 mg via INTRAVENOUS

## 2020-06-03 MED ORDER — SODIUM CHLORIDE 0.9% FLUSH: 3.0000 mL | INTRAVENOUS | Status: DC | PRN

## 2020-06-03 MED ORDER — PANTOPRAZOLE SODIUM 40 MG PO TBEC
40.0000 mg | DELAYED_RELEASE_TABLET | Freq: Every day | ORAL | Status: DC
  Administered 2020-06-03 – 2020-06-05 (×3): 40 mg via ORAL
  Filled 2020-06-03 (×3): qty 1

## 2020-06-03 MED ORDER — SIMVASTATIN 20 MG PO TABS
40.0000 mg | ORAL_TABLET | Freq: Every day | ORAL | Status: DC
  Administered 2020-06-03 – 2020-06-04 (×2): 40 mg via ORAL
  Filled 2020-06-03 (×2): qty 2

## 2020-06-03 MED ORDER — ACETAMINOPHEN 325 MG PO TABS
650.0000 mg | ORAL_TABLET | ORAL | Status: DC | PRN
  Administered 2020-06-04: 650 mg via ORAL
  Filled 2020-06-03: qty 2

## 2020-06-03 MED ORDER — HEPARIN BOLUS VIA INFUSION
3800.0000 [IU] | Freq: Once | INTRAVENOUS | Status: AC
  Administered 2020-06-03: 3800 [IU] via INTRAVENOUS
  Filled 2020-06-03: qty 3800

## 2020-06-03 MED ORDER — ASPIRIN EC 81 MG PO TBEC
81.0000 mg | DELAYED_RELEASE_TABLET | Freq: Every day | ORAL | Status: DC
  Administered 2020-06-03 – 2020-06-05 (×2): 81 mg via ORAL
  Filled 2020-06-03 (×2): qty 1

## 2020-06-03 MED ORDER — TICAGRELOR 90 MG PO TABS
90.0000 mg | ORAL_TABLET | ORAL | Status: AC
  Administered 2020-06-04: 90 mg via ORAL
  Filled 2020-06-03: qty 1

## 2020-06-03 MED ORDER — ASPIRIN 81 MG PO CHEW
81.0000 mg | CHEWABLE_TABLET | ORAL | Status: AC
  Administered 2020-06-04: 81 mg via ORAL
  Filled 2020-06-03: qty 1

## 2020-06-03 MED ORDER — METOPROLOL TARTRATE 12.5 MG HALF TABLET
12.5000 mg | ORAL_TABLET | Freq: Two times a day (BID) | ORAL | Status: DC
  Administered 2020-06-04 (×2): 12.5 mg via ORAL
  Filled 2020-06-03 (×2): qty 1

## 2020-06-03 MED ORDER — NITROGLYCERIN IN D5W 200-5 MCG/ML-% IV SOLN
0.0000 ug/min | INTRAVENOUS | Status: DC
  Administered 2020-06-03: 5 ug/min via INTRAVENOUS
  Administered 2020-06-03: 10 ug/min via INTRAVENOUS
  Filled 2020-06-03: qty 250

## 2020-06-03 MED ORDER — EZETIMIBE 10 MG PO TABS
10.0000 mg | ORAL_TABLET | Freq: Every day | ORAL | Status: DC
  Administered 2020-06-03 – 2020-06-04 (×2): 10 mg via ORAL
  Filled 2020-06-03 (×2): qty 1

## 2020-06-03 MED ORDER — SODIUM CHLORIDE 0.9 % WEIGHT BASED INFUSION
3.0000 mL/kg/h | INTRAVENOUS | Status: DC
  Administered 2020-06-04: 3 mL/kg/h via INTRAVENOUS

## 2020-06-03 MED ORDER — SODIUM CHLORIDE 0.9% FLUSH
3.0000 mL | Freq: Two times a day (BID) | INTRAVENOUS | Status: DC
  Administered 2020-06-04: 3 mL via INTRAVENOUS

## 2020-06-03 MED ORDER — NITROGLYCERIN 0.4 MG SL SUBL: 0.4000 mg | SUBLINGUAL_TABLET | SUBLINGUAL | Status: DC | PRN

## 2020-06-03 MED ORDER — SODIUM CHLORIDE 0.9 % WEIGHT BASED INFUSION
1.0000 mL/kg/h | INTRAVENOUS | Status: DC
  Administered 2020-06-04: 1 mL/kg/h via INTRAVENOUS

## 2020-06-03 NOTE — H&P (Signed)
History & Physical    Patient ID: Sarah Pacheco MRN: [id number], DOB/AGE: 1946/03/12   Admit date: 06/03/2020   Primary Physician: [facility] Primary Cardiologist: Peter Swaziland, MD  Patient Profile    74 y/o ? w/ a h/o CAD s/p prior Inf STEMI complicated by cardiogenic shock HL, depression, anxiety, GERD, and arthritis, who presents from home for direct admission secondary to intermittent chest pain/unstable angina since 10/20.  Past Medical History        Past Medical History:  Diagnosis Date  . Anxiety   . Arthritis   . CAD (coronary artery disease)    a. 07/2019 Inf STEMI/VF Arrest/CGS/PCI: LM nl, LADmin irregs, LCX nl, RCA 100p/m (2.75x38 & 2.75x26 Resolute Onyx DESs).  . Cataract   . Chest pain   . Depression   . GERD (gastroesophageal reflux disease)   . History of echocardiogram    a. 07/2019 Echo: EF 60-65%, no rwma, nl RV size/fxn, mildly dil LA, mild MR.  Marland Kitchen Hyperlipidemia   . Hyperlipidemia with target LDL less than 70 10/25/2015         Past Surgical History:  Procedure Laterality Date  . APPENDECTOMY    . CATARACT EXTRACTION, BILATERAL    . COLONOSCOPY  2017   X2     colon polyps  . CORONARY/GRAFT ACUTE MI REVASCULARIZATION N/A 07/11/2019   Procedure: CORONARY/GRAFT ACUTE MI REVASCULARIZATION;  Surgeon: Swaziland, Peter M, MD;  Location: [facility] INVASIVE CV LAB;  Service: Cardiovascular;  Laterality: N/A;  . LAPAROTOMY N/A 10/10/2018   Procedure: EXPLORATORY LAPAROTOMY RIGHT COLECTOMY;  Surgeon: Emelia Loron, MD;  Location: [facility] ORS;  Service: General;  Laterality: N/A;  . LEFT HEART CATH AND CORONARY ANGIOGRAPHY N/A 07/11/2019   Procedure: LEFT HEART CATH AND CORONARY ANGIOGRAPHY;  Surgeon: Swaziland, Peter M, MD;  Location: [facility] INVASIVE CV LAB;  Service: Cardiovascular;  Laterality: N/A;  . TEMPORARY PACEMAKER N/A 07/11/2019   Procedure: TEMPORARY PACEMAKER;  Surgeon: Swaziland, Peter M, MD;  Location: [facility] INVASIVE CV  LAB;  Service: Cardiovascular;  Laterality: N/A;  . VAGINAL HYSTERECTOMY  1973   AUB, fibroids     Allergies       Allergies  Allergen Reactions  . Atorvastatin Other (See Comments)    Cramps in legs  . Codeine Nausea And Vomiting  . Erythromycin Base Nausea Only  . Penicillin G Hives    Did it involve swelling of the face/tongue/throat, SOB, or low BP? No Did it involve sudden or severe rash/hives, skin peeling, or any reaction on the inside of your mouth or nose? Yes Did you need to seek medical attention at a hospital or doctor's office? Yes When did it last happen?2010 If all above answers are "NO", may proceed with cephalosporin use.   . Rosuvastatin Other (See Comments)    Muscle pain    History of Present Illness    74 y/o ? w/ a h/o CAD s/p inferior STEMI complicated by VF arrest (during cath) and cardiogenic shock in 07/2019 requiring DES x 2.  Other h/o includes HL, GERD, anxiety, depression, and OA.  Echo at the time of her MI showed nl LV fxn w/o rwma.  She was recently seen in cardiology clinic on October 14, at which time she was doing relatively well.  She did report occasional breathlessness when going up stairs.  It was noted that her most recent LDL was still not at goal, at 97 and she will follow up in lipid clinic for initiation  of PCSK9 inhibitor (previous intolerant to Lipitor and Crestor-currently on Vytorin).  Unfortunately, beginning on the evening of October 20, Sarah Pacheco began to experience intermittent substernal chest discomfort that was most notable while lying down and also with activity.  When occurring at rest, symptoms did seem to improve with sitting up or sublingual nitroglycerin.  Exertional symptoms eased with rest.  Due to intermittent symptoms, she presented to the emergency department October 23 where troponins were normal x2.  ECG showed subtle inferolateral ST depression.  She was pain-free in the emergency department and was  subsequently discharged home with plan for outpatient cardiology follow-up.  Unfortunately, over the past 5 days, she has continued to have intermittent rest and exertional substernal chest heaviness, now associated with occasional diaphoresis as well as radiation to the shoulders and down bilateral arms to the fingers.  Symptoms are very reminiscent of angina prior to MI.  Last night, she had a very restless night with intermittent substernal chest heaviness prompting her to sit up and or take nitroglycerin.  She says she slept very poorly throughout the night.  Because of persistent symptoms overnight, she contacted our office today and was advised to present for direct admission.  She is currently chest pain-free but did experience chest discomfort when walking through the parking lot of the hospital.  Home Medications           Prior to Admission medications   Medication Sig Start Date End Date Taking? Authorizing Provider  acetaminophen (TYLENOL) 500 MG tablet Take 1,000 mg by mouth every 6 (six) hours as needed for mild pain or headache.     [provider]  aspirin EC 81 MG EC tablet Take 1 tablet (81 mg total) by mouth daily. 07/14/19   Dunn, Tacey Ruiz, PA-C  Cyanocobalamin (VITAMIN B-12) 5000 MCG TBDP Take 5,000 mcg by mouth daily.     [provider]  ezetimibe (ZETIA) 10 MG tablet Take 1 tablet (10 mg total) by mouth daily. Patient taking differently: Take 10 mg by mouth at bedtime.  11/14/19   Hilty, Lisette Abu, MD  metoprolol tartrate (LOPRESSOR) 25 MG tablet Take 1 tablet (25 mg total) by mouth daily. 04/04/20   Swaziland, Peter M, MD  nitroGLYCERIN (NITROSTAT) 0.4 MG SL tablet Place 1 tablet (0.4 mg total) under the tongue every 5 (five) minutes as needed for chest pain (up to 3 doses. If taking 3rd dose call 911). 07/13/19   Dunn, Tacey Ruiz, PA-C  Omega-3 Fatty Acids (EQL OMEGA 3 FISH OIL) 1200 MG CAPS Take 1,200 mg by mouth daily.    [provider]    pantoprazole (PROTONIX) 40 MG tablet Take 1 tablet (40 mg total) by mouth daily. 08/16/18   Lynann Bologna, MD  simvastatin (ZOCOR) 40 MG tablet Take 1 tablet (40 mg total) by mouth daily. Patient taking differently: Take 40 mg by mouth at bedtime.  11/14/19   Hilty, Lisette Abu, MD  ticagrelor (BRILINTA) 90 MG TABS tablet Take 1 tablet (90 mg total) by mouth 2 (two) times daily. 04/04/20   Swaziland, Peter M, MD    Family History         Family History  Problem Relation Age of Onset  . Heart attack Father 59       MI  . Heart disease Father   . Heart disease Sister   . Heart attack Brother 59       MI  . Heart attack Sister   . Heart disease  Sister   . Colon cancer Sister 6  . Colon polyps Sister   . Heart disease Brother   . Heart failure Brother   . Colon cancer Brother 23  . Colon polyps Brother   . COPD Brother   . Lung cancer Brother   . Esophageal cancer Neg Hx   . Rectal cancer Neg Hx   . Stomach cancer Neg Hx    She indicated that her mother is deceased. She indicated that her father is deceased. She indicated that two of her three sisters are alive. She indicated that only one of her four brothers is alive. She indicated that the status of her neg hx is unknown.   Social History    Social History        Socioeconomic History  . Marital status: Divorced    Spouse name: Not on file  . Number of children: Not on file  . Years of education: Not on file  . Highest education level: Not on file  Occupational History  . Not on file  Tobacco Use  . Smoking status: Former Smoker    Packs/day: 0.50    Years: 15.00    Pack years: 7.50    Types: Cigarettes  . Smokeless tobacco: Never Used  . Tobacco comment: Quit around age 9  Vaping Use  . Vaping Use: Never used  Substance and Sexual Activity  . Alcohol use: No  . Drug use: No  . Sexual activity: Not on file  Other Topics Concern  . Not on file  Social History Narrative    Lives locally with husband.   Social Determinants of Health      Financial Resource Strain:   . Difficulty of Paying Living Expenses: Not on file  Food Insecurity:   . Worried About Programme researcher, broadcasting/film/video in the Last Year: Not on file  . Ran Out of Food in the Last Year: Not on file  Transportation Needs:   . Lack of Transportation (Medical): Not on file  . Lack of Transportation (Non-Medical): Not on file  Physical Activity:   . Days of Exercise per Week: Not on file  . Minutes of Exercise per Session: Not on file  Stress:   . Feeling of Stress : Not on file  Social Connections:   . Frequency of Communication with Friends and Family: Not on file  . Frequency of Social Gatherings with Friends and Family: Not on file  . Attends Religious Services: Not on file  . Active Member of Clubs or Organizations: Not on file  . Attends Banker Meetings: Not on file  . Marital Status: Not on file  Intimate Partner Violence:   . Fear of Current or Ex-Partner: Not on file  . Emotionally Abused: Not on file  . Physically Abused: Not on file  . Sexually Abused: Not on file     Review of Systems    General:  No chills, fever, night sweats or weight changes.  Cardiovascular:  +++ chest pain, +++ dyspnea on exertion, no edema, orthopnea, palpitations, paroxysmal nocturnal dyspnea. Dermatological: No rash, lesions/masses Respiratory: No cough, +++ dyspnea Urologic: No hematuria, dysuria Abdominal:   No nausea, vomiting, diarrhea, bright red blood per rectum, melena, or hematemesis Neurologic:  No visual changes, wkns, changes in mental status. All other systems reviewed and are otherwise negative except as noted above.  Physical Exam    Blood pressure (!) 154/64, pulse 60, temperature 98.2 F (36.8 C), temperature source Oral, SpO2  96 %.  General: Pleasant, NAD Psych: Normal affect. Neuro: Alert and oriented X 3. Moves all extremities spontaneously. HEENT: Normal   Neck:  Supple without bruits or JVD. Lungs:  Resp regular and unlabored, CTA. Heart: RRR no s3, s4, or murmurs. Abdomen: Soft, non-tender, non-distended, BS + x 4.  Extremities: No clubbing, cyanosis or edema. DP/PT/Radials 2+ and equal bilaterally.  Labs    Cardiac Enzymes Last Labs       Recent Labs  Lab 06/01/20 1415 06/01/20 1615  TROPONINIHS 4 6        Recent Labs       Lab Results  Component Value Date   WBC 7.5 06/01/2020   HGB 10.4 (L) 06/01/2020   HCT 34.1 (L) 06/01/2020   MCV 87.9 06/01/2020   PLT 288 06/01/2020      Last Labs      Recent Labs  Lab 06/01/20 1415  NA 141  K 3.8  CL 109  CO2 24  BUN 20  CREATININE 1.05*  CALCIUM 9.1  GLUCOSE 90     Recent Labs       Lab Results  Component Value Date   CHOL 175 05/20/2020   HDL 55 05/20/2020   LDLCALC 97 05/20/2020   TRIG 130 05/20/2020       Radiology Studies     Imaging Results  DG Chest 2 View  Result Date: 06/01/2020 CLINICAL DATA:  Chest pain. EXAM: CHEST - 2 VIEW COMPARISON:  None. FINDINGS: The heart size and mediastinal contours are within normal limits. There is no evidence of pulmonary edema, consolidation, pneumothorax, nodule or pleural fluid. The visualized skeletal structures are unremarkable. IMPRESSION: No active cardiopulmonary disease. Electronically Signed   By: Irish Lack M.D.   On: 06/01/2020 14:49     ECG & Cardiac Imaging    Pending today.  ECG from ED dated 10/23 reviewed - Sinus brady, 57, subtle inferolateral ST depression  - personally reviewed.  Assessment & Plan    1.  Unstable angina: Patient presents with a 6-day history of intermittent rest and exertional substernal chest pressure with associated diaphoresis and radiation down the arms into her fingers, similar to prior angina preceding her inferior MI in December 2020.  She was evaluated in the emergency department on October 23 where ECG showed subtle inferolateral ST depression however,  troponins were normal and she was subsequently discharged home.  Since her ED visit, she has had more progressive exertional symptoms as well as significant discomfort throughout the night last night, prompting presentation today.  She is currently chest pain-free but did have exertional discomfort when walking across the parking lot today.  We will continue her aspirin, beta-blocker, Brilinta, and statin/Zetia therapy.  I will add heparin in the setting of intermittent resting symptoms, intravenous nitroglycerin as well.  We will have to watch pressure closely.  The patient understands that risks include but are not limited to stroke (1 in 1000), death (1 in 1000), kidney failure [usually temporary] (1 in 500), bleeding (1 in 200), allergic reaction [possibly serious] (1 in 200), and agrees to proceed.  She is scheduled for diagnostic catheterization in the a.m.  2.  Hyperlipidemia: Recent LDL of 97 despite Vytorin therapy.  She is intolerant to rosuvastatin and atorvastatin.  Discussed with patient, will pursue PCSK9 inhibitor initiation as an outpatient.  3.  GERD: Continue PPI therapy.  Signed, Nicolasa Ducking, NP 06/03/2020, 6:43 PM  Attestation signed by Swaziland, Peter M, MD at 06/03/2020 8:20 PM  Patient seen and examined and history reviewed. Agree with above findings and plan.  74 yo WF well known to me. S/p inferior STEMI in Dec 2020 complicated by V fib arrest and cardiogenic shock. She underwent emergent stenting of the mid RCA with overlapping DES x 2. She has done well since then until this past Wednesday. Notes increasing frequency and severity of chest pain. Some episodes at rest. Worse with any exertion. Relieved with sl Ntg. Seen in ED on Saturday and troponins negative. Ecg showed slight ST depression inferolaterally. She has continued to have chest pain since then similar to prior MI pain. Directed admitted for further evaluation.  Her exam is benign.  Troponin is  negative.  Ecg shows NSR with persistent ST depression in the inferolateral leads and now with poor R wave progression and widening QRS duration.  Will admit to telemetry. Check Covid test. Treat with IV Heparin and Ntg. Plan invasive evaluation in am with cardiac cath. The procedure and risks were reviewed including but not limited to death, myocardial infarction, stroke, arrythmias, bleeding, transfusion, emergency surgery, dye allergy, or renal dysfunction. The patient voices understanding and is agreeable to proceed.Marland Kitchen.  Has been followed in lipid clinic with Dr Rennis GoldenHilty. On maximum tolerated statin and Zetia. LDL has been increasing. Recently 95. Will need to initiate PCSK 9 inhibitor at DC.  Peter SwazilandJordan, MDFACC 06/03/2020 8:12 PM

## 2020-06-03 NOTE — Progress Notes (Deleted)
History & Physical    Patient ID: Sarah Pacheco MRN: [id number], DOB/AGE: 1945/12/27   Admit date: 06/03/2020   Primary Physician: [facility] Primary Cardiologist: Sarah [place], Pacheco  Patient Profile    74 y/o ? w/ a h/o CAD s/p prior Inf STEMI complicated by cardiogenic shock HL, depression, anxiety, GERD, and arthritis, who presents from home for direct admission secondary to intermittent chest pain/unstable angina since 10/20.  Past Medical History    Past Medical History:  Diagnosis Date  . Anxiety   . Arthritis   . CAD (coronary artery disease)    a. 07/2019 Inf STEMI/VF Arrest/CGS/PCI: LM nl, LADmin irregs, LCX nl, RCA 100p/Pacheco (2.75x38 & 2.75x26 Resolute Onyx DESs).  . Cataract   . Chest pain   . Depression   . GERD (gastroesophageal reflux disease)   . History of echocardiogram    a. 07/2019 Echo: EF 60-65%, no rwma, nl RV size/fxn, mildly dil LA, mild MR.  Marland Kitchen Hyperlipidemia   . Hyperlipidemia with target LDL less than 70 10/25/2015    Past Surgical History:  Procedure Laterality Date  . APPENDECTOMY    . CATARACT EXTRACTION, BILATERAL    . COLONOSCOPY  2017   X2     colon polyps  . CORONARY/GRAFT ACUTE MI REVASCULARIZATION N/A 07/11/2019   Procedure: CORONARY/GRAFT ACUTE MI REVASCULARIZATION;  Surgeon: Sarah Pacheco;  Location: [facility] INVASIVE CV LAB;  Service: Cardiovascular;  Laterality: N/A;  . LAPAROTOMY N/A 10/10/2018   Procedure: EXPLORATORY LAPAROTOMY RIGHT COLECTOMY;  Surgeon: Sarah Loron, Pacheco;  Location: [facility] ORS;  Service: General;  Laterality: N/A;  . LEFT HEART CATH AND CORONARY ANGIOGRAPHY N/A 07/11/2019   Procedure: LEFT HEART CATH AND CORONARY ANGIOGRAPHY;  Surgeon: Sarah Pacheco;  Location: [facility] INVASIVE CV LAB;  Service: Cardiovascular;  Laterality: N/A;  . TEMPORARY PACEMAKER N/A 07/11/2019   Procedure: TEMPORARY PACEMAKER;  Surgeon: Sarah Pacheco;  Location: [facility] INVASIVE CV LAB;  Service: Cardiovascular;   Laterality: N/A;  . VAGINAL HYSTERECTOMY  1973   AUB, fibroids     Allergies  Allergies  Allergen Reactions  . Atorvastatin Other (See Comments)    Cramps in legs  . Codeine Nausea And Vomiting  . Erythromycin Base Nausea Only  . Penicillin G Hives    Did it involve swelling of the face/tongue/throat, SOB, or low BP? No Did it involve sudden or severe rash/hives, skin peeling, or any reaction on the inside of your mouth or nose? Yes Did you need to seek medical attention at a hospital or doctor's office? Yes When did it last happen?2010 If all above answers are "NO", may proceed with cephalosporin use.   . Rosuvastatin Other (See Comments)    Muscle pain    History of Present Illness    74 y/o ? w/ a h/o CAD s/p inferior STEMI complicated by VF arrest (during cath) and cardiogenic shock in 07/2019 requiring DES x 2.  Other h/o includes HL, GERD, anxiety, depression, and OA.  Echo at the time of her MI showed nl LV fxn w/o rwma.  She was recently seen in cardiology clinic on October 14, at which time she was doing relatively well.  She did report occasional breathlessness when going up stairs.  It was noted that her most recent LDL was still not at goal, at 97 and she will follow up in lipid clinic for initiation of PCSK9 inhibitor (previous intolerant to Lipitor and Crestor-currently on Vytorin).  Unfortunately, beginning  on the evening of October 20, [name] began to experience intermittent substernal chest discomfort that was most notable while lying down and also with activity.  When occurring at rest, symptoms did seem to improve with sitting up or sublingual nitroglycerin.  Exertional symptoms eased with rest.  Due to intermittent symptoms, she presented to the emergency department October 23 where troponins were normal x2.  ECG showed subtle inferolateral ST depression.  She was pain-free in the emergency department and was subsequently discharged home with plan for outpatient  cardiology follow-up.  Unfortunately, over the past 5 days, she has continued to have intermittent rest and exertional substernal chest heaviness, now associated with occasional diaphoresis as well as radiation to the shoulders and down bilateral arms to the fingers.  Symptoms are very reminiscent of angina prior to MI.  Last night, she had a very restless night with intermittent substernal chest heaviness prompting her to sit up and or take nitroglycerin.  She says she slept very poorly throughout the night.  Because of persistent symptoms overnight, she contacted our office today and was advised to present for direct admission.  She is currently chest pain-free but did experience chest discomfort when walking through the parking lot of the hospital.  Home Medications    Prior to Admission medications   Medication Sig Start Date End Date Taking? Authorizing Provider  acetaminophen (TYLENOL) 500 MG tablet Take 1,000 mg by mouth every 6 (six) hours as needed for mild pain or headache.     Provider, Historical, Pacheco  aspirin EC 81 MG EC tablet Take 1 tablet (81 mg total) by mouth daily. 07/14/19   Sarah Pacheco  Cyanocobalamin (VITAMIN B-12) 5000 MCG TBDP Take 5,000 mcg by mouth daily.     Provider, Historical, Pacheco  ezetimibe (ZETIA) 10 MG tablet Take 1 tablet (10 mg total) by mouth daily. Patient taking differently: Take 10 mg by mouth at bedtime.  11/14/19   Sarah Pacheco  metoprolol tartrate (LOPRESSOR) 25 MG tablet Take 1 tablet (25 mg total) by mouth daily. 04/04/20   [place], Sarah Pacheco, Pacheco  nitroGLYCERIN (NITROSTAT) 0.4 MG SL tablet Place 1 tablet (0.4 mg total) under the tongue every 5 (five) minutes as needed for chest pain (up to 3 doses. If taking 3rd dose call 911). 07/13/19   Sarah Pacheco  Omega-3 Fatty Acids (EQL OMEGA 3 FISH OIL) 1200 MG CAPS Take 1,200 mg by mouth daily.    Provider, Historical, Pacheco  pantoprazole (PROTONIX) 40 MG tablet Take 1 tablet (40 mg total) by mouth daily.  08/16/18   Sarah Pacheco  simvastatin (ZOCOR) 40 MG tablet Take 1 tablet (40 mg total) by mouth daily. Patient taking differently: Take 40 mg by mouth at bedtime.  11/14/19   Sarah Pacheco  ticagrelor (BRILINTA) 90 MG TABS tablet Take 1 tablet (90 mg total) by mouth 2 (two) times daily. 04/04/20   [place], Sarah Pacheco, Pacheco    Family History    Family History  Problem Relation Age of Onset  . Heart attack Father 472       MI  . Heart disease Father   . Heart disease Sister   . Heart attack Brother 59       MI  . Heart attack Sister   . Heart disease Sister   . Colon cancer Sister [id number]  . Colon polyps Sister   . Heart disease Brother   . Heart failure Brother   .  Colon cancer Brother 20  . Colon polyps Brother   . COPD Brother   . Lung cancer Brother   . Esophageal cancer Neg Hx   . Rectal cancer Neg Hx   . Stomach cancer Neg Hx    She indicated that her mother is deceased. She indicated that her father is deceased. She indicated that two of her three sisters are alive. She indicated that only one of her four brothers is alive. She indicated that the status of her neg hx is unknown.   Social History    Social History   Socioeconomic History  . Marital status: Divorced    Spouse name: Not on file  . Number of children: Not on file  . Years of education: Not on file  . Highest education level: Not on file  Occupational History  . Not on file  Tobacco Use  . Smoking status: Former Smoker    Packs/day: 0.50    Years: 15.00    Pack years: 7.50    Types: Cigarettes  . Smokeless tobacco: Never Used  . Tobacco comment: Quit around age 80  Vaping Use  . Vaping Use: Never used  Substance and Sexual Activity  . Alcohol use: No  . Drug use: No  . Sexual activity: Not on file  Other Topics Concern  . Not on file  Social History Narrative   Lives locally with husband.   Social Determinants of Health   Financial Resource Strain:   . Difficulty of Paying Living Expenses:  Not on file  Food Insecurity:   . Worried About Programme researcher, broadcasting/film/video in the Last Year: Not on file  . Ran Out of Food in the Last Year: Not on file  Transportation Needs:   . Lack of Transportation (Medical): Not on file  . Lack of Transportation (Non-Medical): Not on file  Physical Activity:   . Days of Exercise per Week: Not on file  . Minutes of Exercise per Session: Not on file  Stress:   . Feeling of Stress : Not on file  Social Connections:   . Frequency of Communication with Friends and Family: Not on file  . Frequency of Social Gatherings with Friends and Family: Not on file  . Attends Religious Services: Not on file  . Active Member of Clubs or Organizations: Not on file  . Attends Banker Meetings: Not on file  . Marital Status: Not on file  Intimate Partner Violence:   . Fear of Current or Ex-Partner: Not on file  . Emotionally Abused: Not on file  . Physically Abused: Not on file  . Sexually Abused: Not on file     Review of Systems    General:  No chills, fever, night sweats or weight changes.  Cardiovascular:  +++ chest pain, +++ dyspnea on exertion, no edema, orthopnea, palpitations, paroxysmal nocturnal dyspnea. Dermatological: No rash, lesions/masses Respiratory: No cough, +++ dyspnea Urologic: No hematuria, dysuria Abdominal:   No nausea, vomiting, diarrhea, bright red blood per rectum, melena, or hematemesis Neurologic:  No visual changes, wkns, changes in mental status. All other systems reviewed and are otherwise negative except as noted above.  Physical Exam    Blood pressure (!) 154/64, pulse 60, temperature 98.2 F (36.8 C), temperature source Oral, SpO2 96 %.  General: Pleasant, NAD Psych: Normal affect. Neuro: Alert and oriented X 3. Moves all extremities spontaneously. HEENT: Normal  Neck: Supple without bruits or JVD. Lungs:  Resp regular and unlabored, CTA. Heart:  RRR no s3, s4, or murmurs. Abdomen: Soft, non-tender,  non-distended, BS + x 4.  Extremities: No clubbing, cyanosis or edema. DP/PT/Radials 2+ and equal bilaterally.  Labs    Cardiac Enzymes Recent Labs  Lab 06/01/20 1415 06/01/20 1615  TROPONINIHS 4 6      Lab Results  Component Value Date   WBC 7.5 06/01/2020   HGB 10.4 (L) 06/01/2020   HCT 34.1 (L) 06/01/2020   MCV 87.9 06/01/2020   PLT 288 06/01/2020    Recent Labs  Lab 06/01/20 1415  NA 141  K 3.8  CL 109  CO2 24  BUN 20  CREATININE 1.05*  CALCIUM 9.1  GLUCOSE 90   Lab Results  Component Value Date   CHOL 175 05/20/2020   HDL 55 05/20/2020   LDLCALC 97 05/20/2020   TRIG 130 05/20/2020     Radiology Studies    DG Chest 2 View  Result Date: 06/01/2020 CLINICAL DATA:  Chest pain. EXAM: CHEST - 2 VIEW COMPARISON:  None. FINDINGS: The heart size and mediastinal contours are within normal limits. There is no evidence of pulmonary edema, consolidation, pneumothorax, nodule or pleural fluid. The visualized skeletal structures are unremarkable. IMPRESSION: No active cardiopulmonary disease. Electronically Signed   By: Irish Lack Pacheco.D.   On: 06/01/2020 14:49    ECG & Cardiac Imaging    Pending today.  ECG from ED dated 10/23 reviewed - Sinus brady, 57, subtle inferolateral ST depression  - personally reviewed.  Assessment & Plan    1.  Unstable angina: Patient presents with a 6-day history of intermittent rest and exertional substernal chest pressure with associated diaphoresis and radiation down the arms into her fingers, similar to prior angina preceding her inferior MI in December 2020.  She was evaluated in the emergency department on October 23 where ECG showed subtle inferolateral ST depression however, troponins were normal and she was subsequently discharged home.  Since her ED visit, she has had more progressive exertional symptoms as well as significant discomfort throughout the night last night, prompting presentation today.  She is currently chest  pain-free but did have exertional discomfort when walking across the parking lot today.  We will continue her aspirin, beta-blocker, Brilinta, and statin/Zetia therapy.  I will add heparin in the setting of intermittent resting symptoms, intravenous nitroglycerin as well.  We will have to watch pressure closely.  The patient understands that risks include but are not limited to stroke (1 in 1000), death (1 in 1000), kidney failure [usually temporary] (1 in 500), bleeding (1 in 200), allergic reaction [possibly serious] (1 in 200), and agrees to proceed.  She is scheduled for diagnostic catheterization in the a.Pacheco.  2.  Hyperlipidemia: Recent LDL of 97 despite Vytorin therapy.  She is intolerant to rosuvastatin and atorvastatin.  Discussed with patient, will pursue PCSK9 inhibitor initiation as an outpatient.  3.  GERD: Continue PPI therapy.  Signed, Nicolasa Ducking, NP 06/03/2020, 6:43 PM

## 2020-06-03 NOTE — Telephone Encounter (Signed)
I spoke to Sarah Pacheco. She is having unstable angina symptoms. We are going to directly admit her to [facility] today for cath tomorrow. We have already talked with bed control.  Travone Georg [place] MD, [facility]

## 2020-06-03 NOTE — Progress Notes (Signed)
ANTICOAGULATION CONSULT NOTE - Initial Consult  Pharmacy Consult for IV Heparin Indication: chest pain/ACS  Allergies  Allergen Reactions  . Atorvastatin Other (See Comments)    Cramps in legs  . Codeine Nausea And Vomiting  . Erythromycin Base Nausea Only  . Penicillin G Hives    Did it involve swelling of the face/tongue/throat, SOB, or low BP? No Did it involve sudden or severe rash/hives, skin peeling, or any reaction on the inside of your mouth or nose? Yes Did you need to seek medical attention at a hospital or doctor's office? Yes When did it last happen?2010 If all above answers are "NO", may proceed with cephalosporin use.   . Rosuvastatin Other (See Comments)    Muscle pain    Patient Measurements: Total Body Weight: 63.5 kg Height: 63 inches Heparin Dosing Weight: 63.5 kg  Vital Signs: Temp: 98.2 F (36.8 C) (10/25 1820) Temp Source: Oral (10/25 1820) BP: 154/64 (10/25 1820) Pulse Rate: 60 (10/25 1820)  Labs: Recent Labs    06/01/20 1415 06/01/20 1615  HGB 10.4*  --   HCT 34.1*  --   PLT 288  --   CREATININE 1.05*  --   TROPONINIHS 4 6    Estimated Creatinine Clearance: 43.4 mL/min (A) (by C-G formula based on SCr of 1.05 mg/dL (H)).   Medical History: Past Medical History:  Diagnosis Date  . Anxiety   . Arthritis   . CAD (coronary artery disease)    a. 07/2019 Inf STEMI/VF Arrest/CGS/PCI: LM nl, LADmin irregs, LCX nl, RCA 100p/m (2.75x38 & 2.75x26 Resolute Onyx DESs).  . Cataract   . Chest pain   . Depression   . GERD (gastroesophageal reflux disease)   . History of echocardiogram    a. 07/2019 Echo: EF 60-65%, no rwma, nl RV size/fxn, mildly dil LA, mild MR.  Marland Kitchen Hyperlipidemia   . Hyperlipidemia with target LDL less than 70 10/25/2015    Assessment: 74 yr old woman, direct admit by Dr. Swaziland for unstable angina symptoms; cath planned for tomorrow, 10/26. Pharmacy is consulted to dose IV heparin; per med rec, pt on no anticoagulants PTA  (on ASA, Brilinta).  H/H 10.4/34.1, platelets 288  Goal of Therapy:  Heparin level 0.3-0.7 units/ml Monitor platelets by anticoagulation protocol: Yes   Plan:  Heparin 3800 units IV bolus X 1, followed by heparin infusion at 750 units/hr Check heparin level in ~7 hrs Monitor daily heparin level, CBC Monitor for signs/symptoms of bleeding F/U after cath on 10/26  Vicki Mallet, PharmD, BCPS, [facility] Clinical Pharmacist 06/03/2020,6:33 PM

## 2020-06-04 ENCOUNTER — Other Ambulatory Visit: Payer: Self-pay

## 2020-06-04 ENCOUNTER — Ambulatory Visit (HOSPITAL_COMMUNITY)
Admission: RE | Admit: 2020-06-04 | Payer: Medicare Other | Source: Home / Self Care | Admitting: Interventional Cardiology

## 2020-06-04 ENCOUNTER — Encounter (HOSPITAL_COMMUNITY): Payer: Self-pay | Admitting: Cardiology

## 2020-06-04 ENCOUNTER — Inpatient Hospital Stay (HOSPITAL_COMMUNITY)
Admission: AD | Disposition: A | Discharge status: Home / Self Care | Payer: Self-pay | Source: Ambulatory Visit | Attending: Cardiology

## 2020-06-04 DIAGNOSIS — I2510 Atherosclerotic heart disease of native coronary artery without angina pectoris: Secondary | ICD-10-CM

## 2020-06-04 DIAGNOSIS — E7800 Pure hypercholesterolemia, unspecified: Secondary | ICD-10-CM | POA: Diagnosis not present

## 2020-06-04 DIAGNOSIS — I214 Non-ST elevation (NSTEMI) myocardial infarction: Principal | ICD-10-CM

## 2020-06-04 HISTORY — PX: LEFT HEART CATH AND CORONARY ANGIOGRAPHY: CATH118249

## 2020-06-04 HISTORY — PX: INTRAVASCULAR ULTRASOUND/IVUS: CATH118244

## 2020-06-04 HISTORY — PX: CORONARY BALLOON ANGIOPLASTY: CATH118233

## 2020-06-04 LAB — HEPARIN LEVEL (UNFRACTIONATED)
Heparin Unfractionated: 0.56 IU/mL (ref 0.30–0.70)
Heparin Unfractionated: 0.54 IU/mL (ref 0.30–0.70)

## 2020-06-04 LAB — TROPONIN I (HIGH SENSITIVITY): Troponin I (High Sensitivity): 147 ng/L (ref <18)

## 2020-06-04 LAB — POCT ACTIVATED CLOTTING TIME
Activated Clotting Time: 263 seconds
Activated Clotting Time: 351 seconds
Activated Clotting Time: 224 seconds
Activated Clotting Time: 202 seconds
Activated Clotting Time: 175 seconds

## 2020-06-04 LAB — CBC
HCT: 31.6 % — ABNORMAL LOW (ref 36.0–46.0)
Hemoglobin: 9.8 g/dL — ABNORMAL LOW (ref 12.0–15.0)
MCH: 26.3 pg (ref 26.0–34.0)
MCHC: 31 g/dL (ref 30.0–36.0)
MCV: 84.9 fL (ref 80.0–100.0)
Platelets: 276 10*3/uL (ref 150–400)
RBC: 3.72 MIL/uL — ABNORMAL LOW (ref 3.87–5.11)
RDW: 14.6 % (ref 11.5–15.5)
WBC: 7 10*3/uL (ref 4.0–10.5)
nRBC: 0 % (ref 0.0–0.2)

## 2020-06-04 SURGERY — LEFT HEART CATH AND CORONARY ANGIOGRAPHY
Anesthesia: LOCAL

## 2020-06-04 MED ORDER — FENTANYL CITRATE (PF) 100 MCG/2ML IJ SOLN
INTRAMUSCULAR | Status: DC | PRN
  Administered 2020-06-04: 25 ug via INTRAVENOUS
  Administered 2020-06-04: 50 ug via INTRAVENOUS
  Administered 2020-06-04: 25 ug via INTRAVENOUS

## 2020-06-04 MED ORDER — SODIUM CHLORIDE 0.9% FLUSH: 3.0000 mL | Freq: Two times a day (BID) | INTRAVENOUS | Status: DC

## 2020-06-04 MED ORDER — IOHEXOL 350 MG/ML SOLN
INTRAVENOUS | Status: DC | PRN
  Administered 2020-06-04: 60 mL

## 2020-06-04 MED ORDER — ACETAMINOPHEN 325 MG PO TABS
650.0000 mg | ORAL_TABLET | ORAL | Status: DC | PRN
  Administered 2020-06-04 – 2020-06-05 (×2): 650 mg via ORAL
  Filled 2020-06-04 (×2): qty 2

## 2020-06-04 MED ORDER — SODIUM CHLORIDE 0.9 % IV SOLN: 250.0000 mL | INTRAVENOUS | Status: DC | PRN

## 2020-06-04 MED ORDER — LIDOCAINE HCL (PF) 1 % IJ SOLN
INTRAMUSCULAR | Status: DC | PRN
  Administered 2020-06-04: 15 mL
  Administered 2020-06-04: 2 mL

## 2020-06-04 MED ORDER — HEPARIN SODIUM (PORCINE) 1000 UNIT/ML IJ SOLN
INTRAMUSCULAR | Status: AC
  Filled 2020-06-04: qty 1

## 2020-06-04 MED ORDER — SODIUM CHLORIDE 0.9% FLUSH: 3.0000 mL | INTRAVENOUS | Status: DC | PRN

## 2020-06-04 MED ORDER — HYDROMORPHONE HCL 1 MG/ML IJ SOLN
INTRAMUSCULAR | Status: DC | PRN
Start: 2020-06-04 — End: 2020-06-04
  Administered 2020-06-04: 0.5 mg via INTRAVENOUS

## 2020-06-04 MED ORDER — LIDOCAINE HCL (PF) 1 % IJ SOLN
INTRAMUSCULAR | Status: AC
  Filled 2020-06-04: qty 30

## 2020-06-04 MED ORDER — HYDRALAZINE HCL 20 MG/ML IJ SOLN
INTRAMUSCULAR | Status: AC
  Filled 2020-06-04: qty 1

## 2020-06-04 MED ORDER — MIDAZOLAM HCL 2 MG/2ML IJ SOLN
INTRAMUSCULAR | Status: AC
  Filled 2020-06-04: qty 2

## 2020-06-04 MED ORDER — METOPROLOL TARTRATE 5 MG/5ML IV SOLN
INTRAVENOUS | Status: AC
  Filled 2020-06-04: qty 5

## 2020-06-04 MED ORDER — HYDROMORPHONE HCL 1 MG/ML IJ SOLN
INTRAMUSCULAR | Status: AC
  Filled 2020-06-04: qty 0.5

## 2020-06-04 MED ORDER — LABETALOL HCL 5 MG/ML IV SOLN: 10.0000 mg | INTRAVENOUS | Status: AC | PRN

## 2020-06-04 MED ORDER — ONDANSETRON HCL 4 MG/2ML IJ SOLN
INTRAMUSCULAR | Status: AC
  Filled 2020-06-04: qty 2

## 2020-06-04 MED ORDER — HEPARIN (PORCINE) IN NACL 1000-0.9 UT/500ML-% IV SOLN
INTRAVENOUS | Status: AC
  Filled 2020-06-04: qty 1000

## 2020-06-04 MED ORDER — HYDRALAZINE HCL 20 MG/ML IJ SOLN
10.0000 mg | INTRAMUSCULAR | Status: AC | PRN
  Administered 2020-06-04: 10 mg via INTRAVENOUS

## 2020-06-04 MED ORDER — VERAPAMIL HCL 2.5 MG/ML IV SOLN
INTRAVENOUS | Status: AC
  Filled 2020-06-04: qty 2

## 2020-06-04 MED ORDER — ACETAMINOPHEN 325 MG PO TABS
ORAL_TABLET | ORAL | Status: AC
  Filled 2020-06-04: qty 2

## 2020-06-04 MED ORDER — ONDANSETRON HCL 4 MG/2ML IJ SOLN: 4.0000 mg | Freq: Four times a day (QID) | INTRAMUSCULAR | Status: DC | PRN

## 2020-06-04 MED ORDER — METOPROLOL TARTRATE 5 MG/5ML IV SOLN
INTRAVENOUS | Status: DC | PRN
  Administered 2020-06-04: 5 mg via INTRAVENOUS

## 2020-06-04 MED ORDER — FENTANYL CITRATE (PF) 100 MCG/2ML IJ SOLN
INTRAMUSCULAR | Status: AC
Start: 2020-06-04 — End: ?
  Filled 2020-06-04: qty 2

## 2020-06-04 MED ORDER — HEPARIN SODIUM (PORCINE) 1000 UNIT/ML IJ SOLN
INTRAMUSCULAR | Status: DC | PRN
  Administered 2020-06-04: 3000 [IU] via INTRAVENOUS
  Administered 2020-06-04: 8000 [IU] via INTRAVENOUS

## 2020-06-04 MED ORDER — HEPARIN (PORCINE) IN NACL 1000-0.9 UT/500ML-% IV SOLN
INTRAVENOUS | Status: DC | PRN
  Administered 2020-06-04 (×2): 500 mL

## 2020-06-04 MED ORDER — SODIUM CHLORIDE 0.9 % IV SOLN
INTRAVENOUS | Status: AC
  Administered 2020-06-04: 100 mL/h via INTRAVENOUS

## 2020-06-04 MED ORDER — MIDAZOLAM HCL 2 MG/2ML IJ SOLN
INTRAMUSCULAR | Status: DC | PRN
  Administered 2020-06-04: 2 mg via INTRAVENOUS
  Administered 2020-06-04 (×2): 1 mg via INTRAVENOUS

## 2020-06-04 SURGICAL SUPPLY — 18 items
BALLN SAPPHIRE ~~LOC~~ 3.5X15 (BALLOONS) ×1 IMPLANT
BALLN WOLVERINE 3.00X10 (BALLOONS) ×2
BALLOON WOLVERINE 3.00X10 (BALLOONS) IMPLANT
CATH INFINITI 5FR MULTPACK ANG (CATHETERS) ×1 IMPLANT
CATH LAUNCHER 6FR JR4 (CATHETERS) ×1 IMPLANT
CATH OPTICROSS HD (CATHETERS) ×1 IMPLANT
GLIDESHEATH SLEND SS 6F .021 (SHEATH) ×1 IMPLANT
KIT ENCORE 26 ADVANTAGE (KITS) ×1 IMPLANT
KIT HEART LEFT (KITS) ×2 IMPLANT
KIT HEMO VALVE WATCHDOG (MISCELLANEOUS) ×1 IMPLANT
PACK CARDIAC CATHETERIZATION (CUSTOM PROCEDURE TRAY) ×2 IMPLANT
SHEATH PINNACLE 5F 10CM (SHEATH) ×1 IMPLANT
SHEATH PINNACLE 6F 10CM (SHEATH) ×1 IMPLANT
SLED PULL BACK IVUS (MISCELLANEOUS) ×1 IMPLANT
TRANSDUCER W/STOPCOCK (MISCELLANEOUS) ×2 IMPLANT
TUBING CIL FLEX 10 FLL-RA (TUBING) ×2 IMPLANT
WIRE .035 3MM-J 145CM (WIRE) ×1 IMPLANT
WIRE ASAHI PROWATER 180CM (WIRE) ×1 IMPLANT

## 2020-06-04 NOTE — Progress Notes (Signed)
Patient transferred from cath lab at 1828hrs. Right femoral site with large gauze dressing, purple bruising noted, area soft to palpation. +2 right pedal pulse. Patient given post cath instructions, verbalized understanding.

## 2020-06-04 NOTE — Progress Notes (Signed)
ANTICOAGULATION CONSULT NOTE   Pharmacy Consult for IV Heparin Indication: chest pain/ACS  Allergies  Allergen Reactions  . Atorvastatin Other (See Comments)    Cramps in legs  . Codeine Nausea And Vomiting  . Erythromycin Base Nausea Only  . Penicillin G Hives    Did it involve swelling of the face/tongue/throat, SOB, or low BP? No Did it involve sudden or severe rash/hives, skin peeling, or any reaction on the inside of your mouth or nose? Yes Did you need to seek medical attention at a hospital or doctor's office? Yes When did it last happen?2010 If all above answers are "NO", may proceed with cephalosporin use.   . Rosuvastatin Other (See Comments)    Muscle pain    Patient Measurements: Total Body Weight: 63.5 kg Height: 63 inches Heparin Dosing Weight: 63.5 kg  Vital Signs: Temp: 98.4 F (36.9 C) (10/25 2015) Temp Source: Oral (10/25 2015) BP: 106/54 (10/25 2300) Pulse Rate: 60 (10/25 1820)  Labs: Recent Labs    06/01/20 1415 06/01/20 1415 06/01/20 1615 06/03/20 1859 06/04/20 0226  HGB 10.4*   < >  --  10.3* 9.8*  HCT 34.1*  --   --  33.3* 31.6*  PLT 288  --   --  282 276  HEPARINUNFRC  --   --   --   --  0.56  CREATININE 1.05*  --   --  1.20*  --   TROPONINIHS 4  --  6 9  --    < > = values in this interval not displayed.    Estimated Creatinine Clearance: 38.1 mL/min (A) (by C-G formula based on SCr of 1.2 mg/dL (H)).   Medical History: Past Medical History:  Diagnosis Date  . Anxiety   . Arthritis   . CAD (coronary artery disease)    a. 07/2019 Inf STEMI/VF Arrest/CGS/PCI: LM nl, LADmin irregs, LCX nl, RCA 100p/m (2.75x38 & 2.75x26 Resolute Onyx DESs).  . Cataract   . Chest pain   . Depression   . GERD (gastroesophageal reflux disease)   . History of echocardiogram    a. 07/2019 Echo: EF 60-65%, no rwma, nl RV size/fxn, mildly dil LA, mild MR.  Marland Kitchen Hyperlipidemia   . Hyperlipidemia with target LDL less than 70 10/25/2015     Assessment: 74 yr old woman, direct admit by Dr. Sw[place] for unstable angina symptoms; cath planned for tomorrow, 10/26. Pharmacy is consulted to dose IV heparin; per med rec, pt on no anticoagulants PTA (on ASA, Brilinta).  H/H 10.4/34.1, platelets 288  10/26 AM update:  Heparin level therapeutic   Goal of Therapy:  Heparin level 0.3-0.7 units/ml Monitor platelets by anticoagulation protocol: Yes   Plan:  Cont heparin 750 units/hr 1000 heparin level  Abran Duke, PharmD, BCPS Clinical Pharmacist Phone: [phone]

## 2020-06-04 NOTE — H&P (View-Only) (Signed)
 Progress Note  Patient Name: Sarah Pacheco Date of Encounter: 06/04/2020  [facility] Cardiologist: Jourdin Connors, MD   Subjective   Feels much better today. States pain resolved once IV Ntg started.   Inpatient Medications    Scheduled Meds: . aspirin EC  81 mg Oral Daily  . ezetimibe  10 mg Oral QHS  . metoprolol tartrate  12.5 mg Oral BID  . pantoprazole  40 mg Oral Daily  . simvastatin  40 mg Oral QHS  . sodium chloride flush  3 mL Intravenous Q12H  . ticagrelor  90 mg Oral BID   Continuous Infusions: . sodium chloride    . sodium chloride 1 mL/kg/hr (06/04/20 0637)  . heparin 750 Units/hr (06/04/20 0130)  . nitroGLYCERIN 15 mcg/min (06/04/20 0130)   PRN Meds: sodium chloride, acetaminophen, nitroGLYCERIN, ondansetron (ZOFRAN) IV, sodium chloride flush   Vital Signs    Vitals:   06/03/20 2300 06/04/20 0130 06/04/20 0500 06/04/20 0700  BP: (!) 106/54  112/74 123/62  Pulse:      Resp:    17  Temp:   98.5 F (36.9 C) 98.4 F (36.9 C)  TempSrc:   Oral Oral  SpO2: 93%  96% 93%  Weight:  63.7 kg      Intake/Output Summary (Last 24 hours) at 06/04/2020 0814 Last data filed at 06/04/2020 0130 Gross per 24 hour  Intake 115.46 ml  Output --  Net 115.46 ml   Last 3 Weights 06/04/2020 06/01/2020 05/23/2020  Weight (lbs) 140 lb 8 oz 140 lb 140 lb  Weight (kg) 63.73 kg 63.504 kg 63.504 kg      Telemetry    NSR rare PVC - Personally Reviewed  ECG    NSR incomplete LBBB - Personally Reviewed  Physical Exam   GEN: No acute distress.   Neck: No JVD Cardiac: RRR, no murmurs, rubs, or gallops.  Respiratory: Clear to auscultation bilaterally. GI: Soft, nontender, non-distended  MS: No edema; No deformity. Neuro:  Nonfocal  Psych: Normal affect   Labs    High Sensitivity Troponin:   Recent Labs  Lab 06/01/20 1415 06/01/20 1615 06/03/20 1859 06/04/20 0226  TROPONINIHS 4 6 9 147*      Chemistry Recent Labs  Lab 06/01/20 1415  06/03/20 1859  NA 141 140  K 3.8 4.1  CL 109 110  CO2 24 22  GLUCOSE 90 87  BUN 20 15  CREATININE 1.05* 1.20*  CALCIUM 9.1 9.2  PROT  --  6.8  ALBUMIN  --  3.9  AST  --  21  ALT  --  13  ALKPHOS  --  56  BILITOT  --  0.9  GFRNONAA 56* 48*  ANIONGAP 8 8     Hematology Recent Labs  Lab 06/01/20 1415 06/03/20 1859 06/04/20 0226  WBC 7.5 8.3 7.0  RBC 3.88 3.86* 3.72*  HGB 10.4* 10.3* 9.8*  HCT 34.1* 33.3* 31.6*  MCV 87.9 86.3 84.9  MCH 26.8 26.7 26.3  MCHC 30.5 30.9 31.0  RDW 14.6 14.6 14.6  PLT 288 282 276    BNPNo results for input(s): BNP, PROBNP in the last 168 hours.   DDimer No results for input(s): DDIMER in the last 168 hours.   Radiology    No results found.  Cardiac Studies   Cardiac cath 07/11/19:  CORONARY/GRAFT ACUTE MI REVASCULARIZATION  LEFT HEART CATH AND CORONARY ANGIOGRAPHY  TEMPORARY PACEMAKER  Conclusion    Prox RCA to Mid RCA lesion is 100% stenosed.    Post intervention, there is a 0% residual stenosis.  A drug-eluting stent was successfully placed using a STENT RESOLUTE ONYX [id number].  A drug-eluting stent was successfully placed using a STENT RESOLUTE ONYX 2.75X26.  The left ventricular systolic function is normal.  LV end diastolic pressure is normal.  The left ventricular ejection fraction is 55-65% by visual estimate.   1. 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 Low LVEDP 3. Normal LV function 4. Successful PCI of the RCA with DES x 2. 5. Resolution of cardiogenic shock and bradycardia.  Plan: DAPT for one year. Monitor in ICU. High dose statin. Will initiate beta blocker as BP allows.     Patient Profile     75 y.o. female with a h/o CAD s/p prior Inf STEMI complicated by cardiogenic shock HL, depression, anxiety, GERD, and arthritis, who presents from home for direct admission secondary to intermittent chest pain/unstable angina since 10/20.  Assessment & Plan     1. NSTEMI. Patient presents with progressive angina over the past 6 days. Nitrate responsive. Mild troponin lead to 147. Pain resolved on IV Ntg and heparin. Continue DAPT. On beta blocker and statin. Proceed with cardiac cath today. 2. HLD. LDL 95 on maximally tolerated statin and Zetia. Will need follow  Up in lipid clinic for initiation of PCSK 9 inhibitor.    For questions or updates, please contact [facility] Please consult [email or web address] for contact info under        Signed, Roma Bondar [place], MD  06/04/2020, 8:14 AM

## 2020-06-04 NOTE — Interval H&P Note (Signed)
Cath Lab Visit (complete for each Cath Lab visit)  Clinical Evaluation Leading to the Procedure:   ACS: Yes.    Non-ACS:    Anginal Classification: CCS IV  Anti-ischemic medical therapy: Minimal Therapy (1 class of medications)  Non-Invasive Test Results: No non-invasive testing performed  Prior CABG: No previous CABG      History and Physical Interval Note:  06/04/2020 11:45 AM  Sarah Pacheco  has presented today for surgery, with the diagnosis of chest pain.  The various methods of treatment have been discussed with the patient and family. After consideration of risks, benefits and other options for treatment, the patient has consented to  Procedure(s): LEFT HEART CATH AND CORONARY ANGIOGRAPHY (N/A) as a surgical intervention.  The patient's history has been reviewed, patient examined, no change in status, stable for surgery.  I have reviewed the patient's chart and labs.  Questions were answered to the patient's satisfaction.     Lance Muss

## 2020-06-04 NOTE — Progress Notes (Signed)
Progress Note  Patient Name: Sarah Pacheco Date of Encounter: 06/04/2020  [facility] Cardiologist: Rashun Grattan [place], MD   Subjective   Feels much better today. States pain resolved once IV Ntg started.   Inpatient Medications    Scheduled Meds: . aspirin EC  81 mg Oral Daily  . ezetimibe  10 mg Oral QHS  . metoprolol tartrate  12.5 mg Oral BID  . pantoprazole  40 mg Oral Daily  . simvastatin  40 mg Oral QHS  . sodium chloride flush  3 mL Intravenous Q12H  . ticagrelor  90 mg Oral BID   Continuous Infusions: . sodium chloride    . sodium chloride 1 mL/kg/hr (06/04/20 2831)  . heparin 750 Units/hr (06/04/20 0130)  . nitroGLYCERIN 15 mcg/min (06/04/20 0130)   PRN Meds: sodium chloride, acetaminophen, nitroGLYCERIN, ondansetron (ZOFRAN) IV, sodium chloride flush   Vital Signs    Vitals:   06/03/20 2300 06/04/20 0130 06/04/20 0500 06/04/20 0700  BP: (!) 106/54  112/74 123/62  Pulse:      Resp:    17  Temp:   98.5 F (36.9 C) 98.4 F (36.9 C)  TempSrc:   Oral Oral  SpO2: 93%  96% 93%  Weight:  63.7 kg      Intake/Output Summary (Last 24 hours) at 06/04/2020 0814 Last data filed at 06/04/2020 0130 Gross per 24 hour  Intake 115.46 ml  Output --  Net 115.46 ml   Last 3 Weights 06/04/2020 06/01/2020 05/23/2020  Weight (lbs) 140 lb 8 oz 140 lb 140 lb  Weight (kg) 63.73 kg 63.504 kg 63.504 kg      Telemetry    NSR rare PVC - Personally Reviewed  ECG    NSR incomplete LBBB - Personally Reviewed  Physical Exam   GEN: No acute distress.   Neck: No JVD Cardiac: RRR, no murmurs, rubs, or gallops.  Respiratory: Clear to auscultation bilaterally. GI: Soft, nontender, non-distended  MS: No edema; No deformity. Neuro:  Nonfocal  Psych: Normal affect   Labs    High Sensitivity Troponin:   Recent Labs  Lab 06/01/20 1415 06/01/20 1615 06/03/20 1859 06/04/20 0226  TROPONINIHS 4 6 9  147*      Chemistry Recent Labs  Lab 06/01/20 1415  06/03/20 1859  NA 141 140  K 3.8 4.1  CL 109 110  CO2 24 22  GLUCOSE 90 87  BUN 20 15  CREATININE 1.05* 1.20*  CALCIUM 9.1 9.2  PROT  --  6.8  ALBUMIN  --  3.9  AST  --  21  ALT  --  13  ALKPHOS  --  56  BILITOT  --  0.9  GFRNONAA 56* 48*  ANIONGAP 8 8     Hematology Recent Labs  Lab 06/01/20 1415 06/03/20 1859 06/04/20 0226  WBC 7.5 8.3 7.0  RBC 3.88 3.86* 3.72*  HGB 10.4* 10.3* 9.8*  HCT 34.1* 33.3* 31.6*  MCV 87.9 86.3 84.9  MCH 26.8 26.7 26.3  MCHC 30.5 30.9 31.0  RDW 14.6 14.6 14.6  PLT 288 282 276    BNPNo results for input(s): BNP, PROBNP in the last 168 hours.   DDimer No results for input(s): DDIMER in the last 168 hours.   Radiology    No results found.  Cardiac Studies   Cardiac cath 07/11/19:  CORONARY/GRAFT ACUTE MI REVASCULARIZATION  LEFT HEART CATH AND CORONARY ANGIOGRAPHY  TEMPORARY PACEMAKER  Conclusion    Prox RCA to Mid RCA lesion is 100% stenosed.  Post intervention, there is a 0% residual stenosis.  A drug-eluting stent was successfully placed using a STENT RESOLUTE ONYX [id number].  A drug-eluting stent was successfully placed using a STENT RESOLUTE ONYX 2.75X26.  The left ventricular systolic function is normal.  LV end diastolic pressure is normal.  The left ventricular ejection fraction is 55-65% by visual estimate.   1. 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 Low LVEDP 3. Normal LV function 4. Successful PCI of the RCA with DES x 2. 5. Resolution of cardiogenic shock and bradycardia.  Plan: DAPT for one year. Monitor in ICU. High dose statin. Will initiate beta blocker as BP allows.     Patient Profile     74 y.o. female with a h/o CAD s/p prior Inf STEMI complicated by cardiogenic shock HL, depression, anxiety, GERD, and arthritis, who presents from home for direct admission secondary to intermittent chest pain/unstable angina since 10/20.  Assessment & Plan     1. NSTEMI. Patient presents with progressive angina over the past 6 days. Nitrate responsive. Mild troponin lead to 147. Pain resolved on IV Ntg and heparin. Continue DAPT. On beta blocker and statin. Proceed with cardiac cath today. 2. HLD. LDL 95 on maximally tolerated statin and Zetia. Will need follow  Up in lipid clinic for initiation of PCSK 9 inhibitor.    For questions or updates, please contact [facility] Please consult [email or web address] for contact info under        Signed, Sina Sumpter [place], MD  06/04/2020, 8:14 AM

## 2020-06-04 NOTE — Progress Notes (Addendum)
Site area: Right  Groin a 6 french arterial sheath was removed by Loma Messing [facility]  Site Prior to Removal:  Level 0  Pressure Applied For   30 MINUTES    Bedrest Beginning at  1800 x 4 hours  Manual:   Yes.    Patient Status During Pull:  stable  Post Pull Groin Site:  Level 1- bruise  Post Pull Instructions Given:  Yes.    Post Pull Pulses Present:  Yes.    Dressing Applied:  Yes.    Comments:  Care instruction given to patient .  Pt given Zofran 4 mg IV for nausea during sheath pull.  BP remained stable

## 2020-06-05 ENCOUNTER — Encounter (HOSPITAL_COMMUNITY): Payer: Self-pay | Admitting: Interventional Cardiology

## 2020-06-05 ENCOUNTER — Telehealth: Payer: Self-pay | Admitting: Cardiology

## 2020-06-05 DIAGNOSIS — E785 Hyperlipidemia, unspecified: Secondary | ICD-10-CM | POA: Diagnosis not present

## 2020-06-05 DIAGNOSIS — I2510 Atherosclerotic heart disease of native coronary artery without angina pectoris: Secondary | ICD-10-CM | POA: Diagnosis not present

## 2020-06-05 DIAGNOSIS — I214 Non-ST elevation (NSTEMI) myocardial infarction: Secondary | ICD-10-CM | POA: Diagnosis not present

## 2020-06-05 LAB — BASIC METABOLIC PANEL
Anion gap: 10 (ref 5–15)
BUN: 13 mg/dL (ref 8–23)
CO2: 19 mmol/L — ABNORMAL LOW (ref 22–32)
Calcium: 9 mg/dL (ref 8.9–10.3)
Chloride: 109 mmol/L (ref 98–111)
Creatinine, Ser: 1.03 mg/dL — ABNORMAL HIGH (ref 0.44–1.00)
GFR, Estimated: 58 mL/min — ABNORMAL LOW (ref >60)
Glucose, Bld: 101 mg/dL — ABNORMAL HIGH (ref 70–99)
Potassium: 4.2 mmol/L (ref 3.5–5.1)
Sodium: 138 mmol/L (ref 135–145)

## 2020-06-05 LAB — CBC
HCT: 29.3 % — ABNORMAL LOW (ref 36.0–46.0)
Hemoglobin: 9 g/dL — ABNORMAL LOW (ref 12.0–15.0)
MCH: 26.5 pg (ref 26.0–34.0)
MCHC: 30.7 g/dL (ref 30.0–36.0)
MCV: 86.4 fL (ref 80.0–100.0)
Platelets: 274 10*3/uL (ref 150–400)
RBC: 3.39 MIL/uL — ABNORMAL LOW (ref 3.87–5.11)
RDW: 14.7 % (ref 11.5–15.5)
WBC: 10 10*3/uL (ref 4.0–10.5)
nRBC: 0 % (ref 0.0–0.2)

## 2020-06-05 MED ORDER — METOPROLOL SUCCINATE ER 25 MG PO TB24: 25.0000 mg | ORAL_TABLET | Freq: Every day | ORAL | Status: DC

## 2020-06-05 MED ORDER — METOPROLOL SUCCINATE ER 25 MG PO TB24
25.0000 mg | ORAL_TABLET | Freq: Every day | ORAL | Status: DC
  Administered 2020-06-05: 25 mg via ORAL
  Filled 2020-06-05: qty 1

## 2020-06-05 MED FILL — Verapamil HCl IV Soln 2.5 MG/ML: INTRAVENOUS | Qty: 2 | Status: AC

## 2020-06-05 NOTE — Discharge Instructions (Signed)

## 2020-06-05 NOTE — Plan of Care (Signed)

## 2020-06-05 NOTE — Plan of Care (Signed)

## 2020-06-05 NOTE — Progress Notes (Signed)
CARDIAC REHAB PHASE I   PRE:  Rate/Rhythm: 71 SR  BP:  Supine: 153/59  Sitting:   Standing:    SaO2: 98%RA  MODE:  Ambulation: 430 ft   POST:  Rate/Rhythm: 97 SR  BP:  Supine:   Sitting: 155/74  Standing:    SaO2: 100%RA 0805-0855 Pt walked 430 ft on RA with steady gait and no CP. Tolerated well. Sats good on RA. Discussed MI restrictions, NTG use, importance of brilinta , walking for exercise, gave heart healthy diet and and discussed CRP 2. Will send referral letter to [facility] CRP 2. Pt voiced understanding of ed.   Luetta Nutting, RN BSN  06/05/2020 8:52 AM

## 2020-06-05 NOTE — Telephone Encounter (Signed)
Patient has TOC appt on 11/12.

## 2020-06-05 NOTE — Discharge Summary (Signed)
Discharge Summary    Patient ID: Sarah Pacheco MRN: [id number]; DOB: 01-15-1946  Admit date: 06/03/2020 Discharge date: 06/05/2020  Primary Care Provider: [facility]  Primary Cardiologist: Peter [place], MD  Primary Electrophysiologist:  None   Discharge Diagnoses    Principal Problem:   Non-ST elevation (NSTEMI) myocardial infarction [facility]) Active Problems:   Hyperlipidemia with target LDL less than 70   CAD in native artery   Unstable angina [facility])  Diagnostic Studies/Procedures    Cath: 06/04/20    Prox RCA to Mid RCA lesion is 99% stenosed. Mid RCA lesion is 95% stenosed.  Scoring balloon angioplasty was performed using a BALLOON WOLVERINE 3.00X10, followed by a 3.5 [place] balloon, on both lesions.  Post intervention, there is a 0% residual stenosis.  The left ventricular systolic function is normal.  LV end diastolic pressure is normal.  The left ventricular ejection fraction is 55-65% by visual estimate.  There is no aortic valve stenosis.   Continue aggressive secondary prevention.  Patient with some residual pain, likely related to some decreased flow in marginal branches.    Diagnostic Dominance: Right  Intervention     _____________   History of Present Illness     Sarah Pacheco is a 74 y.o. female with PMH of CAD s/p inferior STEMI complicated by VF arrest(during cath)and cardiogenic shock in 07/2019 requiring DES x 2. Other h/o includes HL, GERD, anxiety, depression, and OA. Echo at the time of her MI showed nl LV fxn w/o rwma. She was recently seen in cardiology clinic on October 14, at which time she was doing relatively well. She did report occasional breathlessness when going up stairs. It was noted that her most recent LDL was still not at goal, at 97 and she will follow up in lipid clinic for initiation of PCSK9 inhibitor (previous intolerant to Lipitor and Crestor-currently on Vytorin).  Unfortunately, beginning on  the evening of October 20, Ms.Stovallbegan to experience intermittent substernal chest discomfort that was most notable while lying down and also with activity. When occurring at rest, symptoms did seem to improve with sitting up or sublingual nitroglycerin. Exertional symptoms eased with rest. Due to intermittent symptoms, she presented to the emergency department October 23 where troponins were normal x2. ECG showed subtle inferolateral ST depression. She was pain-free in the emergency department and was subsequently discharged home with plan for outpatient cardiology follow-up. Unfortunately, over the past 5 days prior to admission, she has continued to have intermittent rest and exertional substernal chest heaviness, now associated with occasional diaphoresis as well as radiation to the shoulders and down bilateral arms to the fingers. Symptoms are very reminiscent of angina prior to MI. Last night, she had a very restless night with intermittent substernal chest heaviness prompting her to sit up and or take nitroglycerin. She says she slept very poorly throughout the night. Because of persistent symptoms overnight, she contacted our office today and was advised to present for direct admission. She was chest pain-free at the time of assessment but did experience chest discomfort when walking through the parking lot of the hospital.   Hospital Course     1. NSTEMI: presented with progressive angina over the past 6 days prior to admission. hsTn 147. Underwent cardiac cath noted above with balloon angioplasty with cutting balloon to two lesions in the mRCA. LVEF noted at  55-65% on LV gram. Continued on DAPT with ASA/Brilinta for at least one year. Worked well with cardiac rehab without recurrent  chest pain.  2. HLD: Recent LDL of 97 despite Vytorin therapy. She is intolerant to rosuvastatin and atorvastatin. Has seen Dr. Rennis Golden in the past regarding possible PCSK9 initiation. Will send message  for close follow up in the lipid clinic. Originally planned for 08/2020.   3. HTN: continue on BB therapy.   Did the patient have an acute coronary syndrome (MI, NSTEMI, STEMI, etc) this admission?:  Yes                               AHA/ACC Clinical Performance & Quality Measures: 1. Aspirin prescribed? - Yes 2. ADP Receptor Inhibitor (Plavix/Clopidogrel, Brilinta/Ticagrelor or Effient/Prasugrel) prescribed (includes medically managed patients)? - Yes 3. Beta Blocker prescribed? - Yes 4. High Intensity Statin (Lipitor 40-80mg  or Crestor 20-40mg ) prescribed? - No - intolerant, planned for lipid clinic follow up 5. EF assessed during THIS hospitalization? - Yes 6. For EF <40%, was ACEI/ARB prescribed? - Not Applicable (EF >/= 40%) 7. For EF <40%, Aldosterone Antagonist (Spironolactone or Eplerenone) prescribed? - Not Applicable (EF >/= 40%) 8. Cardiac Rehab Phase II ordered (including medically managed patients)? - Yes   _____________  Discharge Vitals Blood pressure 103/69, pulse 74, temperature 98.4 F (36.9 C), temperature source Oral, resp. rate 18, height 5\' 3"  (1.6 m), weight 64.5 kg, SpO2 95 %.  Filed Weights   06/04/20 0130 06/05/20 0219  Weight: 63.7 kg 64.5 kg    Labs & Radiologic Studies    CBC Recent Labs    06/03/20 1859 06/03/20 1859 06/04/20 0226 06/05/20 0131  WBC 8.3   < > 7.0 10.0  NEUTROABS 3.8  --   --   --   HGB 10.3*   < > 9.8* 9.0*  HCT 33.3*   < > 31.6* 29.3*  MCV 86.3   < > 84.9 86.4  PLT 282   < > 276 274   < > = values in this interval not displayed.   Basic Metabolic Panel Recent Labs    06/07/20 1859 06/05/20 0131  NA 140 138  K 4.1 4.2  CL 110 109  CO2 22 19*  GLUCOSE 87 101*  BUN 15 13  CREATININE 1.20* 1.03*  CALCIUM 9.2 9.0  MG 2.2  --    Liver Function Tests Recent Labs    06/03/20 1859  AST 21  ALT 13  ALKPHOS 56  BILITOT 0.9  PROT 6.8  ALBUMIN 3.9   No results for input(s): LIPASE, AMYLASE in the last 72  hours. High Sensitivity Troponin:   Recent Labs  Lab 06/01/20 1415 06/01/20 1615 06/03/20 1859 06/04/20 0226  TROPONINIHS 4 6 9  147*    BNP Invalid input(s): POCBNP D-Dimer No results for input(s): DDIMER in the last 72 hours. Hemoglobin A1C No results for input(s): HGBA1C in the last 72 hours. Fasting Lipid Panel No results for input(s): CHOL, HDL, LDLCALC, TRIG, CHOLHDL, LDLDIRECT in the last 72 hours. Thyroid Function Tests No results for input(s): TSH, T4TOTAL, T3FREE, THYROIDAB in the last 72 hours.  Invalid input(s): FREET3 _____________  DG Chest 2 View  Result Date: 06/01/2020 CLINICAL DATA:  Chest pain. EXAM: CHEST - 2 VIEW COMPARISON:  None. FINDINGS: The heart size and mediastinal contours are within normal limits. There is no evidence of pulmonary edema, consolidation, pneumothorax, nodule or pleural fluid. The visualized skeletal structures are unremarkable. IMPRESSION: No active cardiopulmonary disease. Electronically Signed   By: M.D.   On:  06/01/2020 14:49   CARDIAC CATHETERIZATION  Result Date: 06/04/2020  Prox RCA to Mid RCA lesion is 99% stenosed. Mid RCA lesion is 95% stenosed.  Scoring balloon angioplasty was performed using a BALLOON WOLVERINE 3.00X10, followed by a 3.5 [place] balloon, on both lesions.  Post intervention, there is a 0% residual stenosis.  The left ventricular systolic function is normal.  LV end diastolic pressure is normal.  The left ventricular ejection fraction is 55-65% by visual estimate.  There is no aortic valve stenosis.  Continue aggressive secondary prevention.  Patient with some residual pain, likely related to some decreased flow in marginal branches.    Disposition   Pt is being discharged home today in good condition.  Follow-up Plans & Appointments     Follow-up Information    [facility].   Specialty: Family Medicine Contact information: [address] [place]  [id number] [phone]        Leone BrandIngold, Laura R, NP Follow up on 06/21/2020.   Specialties: Cardiology, Radiology Why: at 11:15am for your follow up appt. This will be at the [address] office!!! Contact information: [address] [place] [address] [phone]              Discharge Instructions    Amb Referral to Cardiac Rehabilitation   Complete by: As directed    Referring to [facility] 2   Diagnosis:  PTCA NSTEMI     After initial evaluation and assessments completed: Virtual Based Care may be provided alone or in conjunction with Phase 2 Cardiac Rehab based on patient barriers.: Yes   Diet - low sodium heart healthy   Complete by: As directed    Discharge instructions   Complete by: As directed    Radial Site Care Refer to this sheet in the next few weeks. These instructions provide you with information on caring for yourself after your procedure. Your caregiver may also give you more specific instructions. Your treatment has been planned according to current medical practices, but problems sometimes occur. Call your caregiver if you have any problems or questions after your procedure. HOME CARE INSTRUCTIONS You may shower the day after the procedure.Remove the bandage (dressing) and gently wash the site with plain soap and water.Gently pat the site dry.  Do not apply powder or lotion to the site.  Do not submerge the affected site in water for 3 to 5 days.  Inspect the site at least twice daily.  Do not flex or bend the affected arm for 24 hours.  No lifting over 5 pounds (2.3 kg) for 5 days after your procedure.  Do not drive home if you are discharged the same day of the procedure. Have someone else drive you.  You may drive 24 hours after the procedure unless otherwise instructed by your caregiver.  What to expect: Any bruising will usually fade within 1 to 2 weeks.  Blood that collects in the tissue (hematoma) may be painful to the touch. It  should usually decrease in size and tenderness within 1 to 2 weeks.  SEEK IMMEDIATE MEDICAL CARE IF: You have unusual pain at the radial site.  You have redness, warmth, swelling, or pain at the radial site.  You have drainage (other than a small amount of blood on the dressing).  You have chills.  You have a fever or persistent symptoms for more than 72 hours.  You have a fever and your symptoms suddenly get worse.  Your arm becomes pale, cool, tingly, or numb.  You have heavy bleeding from the site. Hold pressure on the site.   PLEASE DO NOT MISS ANY DOSES OF YOUR BRILINTA!!!!! Also keep a log of you blood pressures and bring back to your follow up appt. Please call the office with any questions.   Patients taking blood thinners should generally stay away from medicines like ibuprofen, Advil, Motrin, naproxen, and Aleve due to risk of stomach bleeding. You may take Tylenol as directed or talk to your primary doctor about alternatives.   Increase activity slowly   Complete by: As directed       Discharge Medications   Allergies as of 06/05/2020      Reactions   Atorvastatin Other (See Comments)   Cramps in legs   Codeine Nausea And Vomiting   Erythromycin Base Nausea Only   Penicillin G Hives   Did it involve swelling of the face/tongue/throat, SOB, or low BP? No Did it involve sudden or severe rash/hives, skin peeling, or any reaction on the inside of your mouth or nose? Yes Did you need to seek medical attention at a hospital or doctor's office? Yes When did it last happen?2010 If all above answers are "NO", may proceed with cephalosporin use.   Rosuvastatin Other (See Comments)   Muscle pain      Medication List    TAKE these medications   acetaminophen 500 MG tablet Commonly known as: TYLENOL Take 1,000 mg by mouth every 6 (six) hours as needed for mild pain or headache.   aspirin 81 MG EC tablet Take 1 tablet (81 mg total) by mouth daily.   EQL Omega 3 Fish Oil  1200 MG Caps Take 1,200 mg by mouth daily.   ezetimibe 10 MG tablet Commonly known as: ZETIA Take 1 tablet (10 mg total) by mouth daily. What changed: when to take this   metoprolol tartrate 25 MG tablet Commonly known as: LOPRESSOR Take 1 tablet (25 mg total) by mouth daily.   nitroGLYCERIN 0.4 MG SL tablet Commonly known as: NITROSTAT Place 1 tablet (0.4 mg total) under the tongue every 5 (five) minutes as needed for chest pain (up to 3 doses. If taking 3rd dose call 911).   pantoprazole 40 MG tablet Commonly known as: PROTONIX Take 1 tablet (40 mg total) by mouth daily.   simvastatin 40 MG tablet Commonly known as: ZOCOR Take 1 tablet (40 mg total) by mouth daily. What changed: when to take this   ticagrelor 90 MG Tabs tablet Commonly known as: BRILINTA Take 1 tablet (90 mg total) by mouth 2 (two) times daily.   Vitamin B-12 5000 MCG Tbdp Take 5,000 mcg by mouth daily.        Outstanding Labs/Studies   Lipid clinic follow up  Duration of Discharge Encounter   Greater than 30 minutes including physician time.  Signed, Laverda Page, NP 06/05/2020, 10:30 AM

## 2020-06-06 NOTE — Telephone Encounter (Signed)
**Note De-Identified Sarah Pacheco Obfuscation** Patient contacted regarding discharge from [facility] on 06/05/2020.  Patient understands to follow up with provider Nada Boozer, NP on 06/21/2020 at 11:15 at [address], [address] in [place], [place] [address]. Patient understands discharge instructions? Yes Patient understands medications and regiment? Yes Patient understands to bring all medications to this visit? Yes  Ask patient:  Are you enrolled in My Chart: Yes  The pt reports that she is currently "feeling good" and denies CP/discomfort, SOB, nausea, diaphoresis, dizziness, or headaches but states that she is  "a little tired".  She thanked me for calling her and is aware to call us if she has any questions or concerns.

## 2020-06-12 ENCOUNTER — Other Ambulatory Visit: Payer: Self-pay

## 2020-06-12 ENCOUNTER — Ambulatory Visit: Payer: Medicare Other | Admitting: Internal Medicine

## 2020-06-12 ENCOUNTER — Encounter: Payer: Self-pay | Admitting: Internal Medicine

## 2020-06-12 ENCOUNTER — Telehealth: Payer: Self-pay | Admitting: Internal Medicine

## 2020-06-12 VITALS — BP 132/82 | HR 59 | Ht 63.0 in | Wt 143.0 lb

## 2020-06-12 DIAGNOSIS — T466X5A Adverse effect of antihyperlipidemic and antiarteriosclerotic drugs, initial encounter: Secondary | ICD-10-CM

## 2020-06-12 DIAGNOSIS — M791 Myalgia, unspecified site: Secondary | ICD-10-CM | POA: Diagnosis not present

## 2020-06-12 DIAGNOSIS — I2510 Atherosclerotic heart disease of native coronary artery without angina pectoris: Secondary | ICD-10-CM

## 2020-06-12 DIAGNOSIS — E785 Hyperlipidemia, unspecified: Secondary | ICD-10-CM

## 2020-06-12 MED ORDER — REPATHA SURECLICK 140 MG/ML ~~LOC~~ SOAJ: 1.0000 | SUBCUTANEOUS | 3 refills | Status: DC

## 2020-06-12 NOTE — Telephone Encounter (Signed)
PA for Repatha submitted via CMM (Key: [id number])

## 2020-06-12 NOTE — Patient Instructions (Signed)
Medication Instructions:  Dr. Rennis Golden recommends Repatha 140mg /mL (PCSK9). This is an injectable cholesterol medication self-administered once every 14 days. This medication will likely need prior approval with your insurance company, which we will work on. If the medication is not approved initially, we may need to do an appeal with your insurance. We will keep you updated on this process.   Administer medication in area of fatty tissue such as abdomen, outer thigh, back up of arm - and rotate site with each injection Store medication in refrigerator until ready to administer - allow to sit at room temp for 30 mins - 1 hour prior to injection Dispose of medication in a SHARPS container - your pharmacy should be able to direct you on this and proper disposal   If you need co-pay assistance grant, please look into the program at healthwellfoundation.org >> disease funds >> hypercholesterolemia. This is an online application or you can call to complete. Once approved, you will provide the "pharmacy card" information to your pharmacy and they will deduct the co-pays from this grant.   *If you need a refill on your cardiac medications before your next appointment, please call your pharmacy*   Lab Work: FASTING lipid panel due before your next visit with Dr.   If you have labs (blood work) drawn today and your tests are completely normal, you will receive your results only by: Rennis Golden MyChart Message (if you have MyChart) OR . A paper copy in the mail If you have any lab test that is abnormal or we need to change your treatment, we will call you to review the results.   Testing/Procedures: NONE   Follow-Up: At [facility], you and your health needs are our priority.  As part of our continuing mission to provide you with exceptional heart care, we have created designated Provider Care Teams.  These Care Teams include your primary Cardiologist (physician) and Advanced Practice Providers (APPs -   Physician Assistants and Nurse Practitioners) who all work together to provide you with the care you need, when you need it.  We recommend signing up for the patient portal called "MyChart".  Sign up information is provided on this After Visit Summary.  MyChart is used to connect with patients for Virtual Visits (Telemedicine).  Patients are able to view lab/test results, encounter notes, upcoming appointments, etc.  Non-urgent messages can be sent to your provider as well.   To learn more about what you can do with MyChart, go to [facility].    Your next appointment:   September 04, 2020 with Dr. September 06, 2020

## 2020-06-12 NOTE — Progress Notes (Signed)
LIPID CLINIC CONSULT NOTE  Chief Complaint:  Follow-up dyslipidemia  Primary Care Physician: No primary care provider on file.  Primary Cardiologist:  Peter Swaziland, MD  HPI:  Sarah Pacheco is a 74 y.o. female who is being seen today for the evaluation of dyslipidemia at the request of Pa, [facility] This is a 74 year old female with a history of dyslipidemia (LDL greater than 200), anxiety, depression and coronary disease.  She presented in early December with substernal chest pain and was found to have inferior ST elevation MI complicated by cardiogenic shock.  She required 2 stents to the proximal RCA however LVEF was normal.  Prior to this her LDL cholesterol had been in the 120s on ezetimibe and simvastatin, however just prior to the procedure LDL was at the low 80s and then 64 the day after, likely owing to the effects of acute MI.  Based on this her Zetia and simvastatin were discontinued and she was switched to rosuvastatin 2 mg daily.  After taking this for several weeks she had significant myalgias and discontinued the medication.  She was seen in follow-up by Azalee Course on December 11 who referred her to the lipid clinic for further evaluation.  11/14/2019  Sarah Pacheco returns today for follow-up.  I saw her virtually at her last office visit.  As outlined above she had coronary artery disease with cardiogenic shock and inferior ST elevation MI.  Her lipids were above target.  We discussed the combination simvastatin and ezetimibe (Vytorin) and she was successfully placed on that.  Her lipids as of December showed marked improvement with an LDL 64, however more recently her LDLs trended back up to 85.  Total cholesterol was 162 and triglycerides were 115.  She notes a little less activity although recently has started to become more active.  She is asymptomatic with that.  She says that her diet tends to be pretty good except for sweets and baked goods which may contain  butter and other sources of saturated fat.  06/12/2020  Sarah Pacheco seen today for follow-up of dyslipidemia.  Unfortunately the other week she developed symptoms concerning for angina.  I discussed her case with Dr. Swaziland who recommended her to come in for cardiac catheterization.  This demonstrated a 95% mid RCA stenosis and a proximal to mid 99% stenosis of a previously treated lesion.  She underwent scoring balloon angioplasty with IVUS guidance.  This improved her symptoms significantly.  She did have a repeat lipid profile recently which showed total cholesterol 175, triglycerides 130, HDL 55 and LDL 97.  This is on ezetimibe and simvastatin 40 mg daily.  We discussed that she remains above a target LDL less than 70 and could drive further risk reduction with further improvement.  PMHx:  Past Medical History:  Diagnosis Date   Anxiety    Arthritis    CAD (coronary artery disease)    a. 07/2019 Inf STEMI/VF Arrest/CGS/PCI: LM nl, LADmin irregs, LCX nl, RCA 100p/m (2.75x38 & 2.75x26 Resolute Onyx DESs).   Cataract    Chest pain    Depression    GERD (gastroesophageal reflux disease)    History of echocardiogram    a. 07/2019 Echo: EF 60-65%, no rwma, nl RV size/fxn, mildly dil LA, mild MR.   Hyperlipidemia    Hyperlipidemia with target LDL less than 70 10/25/2015    Past Surgical History:  Procedure Laterality Date   APPENDECTOMY     CATARACT EXTRACTION, BILATERAL  COLONOSCOPY  2017   X2     colon polyps   CORONARY BALLOON ANGIOPLASTY N/A 06/04/2020   Procedure: CORONARY BALLOON ANGIOPLASTY;  Surgeon: Corky Crafts, MD;  Location: [facility] INVASIVE CV LAB;  Service: Cardiovascular;  Laterality: N/A;   CORONARY/GRAFT ACUTE MI REVASCULARIZATION N/A 07/11/2019   Procedure: CORONARY/GRAFT ACUTE MI REVASCULARIZATION;  Surgeon: Swaziland, Peter M, MD;  [facility] INVASIVE CV LAB;  Service: Cardiovascular;  Laterality: N/A;   INTRAVASCULAR ULTRASOUND/IVUS N/A  06/04/2020   Procedure: Intravascular Ultrasound/IVUS;  Surgeon: Corky Crafts, MD;  Location[facility] CV LAB;  Service: Cardiovascular;  Laterality: N/A;   LAPAROTOMY N/A 10/10/2018   Procedure: EXPLORATORY LAPAROTOMY RIGHT COLECTOMY;  Surgeon: Emelia Loron, MD;  Location: [facility] ORS;  Service: General;  Laterality: N/A;   LEFT HEART CATH AND CORONARY ANGIOGRAPHY N/A 07/11/2019   Procedure: LEFT HEART CATH AND CORONARY ANGIOGRAPHY;  Surgeon: Swaziland, Peter M, MD;  Location: [facility] INVASIVE CV LAB;  Service: Cardiovascular;  Laterality: N/A;   LEFT HEART CATH AND CORONARY ANGIOGRAPHY N/A 06/04/2020   Procedure: LEFT HEART CATH AND CORONARY ANGIOGRAPHY;  Surgeon: Corky Crafts, MD;  Location: [facility] INVASIVE CV LAB;  Service: Cardiovascular;  Laterality: N/A;   TEMPORARY PACEMAKER N/A 07/11/2019   Procedure: TEMPORARY PACEMAKER;  Surgeon: Swaziland, Peter M, MD;  Location: [facility] INVASIVE CV LAB;  Service: Cardiovascular;  Laterality: N/A;   VAGINAL HYSTERECTOMY  1973   AUB, fibroids    FAMHx:  Family History  Problem Relation Age of Onset   Heart attack Father 88       MI   Heart disease Father    Heart disease Sister    Heart attack Brother 54       MI   Heart attack Sister    Heart disease Sister    Colon cancer Sister 22   Colon polyps Sister    Heart disease Brother    Heart failure Brother    Colon cancer Brother 75   Colon polyps Brother    COPD Brother    Lung cancer Brother    Esophageal cancer Neg Hx    Rectal cancer Neg Hx    Stomach cancer Neg Hx     SOCHx:   reports that she has quit smoking. Her smoking use included cigarettes. She has a 7.50 pack-year smoking history. She has never used smokeless tobacco. She reports that she does not drink alcohol and does not use drugs.  ALLERGIES:  Allergies  Allergen Reactions   Atorvastatin Other (See Comments)    Cramps in legs   Codeine Nausea And Vomiting   Erythromycin Base Nausea Only    Penicillin G Hives    Did it involve swelling of the face/tongue/throat, SOB, or low BP? No Did it involve sudden or severe rash/hives, skin peeling, or any reaction on the inside of your mouth or nose? Yes Did you need to seek medical attention at a hospital or doctor's office? Yes When did it last happen?2010 If all above answers are "NO", may proceed with cephalosporin use.    Rosuvastatin Other (See Comments)    Muscle pain    ROS: Pertinent items noted in HPI and remainder of comprehensive ROS otherwise negative.  HOME MEDS: Current Outpatient Medications on File Prior to Visit  Medication Sig Dispense Refill   acetaminophen (TYLENOL) 500 MG tablet Take 1,000 mg by mouth every 6 (six) hours as needed for mild pain or headache.      aspirin EC 81 MG EC  tablet Take 1 tablet (81 mg total) by mouth daily. 90 tablet 3   Cyanocobalamin (VITAMIN B-12) 5000 MCG TBDP Take 5,000 mcg by mouth daily.      ezetimibe (ZETIA) 10 MG tablet Take 1 tablet (10 mg total) by mouth daily. (Patient taking differently: Take 10 mg by mouth at bedtime. ) 90 tablet 3   metoprolol tartrate (LOPRESSOR) 25 MG tablet Take 1 tablet (25 mg total) by mouth daily. 90 tablet 3   nitroGLYCERIN (NITROSTAT) 0.4 MG SL tablet Place 1 tablet (0.4 mg total) under the tongue every 5 (five) minutes as needed for chest pain (up to 3 doses. If taking 3rd dose call 911). 25 tablet 3   Omega-3 Fatty Acids (EQL OMEGA 3 FISH OIL) 1200 MG CAPS Take 1,200 mg by mouth daily.     pantoprazole (PROTONIX) 40 MG tablet Take 1 tablet (40 mg total) by mouth daily. 30 tablet 6   simvastatin (ZOCOR) 40 MG tablet Take 1 tablet (40 mg total) by mouth daily. (Patient taking differently: Take 40 mg by mouth at bedtime. ) 90 tablet 3   ticagrelor (BRILINTA) 90 MG TABS tablet Take 1 tablet (90 mg total) by mouth 2 (two) times daily. 180 tablet 3   No current facility-administered medications on file prior to visit.     LABS/IMAGING: No results found for this or any previous visit (from the past 48 hour(s)). No results found.  LIPID PANEL:    Component Value Date/Time   CHOL 175 05/20/2020 0928   TRIG 130 05/20/2020 0928   HDL 55 05/20/2020 0928   CHOLHDL 3.2 05/20/2020 0928   CHOLHDL 2.4 07/12/2019 0244   VLDL 14 07/12/2019 0244   LDLCALC 97 05/20/2020 0928    WEIGHTS: Wt Readings from Last 3 Encounters:  06/12/20 143 lb (64.9 kg)  06/05/20 142 lb 3.2 oz (64.5 kg)  06/01/20 140 lb (63.5 kg)    VITALS: BP 132/82    Pulse (!) 59    Ht 5\' 3"  (1.6 m)    Wt 143 lb (64.9 kg)    SpO2 94%    BMI 25.33 kg/m   EXAM: General appearance: alert and no distress Lungs: clear to auscultation bilaterally Heart: regular rate and rhythm Extremities: extremities normal, atraumatic, no cyanosis or edema Neurologic: Grossly normal  EKG: Sinus bradycardia 59, low voltage QRS-personally reviewed  ASSESSMENT: 1. CAD with recent inferior STEMI (07/2019), status post DES x2 to the RCA -repeat obstructive coronary disease to the RCA with Cutting Balloon angioplasty 05/2020) 2. Recent cardiogenic shock 3. Dyslipidemia with intolerance to high potency Crestor 4. Goal LDL less than 70  PLAN: 1.  Ms.Cortinas has had decent cholesterol reduction on 40 mg simvastatin and 10 mg of ezetimibe which she seems to be tolerating however her LDL remains above target.  She is a good candidate to add a PCSK9 inhibitor and would recommend Repatha.  Plan repeat lipids in 2 to 3 months prior to and established follow-up appointment with me in January.  February, MD, [facility], FACP  [place]   [facility]  Medical Director of the Advanced Lipid Disorders &  Cardiovascular Risk Reduction Clinic Diplomate of the American Board of Clinical Lipidology Attending Cardiologist  Direct Dial: [phone]   Fax: [phone]  Website:  [email or web address] Tyara Dassow 06/12/2020, 10:30 AM

## 2020-06-13 NOTE — Telephone Encounter (Signed)
Approved with plan 06/12/2020 through 06/12/2021

## 2020-06-20 NOTE — Progress Notes (Signed)
Cardiology Office Note   Date:  06/21/2020   ID:  Sarah Pacheco, DOB 1946-03-08, MRN [id number]  PCP:  No primary care provider on file.  Cardiologist:  Dr. Swaziland    Chief Complaint  Patient presents with  . Hospitalization Follow-up      History of Present Illness: Sarah Pacheco is a 74 y.o. female who presents for post hospitalization had angina and cardiac cath planned  PMH of CAD s/p inferior STEMI complicated by VF arrest(during cath)and cardiogenic shock in 07/2019 requiring DES x 2. Other h/o includes HL, GERD, anxiety, depression, and OA. Echo at the time of her MI showed nl LV fxn w/o rwma. She was recently seen in cardiology clinic on October 14, at which time she was doing relatively well. She did report occasional breathlessness when going up stairs. It was noted that her most recent LDL was still not at goal, at 97 and she will follow up in lipid clinic for initiation of PCSK9 inhibitor (previous intolerant to Lipitor and Crestor-currently on Vytorin). Cardiac cath 10/26/21scoring balloon PTCA on both pros to mid RCA lesion and mRCA lesion.  Ef 55-65%  Continue aggressive secondary prevention.  Patient with some residual pain, likely related to some decreased flow in marginal branches  Then evening of October 20, Ms.Stovallbegan to experience intermittent substernal chest discomfort that was most notable while lying down and also with activity. When occurring at rest, symptoms did seem to improve with sitting up or sublingual nitroglycerin. Exertional symptoms eased with rest. Due to intermittent symptoms, she presented to the emergency department October 23 where troponins were normal x2. ECG showed subtle inferolateral ST depression. She was pain-free in the emergency department and was subsequently discharged home with plan for outpatient cardiology follow-up. Unfortunately, over the past 5 days prior to admission, she has continued to have intermittent  rest and exertional substernal chest heaviness, now associated with occasional diaphoresis as well as radiation to the shoulders and down bilateral arms to the fingers. Symptoms are very reminiscent of angina prior to MI.  She was admitted and underwent cath with need for cutting balloon PTCA  Today + fatigue but less SOB and fatigue compared to prior to procedure.  She has no chest pain.  She is feeling depressed, she has been a lot in over a year.  She also cries freq.  We discussed would need medication short term anyway..  She is agreeable but does not have PCP, she made appt but it is not until May.  She will look for another.  No problems with meds. Reviewed hospital notes and labs, she is anemic will recheck.  She is on lopressor 25 once a day when prior she was on torpol XL daily, will divide lopressor at 12.5 BID, this may give her more energy.     Past Medical History:  Diagnosis Date  . Anxiety   . Arthritis   . CAD (coronary artery disease)    a. 07/2019 Inf STEMI/VF Arrest/CGS/PCI: LM nl, LADmin irregs, LCX nl, RCA 100p/m (2.75x38 & 2.75x26 Resolute Onyx DESs).  . Cataract   . Chest pain   . Depression   . GERD (gastroesophageal reflux disease)   . History of echocardiogram    a. 07/2019 Echo: EF 60-65%, no rwma, nl RV size/fxn, mildly dil LA, mild MR.  Marland Kitchen Hyperlipidemia   . Hyperlipidemia with target LDL less than 70 10/25/2015    Past Surgical History:  Procedure Laterality Date  . APPENDECTOMY    .  CATARACT EXTRACTION, BILATERAL    . COLONOSCOPY  2017   X2     colon polyps  . CORONARY BALLOON ANGIOPLASTY N/A 06/04/2020   Procedure: CORONARY BALLOON ANGIOPLASTY;  Surgeon: Corky CraftsVaranasi, Jayadeep S, MD;  [facility];  Service: Cardiovascular;  Laterality: N/A;  . CORONARY/GRAFT ACUTE MI REVASCULARIZATION N/A 07/11/2019   Procedure: CORONARY/GRAFT ACUTE MI REVASCULARIZATION;  Surgeon: SwazilandJordan, Peter M, MD;  [facility];  Service: Cardiovascular;   Laterality: N/A;  . INTRAVASCULAR ULTRASOUND/IVUS N/A 06/04/2020   Procedure: Intravascular Ultrasound/IVUS;  Surgeon: Corky CraftsVaranasi, Jayadeep S, MD;  [facility];  Service: Cardiovascular;  Laterality: N/A;  . LAPAROTOMY N/A 10/10/2018   Procedure: EXPLORATORY LAPAROTOMY RIGHT COLECTOMY;  Surgeon: Emelia LoronWakefield, Matthew, MD;  Location: [facility] ORS;  Service: General;  Laterality: N/A;  . LEFT HEART CATH AND CORONARY ANGIOGRAPHY N/A 07/11/2019   Procedure: LEFT HEART CATH AND CORONARY ANGIOGRAPHY;  Surgeon: SwazilandJordan, Peter M, MD;  [facility];  Service: Cardiovascular;  Laterality: N/A;  . LEFT HEART CATH AND CORONARY ANGIOGRAPHY N/A 06/04/2020   Procedure: LEFT HEART CATH AND CORONARY ANGIOGRAPHY;  Surgeon: Corky CraftsVaranasi, Jayadeep S, MD;  [facility];  Service: Cardiovascular;  Laterality: N/A;  . TEMPORARY PACEMAKER N/A 07/11/2019   Procedure: TEMPORARY PACEMAKER;  Surgeon: SwazilandJordan, Peter M, MD;  [facility];  Service: Cardiovascular;  Laterality: N/A;  . VAGINAL HYSTERECTOMY  1973   AUB, fibroids     Current Outpatient Medications  Medication Sig Dispense Refill  . acetaminophen (TYLENOL) 500 MG tablet Take 1,000 mg by mouth every 6 (six) hours as needed for mild pain or headache.     Marland Kitchen. aspirin EC 81 MG EC tablet Take 1 tablet (81 mg total) by mouth daily. 90 tablet 3  . Cyanocobalamin (VITAMIN B-12) 5000 MCG TBDP Take 5,000 mcg by mouth daily.     . Evolocumab (REPATHA SURECLICK) 140 MG/ML SOAJ Inject 1 Dose into the skin every 14 (fourteen) days. 6 mL 3  . ezetimibe (ZETIA) 10 MG tablet Take 1 tablet (10 mg total) by mouth daily. 90 tablet 3  . nitroGLYCERIN (NITROSTAT) 0.4 MG SL tablet Place 1 tablet (0.4 mg total) under the tongue every 5 (five) minutes as needed for chest pain (up to 3 doses. If taking 3rd dose call 911). 25 tablet 3  . Omega-3 Fatty Acids (EQL OMEGA 3 FISH OIL) 1200 MG CAPS Take 1,200 mg by mouth daily.    . pantoprazole  (PROTONIX) 40 MG tablet Take 1 tablet (40 mg total) by mouth daily. 30 tablet 6  . simvastatin (ZOCOR) 40 MG tablet Take 1 tablet (40 mg total) by mouth daily. 90 tablet 3  . ticagrelor (BRILINTA) 90 MG TABS tablet Take 1 tablet (90 mg total) by mouth 2 (two) times daily. 180 tablet 3  . escitalopram (LEXAPRO) 10 MG tablet Take 1 tablet (10 mg total) by mouth daily. 30 tablet 1  . metoprolol tartrate (LOPRESSOR) 25 MG tablet Take 0.5 tablets (12.5 mg total) by mouth 2 (two) times daily. 90 tablet 3   No current facility-administered medications for this visit.    Allergies:   Atorvastatin, Codeine, Erythromycin base, Penicillin g, and Rosuvastatin    Social History:  The patient  reports that she has quit smoking. Her smoking use included cigarettes. She has a 7.50 pack-year smoking history. She has never used smokeless tobacco. She reports that she does not drink alcohol and does not use  drugs.   Family History:  The patient's family history includes COPD in her brother; Colon cancer (age of onset: 13) in her brother and sister; Colon polyps in her brother and sister; Heart attack in her sister; Heart attack (age of onset: 64) in her brother; Heart attack (age of onset: 71) in her father; Heart disease in her brother, father, sister, and sister; Heart failure in her brother; Lung cancer in her brother.   ROS:  General:no colds or fevers, no weight changes Skin:no rashes or ulcers HEENT:no blurred vision, no congestion CV:see HPI PUL:see HPI GI:no diarrhea constipation or melena, no indigestion GU:no hematuria, no dysuria MS:no joint pain, no claudication Neuro:no syncope, no lightheadedness Endo:no diabetes, no thyroid disease  Wt Readings from Last 3 Encounters:  06/21/20 139 lb 6.4 oz (63.2 kg)  06/12/20 143 lb (64.9 kg)  06/05/20 142 lb 3.2 oz (64.5 kg)     PHYSICAL EXAM: VS:  BP 126/78   Pulse (!) 58   Ht 5\' 3"  (1.6 m)   Wt 139 lb 6.4 oz (63.2 kg)   BMI 24.69 kg/m  , BMI  Body mass index is 24.69 kg/m. General:Pleasant affect, NAD Skin:Warm and dry, brisk capillary refill HEENT:normocephalic, sclera clear, mucus membranes moist Neck:supple, no JVD, no bruits  Heart:S1S2 RRR without murmur, gallup, rub or click Lungs:clear without rales, rhonchi, or wheezes , non tender, + BS, do not palpate liver spleen or masses Ext:no lower ext edema, 2+ pedal pulses, 2+ radial pulses Neuro:alert and oriented, MAE, follows commands, + facial symmetry    EKG:  EKG is NOT ordered today.    Recent Labs: 06/03/2020: ALT 13; Magnesium 2.2 06/05/2020: BUN 13; Creatinine, Ser 1.03; Hemoglobin 9.0; Platelets 274; Potassium 4.2; Sodium 138    Lipid Panel    Component Value Date/Time   CHOL 175 05/20/2020 0928   TRIG 130 05/20/2020 0928   HDL 55 05/20/2020 0928   CHOLHDL 3.2 05/20/2020 0928   CHOLHDL 2.4 07/12/2019 0244   VLDL 14 07/12/2019 0244   LDLCALC 97 05/20/2020 0928       Other studies Reviewed: Additional studies/ records that were reviewed today include:   Cardiac cath 06/04/20  .Prox RCA to Mid RCA lesion is 99% stenosed. Mid RCA lesion is 95% stenosed.  Scoring balloon angioplasty was performed using a BALLOON WOLVERINE 3.00X10, followed by a 3.5 Frankclay balloon, on both lesions.  Post intervention, there is a 0% residual stenosis.  The left ventricular systolic function is normal.  LV end diastolic pressure is normal.  The left ventricular ejection fraction is 55-65% by visual estimate.  There is no aortic valve stenosis.   Continue aggressive secondary prevention.  Patient with some residual pain, likely related to some decreased flow in marginal branches.         ASSESSMENT AND PLAN:  1.  CAD with recent in stent restenosis and cutting balloon angioplasty - overall feels better but still with fatigue and some dyspnea.  Will change lopressor from 25 once a day to 12.5 BID.  continue Brilinta and ASA. Statin.  2.   Situational depression with freq crying does not yet have PCP will add lexapro 10 mg daily, informed it may take 2 weeks to feel better.  Gave 1 month until she may obtain PCP and they can take over this care.   3.  HLD will check statin in several weeks with Dr. 06/06/20 when she see him back.  Goal LDL < 70  4.  Hx of  cardiogenic shock with MI in Dec 2020 with stents to RCA   5.  HTN controlled.    6  Anemia will recheck   Follow up in 2-3 weeks with Dr. Swaziland or APP for lexapro follow up and eval energy       Current medicines are reviewed with the patient today.  The patient Has no concerns regarding medicines.  The following changes have been made:  See above Labs/ tests ordered today include:see above  Disposition:   FU:  see above  Signed, Nada Boozer, NP  06/21/2020 11:44 AM    [facility] [address] [place], [place], [place]  [address]/ [address] [place], [place] Phone: [phone]; Fax: [phone]  [phone]

## 2020-06-21 ENCOUNTER — Ambulatory Visit: Payer: Medicare Other | Admitting: Cardiology

## 2020-06-21 ENCOUNTER — Encounter: Payer: Self-pay | Admitting: Cardiology

## 2020-06-21 ENCOUNTER — Other Ambulatory Visit: Payer: Self-pay

## 2020-06-21 VITALS — BP 126/78 | HR 58 | Ht 63.0 in | Wt 139.4 lb

## 2020-06-21 DIAGNOSIS — R0609 Other forms of dyspnea: Secondary | ICD-10-CM

## 2020-06-21 DIAGNOSIS — I2510 Atherosclerotic heart disease of native coronary artery without angina pectoris: Secondary | ICD-10-CM

## 2020-06-21 DIAGNOSIS — E785 Hyperlipidemia, unspecified: Secondary | ICD-10-CM | POA: Diagnosis not present

## 2020-06-21 DIAGNOSIS — R0600 Dyspnea, unspecified: Secondary | ICD-10-CM

## 2020-06-21 DIAGNOSIS — E782 Mixed hyperlipidemia: Secondary | ICD-10-CM

## 2020-06-21 DIAGNOSIS — D649 Anemia, unspecified: Secondary | ICD-10-CM

## 2020-06-21 MED ORDER — NITROGLYCERIN 0.4 MG SL SUBL: 0.4000 mg | SUBLINGUAL_TABLET | SUBLINGUAL | 3 refills | Status: DC | PRN

## 2020-06-21 MED ORDER — METOPROLOL TARTRATE 25 MG PO TABS: 12.5000 mg | ORAL_TABLET | Freq: Two times a day (BID) | ORAL | 3 refills | Status: DC

## 2020-06-21 MED ORDER — ESCITALOPRAM OXALATE 10 MG PO TABS: 10.0000 mg | ORAL_TABLET | Freq: Every day | ORAL | 1 refills | Status: DC

## 2020-06-21 NOTE — Patient Instructions (Addendum)
Medication Instructions:  Your physician has recommended you make the following change in your medication:   1.  START Lexapro 10 mg taking 1 daily.. See your primary care physician for additional dosing2 2.  CHANGE the Lopressor to 1/2 tablet twice a day    *If you need a refill on your cardiac medications before your next appointment, please call your pharmacy*   Lab Work: TODAY:  BMET & CBC  If you have labs (blood work) drawn today and your tests are completely normal, you will receive your results only by: Marland Kitchen MyChart Message (if you have MyChart) OR . A paper copy in the mail If you have any lab test that is abnormal or we need to change your treatment, we will call you to review the results.   Testing/Procedures: None ordered   Follow-Up: At [facility], you and your health needs are our priority.  As part of our continuing mission to provide you with exceptional heart care, we have created designated Provider Care Teams.  These Care Teams include your primary Cardiologist (physician) and Advanced Practice Providers (APPs -  Physician Assistants and Nurse Practitioners) who all work together to provide you with the care you need, when you need it.  We recommend signing up for the patient portal called "MyChart".  Sign up information is provided on this After Visit Summary.  MyChart is used to connect with patients for Virtual Visits (Telemedicine).  Patients are able to view lab/test results, encounter notes, upcoming appointments, etc.  Non-urgent messages can be sent to your provider as well.   To learn more about what you can do with MyChart, go to [email or web address].    Your next appointment:   2 Weeks  The format for your next appointment:   In Person  Provider:   Dr. Swaziland or APP  Other Instructions

## 2020-06-22 LAB — CBC
Hematocrit: 30.6 % — ABNORMAL LOW (ref 34.0–46.6)
Hemoglobin: 9.8 g/dL — ABNORMAL LOW (ref 11.1–15.9)
MCH: 26.7 pg (ref 26.6–33.0)
MCHC: 32 g/dL (ref 31.5–35.7)
MCV: 83 fL (ref 79–97)
Platelets: 452 10*3/uL — ABNORMAL HIGH (ref 150–450)
RBC: 3.67 x10E6/uL — ABNORMAL LOW (ref 3.77–5.28)
RDW: 14 % (ref 11.7–15.4)
WBC: 6.2 10*3/uL (ref 3.4–10.8)

## 2020-06-22 LAB — BASIC METABOLIC PANEL
BUN/Creatinine Ratio: 13 (ref 12–28)
BUN: 13 mg/dL (ref 8–27)
CO2: 20 mmol/L (ref 20–29)
Calcium: 9.5 mg/dL (ref 8.7–10.3)
Chloride: 107 mmol/L — ABNORMAL HIGH (ref 96–106)
Creatinine, Ser: 1 mg/dL (ref 0.57–1.00)
GFR calc Af Amer: 65 mL/min/{1.73_m2} (ref >59)
GFR calc non Af Amer: 56 mL/min/{1.73_m2} — ABNORMAL LOW (ref >59)
Glucose: 89 mg/dL (ref 65–99)
Potassium: 4.9 mmol/L (ref 3.5–5.2)
Sodium: 142 mmol/L (ref 134–144)

## 2020-06-24 ENCOUNTER — Telehealth (HOSPITAL_COMMUNITY): Payer: Self-pay

## 2020-06-24 ENCOUNTER — Telehealth: Payer: Self-pay | Admitting: Cardiology

## 2020-06-24 NOTE — Telephone Encounter (Signed)
-----   Message from Leone Brand, NP sent at 06/23/2020  8:52 PM EST ----- Kidney function is stable.  Her hgb is improving but lower than normal.  If she has not gotten a PCP by next visit she will need repeat CBC and anemia panel.

## 2020-06-24 NOTE — Telephone Encounter (Signed)
Faxed cardiac rehab referral to [facility].

## 2020-06-24 NOTE — Telephone Encounter (Signed)
Patient returning call for lab results. 

## 2020-06-24 NOTE — Telephone Encounter (Signed)
Returned call to pt and she has been made aware of her lab results. See result note.  

## 2020-07-08 NOTE — Progress Notes (Signed)
Cardiology Office Note   Date:  07/12/2020   ID:  Sarah Pacheco, DOB [birth year], MRN [id number]  PCP:  Patient, No Pcp Per  Cardiologist:  Dr. Swaziland    Chief Complaint  Patient presents with  . Coronary Artery Disease      History of Present Illness: Sarah Pacheco is a 74 y.o. female who presents for follow up of CAD.   PMH of CAD s/p inferior STEMI complicated by VF arrest(during cath)and cardiogenic shock in 07/2019 requiring DES x 2. Other h/o includes HL, GERD, anxiety, depression, and OA. Echo at the time of her MI showed nl LV fxn w/o rwma.   In October shebegan to experience intermittent substernal chest discomfort that was most notable while lying down and also with activity. When occurring at rest, symptoms did seem to improve with sitting up or sublingual nitroglycerin. Exertional symptoms eased with rest. he presented to the emergency department October 23 where troponins were normal x2. ECG showed subtle inferolateral ST depression. She was pain-free in the emergency department and was subsequently discharged home with plan for outpatient cardiology follow-up. Unfortunately,  she  continued to have intermittent rest and exertional substernal chest heaviness associated with occasional diaphoresis as well as radiation to the shoulders and down bilateral arms to the fingers. Symptoms are very reminiscent of angina prior to MI.  She was admitted and underwent cath.  Cardiac cath showed severe in stent restenosis of the RCA. She had scoring balloon PTCA. This yielded and excellent result angiographically and by IVUS.  Ef 55-65%  Continue aggressive secondary prevention. She has since had follow up in lipid clinic with Dr Rennis Golden and started on PCSK 9 inhibitor- Repatha. She was also started on lexapro for symptoms of depression.  On follow up today she is doing very well. Notes the lexapro has made a world of difference in her depression. No anginal symptoms.  Occasionally feels a rush in the morning when she gets up but no pain.     Past Medical History:  Diagnosis Date  . Anxiety   . Arthritis   . CAD (coronary artery disease)    a. 07/2019 Inf STEMI/VF Arrest/CGS/PCI: LM nl, LADmin irregs, LCX nl, RCA 100p/m (2.75x38 & 2.75x26 Resolute Onyx DESs).  . Cataract   . Chest pain   . Depression   . GERD (gastroesophageal reflux disease)   . History of echocardiogram    a. 07/2019 Echo: EF 60-65%, no rwma, nl RV size/fxn, mildly dil LA, mild MR.  Marland Kitchen Hyperlipidemia   . Hyperlipidemia with target LDL less than 70 10/25/2015    Past Surgical History:  Procedure Laterality Date  . APPENDECTOMY    . CATARACT EXTRACTION, BILATERAL    . COLONOSCOPY  2017   X2     colon polyps  . CORONARY BALLOON ANGIOPLASTY N/A 06/04/2020   Procedure: CORONARY BALLOON ANGIOPLASTY;  Surgeon: Corky Crafts, MD;  Location: [facility] INVASIVE CV LAB;  Service: Cardiovascular;  Laterality: N/A;  . CORONARY/GRAFT ACUTE MI REVASCULARIZATION N/A 07/11/2019   Procedure: CORONARY/GRAFT ACUTE MI REVASCULARIZATION;  Surgeon: Swaziland, Alyxis Grippi M, MD;  Location: [facility] INVASIVE CV LAB;  Service: Cardiovascular;  Laterality: N/A;  . INTRAVASCULAR ULTRASOUND/IVUS N/A 06/04/2020   Procedure: Intravascular Ultrasound/IVUS;  Surgeon: Corky Crafts, MD;  Location: [facility] INVASIVE CV LAB;  Service: Cardiovascular;  Laterality: N/A;  . LAPAROTOMY N/A 10/10/2018   Procedure: EXPLORATORY LAPAROTOMY RIGHT COLECTOMY;  Surgeon: Emelia Loron, MD;  Location: [facility] ORS;  Service: General;  Laterality:  N/A;  . LEFT HEART CATH AND CORONARY ANGIOGRAPHY N/A 07/11/2019   Procedure: LEFT HEART CATH AND CORONARY ANGIOGRAPHY;  Surgeon: Swaziland, Semaj Kham M, MD;  Location: [facility] INVASIVE CV LAB;  Service: Cardiovascular;  Laterality: N/A;  . LEFT HEART CATH AND CORONARY ANGIOGRAPHY N/A 06/04/2020   Procedure: LEFT HEART CATH AND CORONARY ANGIOGRAPHY;  Surgeon: Corky Crafts, MD;  Location: [facility] INVASIVE CV LAB;   Service: Cardiovascular;  Laterality: N/A;  . TEMPORARY PACEMAKER N/A 07/11/2019   Procedure: TEMPORARY PACEMAKER;  Surgeon: Swaziland, Analyce Tavares M, MD;  Location: [facility] INVASIVE CV LAB;  Service: Cardiovascular;  Laterality: N/A;  . VAGINAL HYSTERECTOMY  1973   AUB, fibroids     Current Outpatient Medications  Medication Sig Dispense Refill  . acetaminophen (TYLENOL) 500 MG tablet Take 1,000 mg by mouth every 6 (six) hours as needed for mild pain or headache.     Marland Kitchen aspirin EC 81 MG EC tablet Take 1 tablet (81 mg total) by mouth daily. 90 tablet 3  . Cyanocobalamin (VITAMIN B-12) 5000 MCG TBDP Take 5,000 mcg by mouth daily.     Marland Kitchen escitalopram (LEXAPRO) 10 MG tablet Take 1 tablet (10 mg total) by mouth daily. 90 tablet 3  . Evolocumab (REPATHA SURECLICK) 140 MG/ML SOAJ Inject 1 Dose into the skin every 14 (fourteen) days. 6 mL 3  . ezetimibe (ZETIA) 10 MG tablet Take 1 tablet (10 mg total) by mouth daily. 90 tablet 3  . metoprolol tartrate (LOPRESSOR) 25 MG tablet Take 0.5 tablets (12.5 mg total) by mouth 2 (two) times daily. 90 tablet 3  . nitroGLYCERIN (NITROSTAT) 0.4 MG SL tablet Place 1 tablet (0.4 mg total) under the tongue every 5 (five) minutes as needed for chest pain (up to 3 doses. If taking 3rd dose call 911). 25 tablet 3  . Omega-3 Fatty Acids (EQL OMEGA 3 FISH OIL) 1200 MG CAPS Take 1,200 mg by mouth daily.    . pantoprazole (PROTONIX) 40 MG tablet Take 1 tablet (40 mg total) by mouth daily. 30 tablet 6  . simvastatin (ZOCOR) 40 MG tablet Take 1 tablet (40 mg total) by mouth daily. 90 tablet 3  . ticagrelor (BRILINTA) 90 MG TABS tablet Take 1 tablet (90 mg total) by mouth 2 (two) times daily. 180 tablet 3   No current facility-administered medications for this visit.    Allergies:   Atorvastatin, Codeine, Erythromycin base, Penicillin g, and Rosuvastatin    Social History:  The patient  reports that she has quit smoking. Her smoking use included cigarettes. She has a 7.50 pack-year  smoking history. She has never used smokeless tobacco. She reports that she does not drink alcohol and does not use drugs.   Family History:  The patient's family history includes COPD in her brother; Colon cancer (age of onset: 85) in her brother and sister; Colon polyps in her brother and sister; Heart attack in her sister; Heart attack (age of onset: 71) in her brother; Heart attack (age of onset: 43) in her father; Heart disease in her brother, father, sister, and sister; Heart failure in her brother; Lung cancer in her brother.   ROS:  As noted in HPI. All other systems are reviewed and are negative.   Wt Readings from Last 3 Encounters:  07/12/20 141 lb 3.2 oz (64 kg)  06/21/20 139 lb 6.4 oz (63.2 kg)  06/12/20 143 lb (64.9 kg)     PHYSICAL EXAM: VS:  BP 130/73   Pulse 61   Temp Marland Kitchen)  97.5 F (36.4 C)   Ht 5\' 3"  (1.6 m)   Wt 141 lb 3.2 oz (64 kg)   SpO2 94%   BMI 25.01 kg/m  , BMI Body mass index is 25.01 kg/m. General:Pleasant affect, NAD Skin:Warm and dry, brisk capillary refill HEENT:normocephalic, sclera clear, mucus membranes moist Neck:supple, no JVD, no bruits  Heart:S1S2 RRR without murmur, gallup, rub or click Lungs:clear without rales, rhonchi, or wheezes , non tender, + BS, do not palpate liver spleen or masses Ext:no lower ext edema, 2+ pedal pulses, 2+ radial pulses Neuro:alert and oriented, MAE, follows commands, + facial symmetry    EKG:  EKG is NOT ordered today.    Recent Labs: 06/03/2020: ALT 13; Magnesium 2.2 06/21/2020: BUN 13; Creatinine, Ser 1.00; Hemoglobin 9.8; Platelets 452; Potassium 4.9; Sodium 142    Lipid Panel    Component Value Date/Time   CHOL 175 05/20/2020 0928   TRIG 130 05/20/2020 0928   HDL 55 05/20/2020 0928   CHOLHDL 3.2 05/20/2020 0928   CHOLHDL 2.4 07/12/2019 0244   VLDL 14 07/12/2019 0244   LDLCALC 97 05/20/2020 0928       Other studies Reviewed: Additional studies/ records that were reviewed today  include:   Cardiac cath 06/04/20  .Prox RCA to Mid RCA lesion is 99% stenosed. Mid RCA lesion is 95% stenosed.  Scoring balloon angioplasty was performed using a BALLOON WOLVERINE 3.00X10, followed by a 3.5  balloon, on both lesions.  Post intervention, there is a 0% residual stenosis.  The left ventricular systolic function is normal.  LV end diastolic pressure is normal.  The left ventricular ejection fraction is 55-65% by visual estimate.  There is no aortic valve stenosis.   Continue aggressive secondary prevention.  Patient with some residual pain, likely related to some decreased flow in marginal branches.         ASSESSMENT AND PLAN:  1.  CAD s/p stenting of the RCA in 2020 for acute inferior MI with VF arrest and shock. Returned in October with USAP. Had instent restenosis treated with scoring balloon angioplasty. with recent in stent restenosis and cutting balloon angioplasty - Clinically doing well.   continue Brilinta and ASA. Statin.  2.  Situational depression- markedly improved on lexapro. She would like to continue this.   3.  HLD  Intolerant to high dose statin. Not at goal on simvastatin and Zetia. Now on Repatha. Plan repeat lipids in January. Followed in lipid clinic.  4.  Hx of cardiogenic shock with MI in Dec 2020 with stents to RCA   5.  HTN controlled.     Follow up in 4 months.      Current medicines are reviewed with the patient today.  The patient Has no concerns regarding medicines.  The following changes have been made:  See above Labs/ tests ordered today include:see above  Disposition:   FU:  see above  Signed, Eagan Shifflett Jan 2021, MD  07/12/2020 4:20 PM    [facility] [address] [place], [place], [place]  [place] [address]/ [address], [place] Phone: [phone]; Fax: [phone]  [phone]

## 2020-07-12 ENCOUNTER — Other Ambulatory Visit: Payer: Self-pay

## 2020-07-12 ENCOUNTER — Ambulatory Visit: Payer: Medicare Other | Admitting: Cardiology

## 2020-07-12 ENCOUNTER — Encounter: Payer: Self-pay | Admitting: Cardiology

## 2020-07-12 VITALS — BP 130/73 | HR 61 | Temp 97.5°F | Ht 63.0 in | Wt 141.2 lb

## 2020-07-12 DIAGNOSIS — I2510 Atherosclerotic heart disease of native coronary artery without angina pectoris: Secondary | ICD-10-CM

## 2020-07-12 DIAGNOSIS — E785 Hyperlipidemia, unspecified: Secondary | ICD-10-CM

## 2020-07-12 DIAGNOSIS — F325 Major depressive disorder, single episode, in full remission: Secondary | ICD-10-CM | POA: Diagnosis not present

## 2020-07-12 MED ORDER — SIMVASTATIN 40 MG PO TABS
40.0000 mg | ORAL_TABLET | Freq: Every day | ORAL | 3 refills | Status: DC
Start: 2020-07-12 — End: 2021-07-07

## 2020-07-12 MED ORDER — ESCITALOPRAM OXALATE 10 MG PO TABS
10.0000 mg | ORAL_TABLET | Freq: Every day | ORAL | 3 refills | Status: DC
Start: 2020-07-12 — End: 2021-07-07

## 2020-08-27 ENCOUNTER — Emergency Department (HOSPITAL_COMMUNITY): Payer: Medicare Other

## 2020-08-27 ENCOUNTER — Other Ambulatory Visit: Payer: Self-pay

## 2020-08-27 ENCOUNTER — Inpatient Hospital Stay (HOSPITAL_COMMUNITY)
Admission: EM | Admit: 2020-08-27 | Discharge: 2020-08-29 | DRG: 247 | Disposition: A | Discharge status: Home / Self Care | Payer: Medicare Other | Attending: Internal Medicine | Admitting: Internal Medicine

## 2020-08-27 ENCOUNTER — Encounter (HOSPITAL_COMMUNITY): Payer: Self-pay | Admitting: Physician Assistant

## 2020-08-27 DIAGNOSIS — I252 Old myocardial infarction: Secondary | ICD-10-CM

## 2020-08-27 DIAGNOSIS — E7800 Pure hypercholesterolemia, unspecified: Secondary | ICD-10-CM | POA: Diagnosis present

## 2020-08-27 DIAGNOSIS — Z8371 Family history of colonic polyps: Secondary | ICD-10-CM | POA: Diagnosis not present

## 2020-08-27 DIAGNOSIS — I2510 Atherosclerotic heart disease of native coronary artery without angina pectoris: Secondary | ICD-10-CM | POA: Diagnosis not present

## 2020-08-27 DIAGNOSIS — Z8249 Family history of ischemic heart disease and other diseases of the circulatory system: Secondary | ICD-10-CM

## 2020-08-27 DIAGNOSIS — F32A Depression, unspecified: Secondary | ICD-10-CM | POA: Diagnosis present

## 2020-08-27 DIAGNOSIS — I25110 Atherosclerotic heart disease of native coronary artery with unstable angina pectoris: Secondary | ICD-10-CM | POA: Diagnosis present

## 2020-08-27 DIAGNOSIS — Z8674 Personal history of sudden cardiac arrest: Secondary | ICD-10-CM | POA: Diagnosis not present

## 2020-08-27 DIAGNOSIS — Z7982 Long term (current) use of aspirin: Secondary | ICD-10-CM | POA: Diagnosis not present

## 2020-08-27 DIAGNOSIS — I200 Unstable angina: Secondary | ICD-10-CM

## 2020-08-27 DIAGNOSIS — Z955 Presence of coronary angioplasty implant and graft: Secondary | ICD-10-CM

## 2020-08-27 DIAGNOSIS — Z79899 Other long term (current) drug therapy: Secondary | ICD-10-CM

## 2020-08-27 DIAGNOSIS — F419 Anxiety disorder, unspecified: Secondary | ICD-10-CM | POA: Diagnosis present

## 2020-08-27 DIAGNOSIS — T82855D Stenosis of coronary artery stent, subsequent encounter: Secondary | ICD-10-CM | POA: Diagnosis not present

## 2020-08-27 DIAGNOSIS — Z9861 Coronary angioplasty status: Secondary | ICD-10-CM | POA: Diagnosis not present

## 2020-08-27 DIAGNOSIS — I10 Essential (primary) hypertension: Secondary | ICD-10-CM | POA: Diagnosis present

## 2020-08-27 DIAGNOSIS — Z825 Family history of asthma and other chronic lower respiratory diseases: Secondary | ICD-10-CM | POA: Diagnosis not present

## 2020-08-27 DIAGNOSIS — E785 Hyperlipidemia, unspecified: Secondary | ICD-10-CM | POA: Diagnosis present

## 2020-08-27 DIAGNOSIS — Z20822 Contact with and (suspected) exposure to covid-19: Secondary | ICD-10-CM | POA: Diagnosis present

## 2020-08-27 DIAGNOSIS — Z8719 Personal history of other diseases of the digestive system: Secondary | ICD-10-CM

## 2020-08-27 DIAGNOSIS — K219 Gastro-esophageal reflux disease without esophagitis: Secondary | ICD-10-CM | POA: Diagnosis present

## 2020-08-27 DIAGNOSIS — R7303 Prediabetes: Secondary | ICD-10-CM | POA: Diagnosis present

## 2020-08-27 DIAGNOSIS — D649 Anemia, unspecified: Secondary | ICD-10-CM | POA: Diagnosis present

## 2020-08-27 DIAGNOSIS — T82855A Stenosis of coronary artery stent, initial encounter: Secondary | ICD-10-CM

## 2020-08-27 LAB — CBC
HCT: 36.2 % (ref 36.0–46.0)
Hemoglobin: 10.9 g/dL — ABNORMAL LOW (ref 12.0–15.0)
MCH: 25.5 pg — ABNORMAL LOW (ref 26.0–34.0)
MCHC: 30.1 g/dL (ref 30.0–36.0)
MCV: 84.8 fL (ref 80.0–100.0)
Platelets: 307 10*3/uL (ref 150–400)
RBC: 4.27 MIL/uL (ref 3.87–5.11)
RDW: 15.1 % (ref 11.5–15.5)
WBC: 6.5 10*3/uL (ref 4.0–10.5)
nRBC: 0 % (ref 0.0–0.2)

## 2020-08-27 LAB — TROPONIN I (HIGH SENSITIVITY)
Troponin I (High Sensitivity): 9 ng/L (ref <18)
Troponin I (High Sensitivity): 9 ng/L (ref <18)
Troponin I (High Sensitivity): 8 ng/L (ref <18)

## 2020-08-27 LAB — HEPATIC FUNCTION PANEL
ALT: 13 U/L (ref 0–44)
AST: 22 U/L (ref 15–41)
Albumin: 3.6 g/dL (ref 3.5–5.0)
Alkaline Phosphatase: 60 U/L (ref 38–126)
Bilirubin, Direct: 0.1 mg/dL (ref 0.0–0.2)
Indirect Bilirubin: 0.5 mg/dL (ref 0.3–0.9)
Total Bilirubin: 0.6 mg/dL (ref 0.3–1.2)
Total Protein: 6.5 g/dL (ref 6.5–8.1)

## 2020-08-27 LAB — BASIC METABOLIC PANEL
Anion gap: 10 (ref 5–15)
BUN: 15 mg/dL (ref 8–23)
CO2: 21 mmol/L — ABNORMAL LOW (ref 22–32)
Calcium: 9 mg/dL (ref 8.9–10.3)
Chloride: 105 mmol/L (ref 98–111)
Creatinine, Ser: 1.16 mg/dL — ABNORMAL HIGH (ref 0.44–1.00)
GFR, Estimated: 50 mL/min — ABNORMAL LOW (ref >60)
Glucose, Bld: 94 mg/dL (ref 70–99)
Potassium: 4.3 mmol/L (ref 3.5–5.1)
Sodium: 136 mmol/L (ref 135–145)

## 2020-08-27 LAB — LIPID PANEL
Cholesterol: 86 mg/dL (ref 0–200)
HDL: 61 mg/dL (ref >40)
LDL Cholesterol: 19 mg/dL (ref 0–99)
Total CHOL/HDL Ratio: 1.4 RATIO
Triglycerides: 32 mg/dL (ref <150)
VLDL: 6 mg/dL (ref 0–40)

## 2020-08-27 LAB — RESP PANEL BY RT-PCR (FLU A&B, COVID) ARPGX2
Influenza A by PCR: NEGATIVE
Influenza B by PCR: NEGATIVE
SARS Coronavirus 2 by RT PCR: NEGATIVE

## 2020-08-27 LAB — TSH: TSH: 1.941 u[IU]/mL (ref 0.350–4.500)

## 2020-08-27 MED ORDER — NITROGLYCERIN 0.4 MG SL SUBL: 0.4000 mg | SUBLINGUAL_TABLET | SUBLINGUAL | Status: DC | PRN

## 2020-08-27 MED ORDER — ACETAMINOPHEN 325 MG PO TABS
650.0000 mg | ORAL_TABLET | ORAL | Status: DC | PRN
  Administered 2020-08-28 – 2020-08-29 (×3): 650 mg via ORAL
  Filled 2020-08-27 (×3): qty 2

## 2020-08-27 MED ORDER — HEPARIN (PORCINE) 25000 UT/250ML-% IV SOLN
750.0000 [IU]/h | INTRAVENOUS | Status: DC
  Administered 2020-08-27: 750 [IU]/h via INTRAVENOUS
  Filled 2020-08-27 (×2): qty 250

## 2020-08-27 MED ORDER — ASPIRIN 300 MG RE SUPP: 300.0000 mg | RECTAL | Status: AC

## 2020-08-27 MED ORDER — HEPARIN BOLUS VIA INFUSION
3800.0000 [IU] | Freq: Once | INTRAVENOUS | Status: AC
  Administered 2020-08-27: 3800 [IU] via INTRAVENOUS
  Filled 2020-08-27: qty 3800

## 2020-08-27 MED ORDER — SODIUM CHLORIDE 0.9% FLUSH: 3.0000 mL | INTRAVENOUS | Status: DC | PRN

## 2020-08-27 MED ORDER — NITROGLYCERIN 2 % TD OINT
0.5000 [in_us] | TOPICAL_OINTMENT | Freq: Four times a day (QID) | TRANSDERMAL | Status: DC
  Administered 2020-08-27 – 2020-08-29 (×3): 0.5 [in_us] via TOPICAL
  Filled 2020-08-27: qty 1
  Filled 2020-08-27: qty 30

## 2020-08-27 MED ORDER — ZOLPIDEM TARTRATE 5 MG PO TABS: 5.0000 mg | ORAL_TABLET | Freq: Every evening | ORAL | Status: DC | PRN

## 2020-08-27 MED ORDER — SODIUM CHLORIDE 0.9% FLUSH
3.0000 mL | Freq: Two times a day (BID) | INTRAVENOUS | Status: DC
  Administered 2020-08-28 (×2): 3 mL via INTRAVENOUS

## 2020-08-27 MED ORDER — ASPIRIN 81 MG PO CHEW
324.0000 mg | CHEWABLE_TABLET | ORAL | Status: AC
Start: 2020-08-27 — End: 2020-08-27
  Administered 2020-08-27: 324 mg via ORAL
  Filled 2020-08-27: qty 4

## 2020-08-27 MED ORDER — SODIUM CHLORIDE 0.9 % IV SOLN: 250.0000 mL | INTRAVENOUS | Status: DC | PRN

## 2020-08-27 MED ORDER — ONDANSETRON HCL 4 MG/2ML IJ SOLN: 4.0000 mg | Freq: Four times a day (QID) | INTRAMUSCULAR | Status: DC | PRN

## 2020-08-27 MED ORDER — ALPRAZOLAM 0.25 MG PO TABS: 0.2500 mg | ORAL_TABLET | Freq: Two times a day (BID) | ORAL | Status: DC | PRN

## 2020-08-27 NOTE — ED Provider Notes (Incomplete)
[facility] EMERGENCY DEPARTMENT Provider Note   CSN: [id number] Arrival date & time: 08/27/20  1245     History No chief complaint on file.   Sarah Pacheco is a 75 y.o. female with a past medical history significant for CAD status post inferior STEMI complicated by VF arrest (during cath) and cardiogenic shock in 07/2019 requiring DES x2, hyperlipidemia, GERD, anxiety, depression, and obstructive sleep apnea who presents to the ED due to intermittent chest pain that is worsened over the past week.  Patient states she has had ongoing chest pain ever since leaving the hospital in October 2021 however, chest pain has become severe over the past week.  Patient describes chest pain as a burning and pressure sensation that radiates down bilateral arms.  She notes pain is similar to her past MI.  Chest pain relieved by nitroglycerin. She is followed by Dr. Swaziland with [facility]. Chest pain worse with exertion.  Denies recent illness. No relationship to positional changes.  No fever or chills.  Denies associated nausea, vomiting, diaphoresis.  She admits to chronic shortness of breath not worse with chest pain.  Denies history of blood clots, recent surgeries, recent long immobilizations, and hormonal treatments.  Denies lower extremity edema.  Chart reviewed. In October 2021, patient had a cardiac cath which demonstrates severe stent restenosis of the RCA which required balloon PTCA.   History obtained from patient and past medical records. No interpreter used during encounter.      Past Medical History:  Diagnosis Date  . Anxiety   . Arthritis   . CAD (coronary artery disease)    a. 07/2019 Inf STEMI/VF Arrest/CGS/PCI: LM nl, LADmin irregs, LCX nl, RCA 100p/m (2.75x38 & 2.75x26 Resolute Onyx DESs).  . Cataract   . Chest pain   . Depression   . GERD (gastroesophageal reflux disease)   . History of echocardiogram    a. 07/2019 Echo: EF 60-65%, no rwma, nl RV  size/fxn, mildly dil LA, mild MR.  Marland Kitchen Hyperlipidemia   . Hyperlipidemia with target LDL less than 70 10/25/2015    Patient Active Problem List   Diagnosis Date Noted  . Unstable angina (HCC) 06/03/2020  . CAD in native artery 07/13/2019  . Cardiogenic shock (HCC) 07/13/2019  . Bradycardia 07/13/2019  . Mild anemia 07/13/2019  . Hypokalemia 07/13/2019  . Pre-diabetes 07/13/2019  . AKI (acute kidney injury) (HCC) 07/13/2019  . Non-ST elevation (NSTEMI) myocardial infarction (HCC) 07/11/2019  . Ileus, postoperative (HCC) 10/21/2018  . Postoperative ileus (HCC) 10/21/2018  . Cecal volvulus (HCC) 10/10/2018  . Hyperlipidemia with target LDL less than 70 10/25/2015    Past Surgical History:  Procedure Laterality Date  . APPENDECTOMY    . CATARACT EXTRACTION, BILATERAL    . COLONOSCOPY  2017   X2     colon polyps  . CORONARY BALLOON ANGIOPLASTY N/A 06/04/2020   Procedure: CORONARY BALLOON ANGIOPLASTY;  Surgeon: Corky Crafts, MD;  Location: [facility] INVASIVE CV LAB;  Service: Cardiovascular;  Laterality: N/A;  . CORONARY/GRAFT ACUTE MI REVASCULARIZATION N/A 07/11/2019   Procedure: CORONARY/GRAFT ACUTE MI REVASCULARIZATION;  Surgeon: Swaziland, Peter M, MD;  Location: [facility] INVASIVE CV LAB;  Service: Cardiovascular;  Laterality: N/A;  . INTRAVASCULAR ULTRASOUND/IVUS N/A 06/04/2020   Procedure: Intravascular Ultrasound/IVUS;  Surgeon: Corky Crafts, MD;  Location: [facility] INVASIVE CV LAB;  Service: Cardiovascular;  Laterality: N/A;  . LAPAROTOMY N/A 10/10/2018   Procedure: EXPLORATORY LAPAROTOMY RIGHT COLECTOMY;  Surgeon: Emelia Loron, MD;  Location: [facility];  Service: General;  Laterality: N/A;  . LEFT HEART CATH AND CORONARY ANGIOGRAPHY N/A 07/11/2019   Procedure: LEFT HEART CATH AND CORONARY ANGIOGRAPHY;  Surgeon: Swaziland, Peter M, MD;  Location: [facility] INVASIVE CV LAB;  Service: Cardiovascular;  Laterality: N/A;  . LEFT HEART CATH AND CORONARY ANGIOGRAPHY N/A 06/04/2020   Procedure: LEFT  HEART CATH AND CORONARY ANGIOGRAPHY;  Surgeon: Corky Crafts, MD;  Location: [facility] INVASIVE CV LAB;  Service: Cardiovascular;  Laterality: N/A;  . TEMPORARY PACEMAKER N/A 07/11/2019   Procedure: TEMPORARY PACEMAKER;  Surgeon: Swaziland, Peter M, MD;  Location: [facility] INVASIVE CV LAB;  Service: Cardiovascular;  Laterality: N/A;  . VAGINAL HYSTERECTOMY  1973   AUB, fibroids     OB History    Gravida  2   Para  2   Term      Preterm      AB      Living  2     SAB      IAB      Ectopic      Multiple      Live Births  2           Family History  Problem Relation Age of Onset  . Heart attack Father 32       MI  . Heart disease Father   . Heart disease Sister   . Heart attack Brother 59       MI  . Heart attack Sister   . Heart disease Sister   . Colon cancer Sister 34  . Colon polyps Sister   . Heart disease Brother   . Heart failure Brother   . Colon cancer Brother 55  . Colon polyps Brother   . COPD Brother   . Lung cancer Brother   . Esophageal cancer Neg Hx   . Rectal cancer Neg Hx   . Stomach cancer Neg Hx     Social History   Tobacco Use  . Smoking status: Former Smoker    Packs/day: 0.50    Years: 15.00    Pack years: 7.50    Types: Cigarettes  . Smokeless tobacco: Never Used  . Tobacco comment: Quit around age 34  Vaping Use  . Vaping Use: Never used  Substance Use Topics  . Alcohol use: No  . Drug use: No    Home Medications Prior to Admission medications   Medication Sig Start Date End Date Taking? Authorizing Provider  acetaminophen (TYLENOL) 500 MG tablet Take 1,000 mg by mouth every 6 (six) hours as needed for mild pain or headache.     [provider]  aspirin EC 81 MG EC tablet Take 1 tablet (81 mg total) by mouth daily. 07/14/19   Dunn, Tacey Ruiz, PA-C  Cyanocobalamin (VITAMIN B-12) 5000 MCG TBDP Take 5,000 mcg by mouth daily.     [provider]  escitalopram (LEXAPRO) 10 MG tablet Take 1 tablet (10 mg total) by  mouth daily. 07/12/20   Swaziland, Peter M, MD  Evolocumab (REPATHA SURECLICK) 140 MG/ML SOAJ Inject 1 Dose into the skin every 14 (fourteen) days. 06/12/20   Hilty, Lisette Abu, MD  ezetimibe (ZETIA) 10 MG tablet Take 1 tablet (10 mg total) by mouth daily. 11/14/19   Hilty, Lisette Abu, MD  metoprolol tartrate (LOPRESSOR) 25 MG tablet Take 0.5 tablets (12.5 mg total) by mouth 2 (two) times daily. 06/21/20 09/19/20  Leone Brand, NP  nitroGLYCERIN (NITROSTAT) 0.4 MG SL tablet Place 1 tablet (0.4  mg total) under the tongue every 5 (five) minutes as needed for chest pain (up to 3 doses. If taking 3rd dose call 911). 06/21/20   Leone Brand, NP  Omega-3 Fatty Acids (EQL OMEGA 3 FISH OIL) 1200 MG CAPS Take 1,200 mg by mouth daily.    [provider]  pantoprazole (PROTONIX) 40 MG tablet Take 1 tablet (40 mg total) by mouth daily. 08/16/18   Lynann Bologna, MD  simvastatin (ZOCOR) 40 MG tablet Take 1 tablet (40 mg total) by mouth daily. 07/12/20   Swaziland, Peter M, MD  ticagrelor (BRILINTA) 90 MG TABS tablet Take 1 tablet (90 mg total) by mouth 2 (two) times daily. 04/04/20   [place]aziland, Peter M, MD    Allergies    Atorvastatin, Codeine, Erythromycin base, Penicillin g, and Rosuvastatin  Review of Systems   Review of Systems  Physical Exam Updated Vital Signs BP (!) 112/57 (BP Location: Right Arm)   Pulse (!) 53   Temp 98.7 F (37.1 C) (Oral)   Resp 18   Ht 5\' 3"  (1.6 m)   Wt 63.5 kg   SpO2 100%   BMI 24.80 kg/m   Physical Exam  ED Results / Procedures / Treatments   Labs (all labs ordered are listed, but only abnormal results are displayed) Labs Reviewed  BASIC METABOLIC PANEL - Abnormal; Notable for the following components:      Result Value   CO2 21 (*)    Creatinine, Ser 1.16 (*)    GFR, Estimated 50 (*)    All other components within normal limits  CBC - Abnormal; Notable for the following components:   Hemoglobin 10.9 (*)    MCH 25.5 (*)    All other components within normal  limits  TROPONIN I (HIGH SENSITIVITY)  TROPONIN I (HIGH SENSITIVITY)    EKG None  Radiology DG Chest 2 View  Result Date: 08/27/2020 CLINICAL DATA:  Chest pain EXAM: CHEST - 2 VIEW COMPARISON:  06/01/2020 FINDINGS: The heart size and mediastinal contours are within normal limits. Both lungs are clear. The visualized skeletal structures are unremarkable. IMPRESSION: No active cardiopulmonary disease. Electronically Signed   By: 06/03/2020 M.D.   On: 08/27/2020 13:22    Procedures Procedures (including critical care time)  Medications Ordered in ED Medications - No data to display  ED Course  I have reviewed the triage vital signs and the nursing notes.  Pertinent labs & imaging results that were available during my care of the patient were reviewed by me and considered in my medical decision making (see chart for details).    MDM Rules/Calculators/A&P                          *** Final Clinical Impression(s) / ED Diagnoses Final diagnoses:  None    Rx / DC Orders ED Discharge Orders    None

## 2020-08-27 NOTE — ED Provider Notes (Signed)
[facility] EMERGENCY DEPARTMENT Provider Note   CSN: [id number] Arrival date & time: 08/27/20  1245     History No chief complaint on file.   Sarah Pacheco is a 75 y.o. female with a past medical history significant for CAD status post inferior STEMI complicated by VF arrest (during cath) and cardiogenic shock in 07/2019 requiring DES x2, hyperlipidemia, GERD, anxiety, depression, and obstructive sleep apnea who presents to the ED due to intermittent chest pain that is worsened over the past week.  Patient states she has had ongoing chest pain ever since leaving the hospital in October 2021 however, chest pain has become severe over the past week.  Patient describes chest pain as a burning and pressure sensation that radiates down bilateral arms.  She notes pain is similar to her past MI.  Chest pain relieved by nitroglycerin. She is followed by Dr. Swaziland with [facility]. Chest pain worse with exertion.  Denies recent illness. No relationship to positional changes.  No fever or chills.  Denies associated nausea, vomiting, diaphoresis.  She admits to chronic shortness of breath not worse with chest pain.  Denies history of blood clots, recent surgeries, recent long immobilizations, and hormonal treatments.  Denies lower extremity edema.  Chart reviewed. In October 2021, patient had a cardiac cath which demonstrates severe stent restenosis of the RCA which required balloon PTCA.   History obtained from patient and past medical records. No interpreter used during encounter.   HPI: A 75 year old patient with a history of hypercholesterolemia presents for evaluation of chest pain. Initial onset of pain was less than one hour ago. The patient's chest pain is worse with exertion and is relieved by nitroglycerin. The patient's chest pain is middle- or left-sided, is not well-localized, is not described as heaviness/pressure/tightness, is not sharp and does radiate to the  arms/jaw/neck. The patient does not complain of nausea and denies diaphoresis. The patient has no history of stroke, has no history of peripheral artery disease, has not smoked in the past 90 days, denies any history of treated diabetes, has no relevant family history of coronary artery disease (first degree relative at less than age 68), is not hypertensive and does not have an elevated BMI (>=30).   Past Medical History:  Diagnosis Date  . Anxiety   . Arthritis   . CAD (coronary artery disease)    a. 07/2019 Inf STEMI/VF Arrest/CGS/PCI: LM nl, LADmin irregs, LCX nl, RCA 100p/m (2.75x38 & 2.75x26 Resolute Onyx DESs).  . Cataract   . Chest pain   . Depression   . GERD (gastroesophageal reflux disease)   . History of echocardiogram    a. 07/2019 Echo: EF 60-65%, no rwma, nl RV size/fxn, mildly dil LA, mild MR.  Marland Kitchen Hyperlipidemia with target LDL less than 70 10/25/2015    Patient Active Problem List   Diagnosis Date Noted  . Unstable angina (HCC) 06/03/2020  . CAD in native artery 07/13/2019  . Cardiogenic shock (HCC) 07/13/2019  . Bradycardia 07/13/2019  . Mild anemia 07/13/2019  . Hypokalemia 07/13/2019  . Pre-diabetes 07/13/2019  . AKI (acute kidney injury) (HCC) 07/13/2019  . Non-ST elevation (NSTEMI) myocardial infarction (HCC) 07/11/2019  . Ileus, postoperative (HCC) 10/21/2018  . Postoperative ileus (HCC) 10/21/2018  . Cecal volvulus (HCC) 10/10/2018  . Hyperlipidemia with target LDL less than 70 10/25/2015    Past Surgical History:  Procedure Laterality Date  . APPENDECTOMY    . CATARACT EXTRACTION, BILATERAL    . COLONOSCOPY  2017   X2     colon polyps  . CORONARY BALLOON ANGIOPLASTY N/A 06/04/2020   Procedure: CORONARY BALLOON ANGIOPLASTY;  Surgeon: Corky CraftsVaranasi, Jayadeep S, MD;  [facility];  Service: Cardiovascular;  Laterality: N/A;  . CORONARY/GRAFT ACUTE MI REVASCULARIZATION N/A 07/11/2019   Procedure: CORONARY/GRAFT ACUTE MI REVASCULARIZATION;   Surgeon: SwazilandJordan, Peter M, MD;  [facility];  Service: Cardiovascular;  Laterality: N/A;  . INTRAVASCULAR ULTRASOUND/IVUS N/A 06/04/2020   Procedure: Intravascular Ultrasound/IVUS;  Surgeon: Corky CraftsVaranasi, Jayadeep S, MD;  [facility];  Service: Cardiovascular;  Laterality: N/A;  . LAPAROTOMY N/A 10/10/2018   Procedure: EXPLORATORY LAPAROTOMY RIGHT COLECTOMY;  Surgeon: Emelia LoronWakefield, Matthew, MD;  Location: [facility] ORS;  Service: General;  Laterality: N/A;  . LEFT HEART CATH AND CORONARY ANGIOGRAPHY N/A 07/11/2019   Procedure: LEFT HEART CATH AND CORONARY ANGIOGRAPHY;  Surgeon: SwazilandJordan, Peter M, MD;  [facility];  Service: Cardiovascular;  Laterality: N/A;  . LEFT HEART CATH AND CORONARY ANGIOGRAPHY N/A 06/04/2020   Procedure: LEFT HEART CATH AND CORONARY ANGIOGRAPHY;  Surgeon: Corky CraftsVaranasi, Jayadeep S, MD;  [facility];  Service: Cardiovascular;  Laterality: N/A;  . TEMPORARY PACEMAKER N/A 07/11/2019   Procedure: TEMPORARY PACEMAKER;  Surgeon: SwazilandJordan, Peter M, MD;  [facility];  Service: Cardiovascular;  Laterality: N/A;  . VAGINAL HYSTERECTOMY  1973   AUB, fibroids     OB History    Gravida  2   Para  2   Term      Preterm      AB      Living  2     SAB      IAB      Ectopic      Multiple      Live Births  2           Family History  Problem Relation Age of Onset  . Heart attack Father 4072       MI  . Heart disease Father   . Heart disease Sister   . Heart attack Brother 59       MI  . Heart attack Sister   . Heart disease Sister   . Colon cancer Sister 3165  . Colon polyps Sister   . Heart disease Brother   . Heart failure Brother   . Colon cancer Brother 7365  . Colon polyps Brother   . COPD Brother   . Lung cancer Brother   . Esophageal cancer Neg Hx   . Rectal cancer Neg Hx   . Stomach cancer Neg Hx     Social History   Tobacco Use  . Smoking status: Former Smoker    Packs/day: 0.50     Years: 15.00    Pack years: 7.50    Types: Cigarettes  . Smokeless tobacco: Never Used  . Tobacco comment: Quit around age 89  Vaping Use  . Vaping Use: Never used  Substance Use Topics  . Alcohol use: No  . Drug use: No    Home Medications Prior to Admission medications   Medication Sig Start Date End Date Taking? Authorizing Provider  acetaminophen (TYLENOL) 500 MG tablet Take 1,000 mg by mouth every 6 (six) hours as needed for mild pain or headache.     [provider]  aspirin EC 81 MG EC tablet Take 1 tablet (81 mg total) by mouth daily. 07/14/19   Dunn, Tacey Ruizayna N, PA-C  Cyanocobalamin (  VITAMIN B-12) 5000 MCG TBDP Take 5,000 mcg by mouth daily.     [provider]  escitalopram (LEXAPRO) 10 MG tablet Take 1 tablet (10 mg total) by mouth daily. 07/12/20   SwazilandJordan, Peter M, MD  Evolocumab (REPATHA SURECLICK) 140 MG/ML SOAJ Inject 1 Dose into the skin every 14 (fourteen) days. 06/12/20   Hilty, Lisette AbuKenneth C, MD  ezetimibe (ZETIA) 10 MG tablet Take 1 tablet (10 mg total) by mouth daily. 11/14/19   Hilty, Lisette AbuKenneth C, MD  metoprolol tartrate (LOPRESSOR) 25 MG tablet Take 0.5 tablets (12.5 mg total) by mouth 2 (two) times daily. 06/21/20 09/19/20  Leone BrandIngold, Laura R, NP  nitroGLYCERIN (NITROSTAT) 0.4 MG SL tablet Place 1 tablet (0.4 mg total) under the tongue every 5 (five) minutes as needed for chest pain (up to 3 doses. If taking 3rd dose call 911). 06/21/20   Leone BrandIngold, Laura R, NP  Omega-3 Fatty Acids (EQL OMEGA 3 FISH OIL) 1200 MG CAPS Take 1,200 mg by mouth daily.    [provider]  pantoprazole (PROTONIX) 40 MG tablet Take 1 tablet (40 mg total) by mouth daily. 08/16/18   Lynann BolognaGupta, Rajesh, MD  simvastatin (ZOCOR) 40 MG tablet Take 1 tablet (40 mg total) by mouth daily. 07/12/20   SwazilandJordan, Peter M, MD  ticagrelor (BRILINTA) 90 MG TABS tablet Take 1 tablet (90 mg total) by mouth 2 (two) times daily. 04/04/20   SwazilandJordan, Peter M, MD    Allergies    Atorvastatin, Codeine, Erythromycin  base, Penicillin g, and Rosuvastatin  Review of Systems   Review of Systems  Constitutional: Negative for chills and fever.  Respiratory: Positive for shortness of breath.   Cardiovascular: Positive for chest pain. Negative for leg swelling.  Gastrointestinal: Negative for abdominal pain, nausea and vomiting.  All other systems reviewed and are negative.   Physical Exam Updated Vital Signs BP (!) 112/57 (BP Location: Right Arm)   Pulse (!) 53   Temp 98.7 F (37.1 C) (Oral)   Resp 18   Ht 5\' 3"  (1.6 m)   Wt 63.5 kg   SpO2 100%   BMI 24.80 kg/m   Physical Exam Vitals and nursing note reviewed.  Constitutional:      General: She is not in acute distress.    Appearance: She is not ill-appearing.  HENT:     Head: Normocephalic.  Eyes:     Pupils: Pupils are equal, round, and reactive to light.  Cardiovascular:     Rate and Rhythm: Normal rate and regular rhythm.     Pulses: Normal pulses.     Heart sounds: Normal heart sounds. No murmur heard. No friction rub. No gallop.   Pulmonary:     Effort: Pulmonary effort is normal.     Breath sounds: Normal breath sounds.  Abdominal:     General: Abdomen is flat. There is no distension.     Palpations: Abdomen is soft.     Tenderness: There is no abdominal tenderness. There is no guarding or rebound.  Musculoskeletal:     Cervical back: Neck supple.     Comments: No lower extremity edema. Negative homan sign bilaterally  Skin:    General: Skin is warm and dry.  Neurological:     General: No focal deficit present.     Mental Status: She is alert.  Psychiatric:        Mood and Affect: Mood normal.        Behavior: Behavior normal.     ED  Results / Procedures / Treatments   Labs (all labs ordered are listed, but only abnormal results are displayed) Labs Reviewed  BASIC METABOLIC PANEL - Abnormal; Notable for the following components:      Result Value   CO2 21 (*)    Creatinine, Ser 1.16 (*)    GFR, Estimated 50 (*)     All other components within normal limits  CBC - Abnormal; Notable for the following components:   Hemoglobin 10.9 (*)    MCH 25.5 (*)    All other components within normal limits  HEPATIC FUNCTION PANEL  TSH  BASIC METABOLIC PANEL  LIPID PANEL  HEPARIN LEVEL (UNFRACTIONATED)  CBC  TROPONIN I (HIGH SENSITIVITY)  TROPONIN I (HIGH SENSITIVITY)    EKG None  Radiology DG Chest 2 View  Result Date: 08/27/2020 CLINICAL DATA:  Chest pain EXAM: CHEST - 2 VIEW COMPARISON:  06/01/2020 FINDINGS: The heart size and mediastinal contours are within normal limits. Both lungs are clear. The visualized skeletal structures are unremarkable. IMPRESSION: No active cardiopulmonary disease. Electronically Signed   By: Kennith Center M.D.   On: 08/27/2020 13:22    Procedures Procedures (including critical care time)  Medications Ordered in ED Medications  nitroGLYCERIN (NITROSTAT) SL tablet 0.4 mg (has no administration in time range)  acetaminophen (TYLENOL) tablet 650 mg (has no administration in time range)  ondansetron (ZOFRAN) injection 4 mg (has no administration in time range)  zolpidem (AMBIEN) tablet 5 mg (has no administration in time range)  sodium chloride flush (NS) 0.9 % injection 3 mL (has no administration in time range)  sodium chloride flush (NS) 0.9 % injection 3 mL (has no administration in time range)  0.9 %  sodium chloride infusion (has no administration in time range)  nitroGLYCERIN (NITROGLYN) 2 % ointment 0.5 inch (has no administration in time range)  ALPRAZolam (XANAX) tablet 0.25 mg (has no administration in time range)  heparin bolus via infusion 3,800 Units (has no administration in time range)  heparin ADULT infusion 100 units/mL (25000 units/256mL) (has no administration in time range)  aspirin chewable tablet 324 mg (324 mg Oral Given 08/27/20 1824)    Or  aspirin suppository 300 mg ( Rectal See Alternative 08/27/20 1824)    ED Course  I have reviewed the  triage vital signs and the nursing notes.  Pertinent labs & imaging results that were available during my care of the patient were reviewed by me and considered in my medical decision making (see chart for details).    MDM Rules/Calculators/A&P HEAR Score: 51                       75 year old female presents to the ED due to chest pain that has intensified over the past week.  Patient states pain is similar to past MI.  Pain relieved by nitroglycerin.  Upon arrival, patient afebrile, bradycardic at 58 with otherwise reassuring vitals.  Patient in no acute distress and nontoxic-appearing.  Routine labs and troponin ordered at triage. Presentation non-concerning for dissection. Low suspicion for DVT/PE. Discussed case with Dr. Madilyn Hook who agrees with assessment and plan.   CBC reassuring with mild anemia with hemoglobin of 10.9.  No leukocytosis.  BMP significant for mild elevated creatinine at 1.16, but otherwise reassuring.  Initial troponin normal at 9.  Will obtain delta troponin to rule out ACS.  Chest x-ray personally reviewed which is negative for signs of pneumonia, pneumothorax, or widened mediastinum.  EKG personally reviewed  which demonstrates normal sinus rhythm with nonspecific ST abnormalities.  Discussed case with cardiology (Dr. Rennis Golden) who recommends admission for catheterization in the a.m. given patient's history. Cardiology to admit. IV heparin and nitroglycerin paste started per cardiology recommendations.  Final Clinical Impression(s) / ED Diagnoses Final diagnoses:  Unstable angina [facility])    Rx / DC Orders ED Discharge Orders    None       Jesusita Oka 08/27/20 Clover Mealy, MD 08/28/20 [phone]

## 2020-08-27 NOTE — ED Triage Notes (Signed)
Pt here from home with c/o chest pain over the last week , pt had a redo of her stents back in oct that had closed off from 11 months prior

## 2020-08-27 NOTE — Progress Notes (Signed)
ANTICOAGULATION CONSULT NOTE - Initial Consult  Pharmacy Consult for heparin Indication: chest pain/ACS  Allergies  Allergen Reactions  . Atorvastatin Other (See Comments)    Cramps in legs  . Codeine Nausea And Vomiting  . Erythromycin Base Nausea Only  . Penicillin G Hives    Did it involve swelling of the face/tongue/throat, SOB, or low BP? No Did it involve sudden or severe rash/hives, skin peeling, or any reaction on the inside of your mouth or nose? Yes Did you need to seek medical attention at a hospital or doctor's office? Yes When did it last happen?2010 If all above answers are "NO", may proceed with cephalosporin use.   . Rosuvastatin Other (See Comments)    Muscle pain    Patient Measurements: Height: 5\' 3"  (160 cm) Weight: 63.5 kg (140 lb) IBW/kg (Calculated) : 52.4 Heparin Dosing Weight: TBW  Vital Signs: Temp: 98.7 F (37.1 C) (01/18 1253) Temp Source: Oral (01/18 1253) BP: 112/57 (01/18 1441) Pulse Rate: 53 (01/18 1441)  Labs: Recent Labs    08/27/20 1301 08/27/20 1652  HGB 10.9*  --   HCT 36.2  --   PLT 307  --   CREATININE 1.16*  --   TROPONINIHS 9 9    Estimated Creatinine Clearance: 39.3 mL/min (A) (by C-G formula based on SCr of 1.16 mg/dL (H)).   Medical History: Past Medical History:  Diagnosis Date  . Anxiety   . Arthritis   . CAD (coronary artery disease)    a. 07/2019 Inf STEMI/VF Arrest/CGS/PCI: LM nl, LADmin irregs, LCX nl, RCA 100p/m (2.75x38 & 2.75x26 Resolute Onyx DESs).  . Cataract   . Chest pain   . Depression   . GERD (gastroesophageal reflux disease)   . History of echocardiogram    a. 07/2019 Echo: EF 60-65%, no rwma, nl RV size/fxn, mildly dil LA, mild MR.  08/2019 Hyperlipidemia with target LDL less than 70 10/25/2015   Assessment: 74 YOF presenting with CP, hx CAD in RCA, not on anticoagulation PTA.  Chronic anemia stable, plts wnl.    Goal of Therapy:  Heparin level 0.3-0.7 units/ml Monitor platelets by  anticoagulation protocol: Yes   Plan:  Heparin 3800 units IV x1, and gtt at 750 units/hr F/u 8 hour heparin level with AM labs F/u cards plan and LOT  10/27/2015, PharmD Clinical Pharmacist ED Pharmacist Phone # [phone] 08/27/2020 7:12 PM

## 2020-08-27 NOTE — H&P (Signed)
Cardiology History and Physical:   Patient ID: Sarah Pacheco; [id number]; 09-Sep-1945   Admit date: 08/27/2020 Date of Consult: 08/27/2020  Primary Care Provider: Patient, No Pcp Per Primary Cardiologist: Peter Swaziland, MD 07/12/2020 Primary Electrophysiologist:  None   Patient Profile:   Sarah Pacheco is a 75 y.o. female with a hx of inferior STEMIcomplicated by VF arrest(during cath)and cardiogenic shock in 07/2019 requiring DES x 2. Recurrent CP >> cath 06/04/2020 w/ severe ISR RCA s/p scoring balloon PTCA.Hx  HLD on Repatha, GERD, anxiety, depression, and OA  who is being seen today for the evaluation of chest pain at the request of Dr Madilyn Hook.  History of Present Illness:   [name] was seen by Dr Swaziland 07/12/2020 and was doing well. F/u 4 months.  [name] came to the ER with chest pain, cards asked to see.   [name] was having some minor chest pains ever since her cath. However, over the last week or 2, she has had consistent CP w/ exertion. It is a burning pain that radiates to both arms. Similar to her MI pain, but not as severe. She had an episode of pain last pm that was worse than the others. It reached a 6-7/10. She took 3 SL NTG with relief of the pain.   She feels that she cannot do anything without getting the pain.  Walking room to room, she will get symptoms.  She has not had any pain that started at rest, but gets pain virtually every time she walks around.  She was concerned about her symptoms and came to the ER.  In the ER, she had recurrent pain when she walked to the bathroom.  She still has not had resting pain.  As long as she lies still, she is comfortable.   Past Medical History:  Diagnosis Date  . Anxiety   . Arthritis   . CAD (coronary artery disease)    a. 07/2019 Inf STEMI/VF Arrest/CGS/PCI: LM nl, LADmin irregs, LCX nl, RCA 100p/m (2.75x38 & 2.75x26 Resolute Onyx DESs).  . Cataract   . Chest pain   . Depression   . GERD (gastroesophageal  reflux disease)   . History of echocardiogram    a. 07/2019 Echo: EF 60-65%, no rwma, nl RV size/fxn, mildly dil LA, mild MR.  Marland Kitchen Hyperlipidemia with target LDL less than 70 10/25/2015    Past Surgical History:  Procedure Laterality Date  . APPENDECTOMY    . CATARACT EXTRACTION, BILATERAL    . COLONOSCOPY  2017   X2     colon polyps  . CORONARY BALLOON ANGIOPLASTY N/A 06/04/2020   Procedure: CORONARY BALLOON ANGIOPLASTY;  Surgeon: Corky Crafts, MD;  Location: [facility] INVASIVE CV LAB;  Service: Cardiovascular;  Laterality: N/A;  . CORONARY/GRAFT ACUTE MI REVASCULARIZATION N/A 07/11/2019   Procedure: CORONARY/GRAFT ACUTE MI REVASCULARIZATION;  Surgeon: Swaziland, Peter M, MD;  Location: [facility] INVASIVE CV LAB;  Service: Cardiovascular;  Laterality: N/A;  . INTRAVASCULAR ULTRASOUND/IVUS N/A 06/04/2020   Procedure: Intravascular Ultrasound/IVUS;  Surgeon: Corky Crafts, MD;  Location: [facility] INVASIVE CV LAB;  Service: Cardiovascular;  Laterality: N/A;  . LAPAROTOMY N/A 10/10/2018   Procedure: EXPLORATORY LAPAROTOMY RIGHT COLECTOMY;  Surgeon: Emelia Loron, MD;  Location: [facility] ORS;  Service: General;  Laterality: N/A;  . LEFT HEART CATH AND CORONARY ANGIOGRAPHY N/A 07/11/2019   Procedure: LEFT HEART CATH AND CORONARY ANGIOGRAPHY;  Surgeon: Swaziland, Peter M, MD;  Location: [facility] INVASIVE CV LAB;  Service: Cardiovascular;  Laterality: N/A;  .  LEFT HEART CATH AND CORONARY ANGIOGRAPHY N/A 06/04/2020   Procedure: LEFT HEART CATH AND CORONARY ANGIOGRAPHY;  Surgeon: Corky Crafts, MD;  Location: [facility] INVASIVE CV LAB;  Service: Cardiovascular;  Laterality: N/A;  . TEMPORARY PACEMAKER N/A 07/11/2019   Procedure: TEMPORARY PACEMAKER;  Surgeon: Swaziland, Peter M, MD;  Location: [facility] INVASIVE CV LAB;  Service: Cardiovascular;  Laterality: N/A;  . VAGINAL HYSTERECTOMY  1973   AUB, fibroids     Prior to Admission medications   Medication Sig Start Date End Date Taking? Authorizing Provider  acetaminophen  (TYLENOL) 500 MG tablet Take 1,000 mg by mouth every 6 (six) hours as needed for mild pain or headache.     [provider]  aspirin EC 81 MG EC tablet Take 1 tablet (81 mg total) by mouth daily. 07/14/19   Dunn, Tacey Ruiz, PA-C  Cyanocobalamin (VITAMIN B-12) 5000 MCG TBDP Take 5,000 mcg by mouth daily.     [provider]  escitalopram (LEXAPRO) 10 MG tablet Take 1 tablet (10 mg total) by mouth daily. 07/12/20   Swaziland, Peter M, MD  Evolocumab (REPATHA SURECLICK) 140 MG/ML SOAJ Inject 1 Dose into the skin every 14 (fourteen) days. 06/12/20   Hilty, Lisette Abu, MD  ezetimibe (ZETIA) 10 MG tablet Take 1 tablet (10 mg total) by mouth daily. 11/14/19   Hilty, Lisette Abu, MD  metoprolol tartrate (LOPRESSOR) 25 MG tablet Take 0.5 tablets (12.5 mg total) by mouth 2 (two) times daily. 06/21/20 09/19/20  Leone Brand, NP  nitroGLYCERIN (NITROSTAT) 0.4 MG SL tablet Place 1 tablet (0.4 mg total) under the tongue every 5 (five) minutes as needed for chest pain (up to 3 doses. If taking 3rd dose call 911). 06/21/20   Leone Brand, NP  Omega-3 Fatty Acids (EQL OMEGA 3 FISH OIL) 1200 MG CAPS Take 1,200 mg by mouth daily.    [provider]  pantoprazole (PROTONIX) 40 MG tablet Take 1 tablet (40 mg total) by mouth daily. 08/16/18   Lynann Bologna, MD  simvastatin (ZOCOR) 40 MG tablet Take 1 tablet (40 mg total) by mouth daily. 07/12/20   Swaziland, Peter M, MD  ticagrelor (BRILINTA) 90 MG TABS tablet Take 1 tablet (90 mg total) by mouth 2 (two) times daily. 04/04/20   Swaziland, Peter M, MD    Inpatient Medications: Scheduled Meds:  Continuous Infusions:  PRN Meds:   Allergies:    Allergies  Allergen Reactions  . Atorvastatin Other (See Comments)    Cramps in legs  . Codeine Nausea And Vomiting  . Erythromycin Base Nausea Only  . Penicillin G Hives    Did it involve swelling of the face/tongue/throat, SOB, or low BP? No Did it involve sudden or severe rash/hives, skin peeling, or any  reaction on the inside of your mouth or nose? Yes Did you need to seek medical attention at a hospital or doctor's office? Yes When did it last happen?2010 If all above answers are "NO", may proceed with cephalosporin use.   . Rosuvastatin Other (See Comments)    Muscle pain    Social History:   Social History   Socioeconomic History  . Marital status: Divorced    Spouse name: Not on file  . Number of children: Not on file  . Years of education: Not on file  . Highest education level: Not on file  Occupational History  . Not on file  Tobacco Use  . Smoking status: Former Smoker    Packs/day: 0.50  Years: 15.00    Pack years: 7.50    Types: Cigarettes  . Smokeless tobacco: Never Used  . Tobacco comment: Quit around age 75  Vaping Use  . Vaping Use: Never used  Substance and Sexual Activity  . Alcohol use: No  . Drug use: No  . Sexual activity: Not on file  Other Topics Concern  . Not on file  Social History Narrative   Lives locally with husband.   Social Determinants of Health   Financial Resource Strain: Not on file  Food Insecurity: Not on file  Transportation Needs: Not on file  Physical Activity: Not on file  Stress: Not on file  Social Connections: Not on file  Intimate Partner Violence: Not on file    Family History:   Family History  Problem Relation Age of Onset  . Heart attack Father [id number]       MI  . Heart disease Father   . Heart disease Sister   . Heart attack Brother 59       MI  . Heart attack Sister   . Heart disease Sister   . Colon cancer Sister [id number]  . Colon polyps Sister   . Heart disease Brother   . Heart failure Brother   . Colon cancer Brother 165  . Colon polyps Brother   . COPD Brother   . Lung cancer Brother   . Esophageal cancer Neg Hx   . Rectal cancer Neg Hx   . Stomach cancer Neg Hx    Family Status:  Family Status  Relation Name Status  . Mother  Deceased  . Father  Deceased  . Sister  Deceased  . Brother   Deceased  . Sister  Alive  . Sister  Alive  . Brother  Alive  . Brother  Deceased  . Brother  Deceased  . Neg Hx  (Not Specified)    ROS:  Please see the history of present illness.  All other ROS reviewed and negative.     Physical Exam/Data:   Vitals:   08/27/20 1253 08/27/20 1441  BP: 135/66 (!) 112/57  Pulse: (!) 58 (!) 53  Resp: 16 18  Temp: 98.7 F (37.1 C)   TempSrc: Oral   SpO2: 100% 100%  Weight: 63.5 kg   Height: 5\' 3"  (1.6 m)    No intake or output data in the 24 hours ending 08/27/20 1711  Last 3 Weights 08/27/2020 07/12/2020 06/21/2020  Weight (lbs) 140 lb 141 lb 3.2 oz 139 lb 6.4 oz  Weight (kg) 63.504 kg 64.048 kg 63.231 kg     Body mass index is 24.8 kg/m.   General:  Well nourished, well developed, female in no acute distress HEENT: normal Lymph: no adenopathy Neck: JVD -not elevated Endocrine:  No thryomegaly Vascular: No carotid bruits; 4/4 extremity pulses 2+  Cardiac:  normal S1, S2; RRR; no murmur Lungs:  clear bilaterally, no wheezing, rhonchi or rales  Abd: soft, nontender, no hepatomegaly  Ext: no edema Musculoskeletal:  No deformities, BUE and BLE strength normal and equal Skin: warm and dry  Neuro:  CNs 2-12 intact, no focal abnormalities noted Psych:  Normal affect   EKG:  The EKG was personally reviewed and demonstrates:  SR, HR 60, no acute ischemic changes Telemetry:  Telemetry was personally reviewed and demonstrates:  SR   CV studies:   ECHO: 07/12/2019 1. Left ventricular ejection fraction, by visual estimation, is 60 to  65%. The left ventricle has normal function.  There is no left ventricular  hypertrophy.  2. Left ventricular diastolic parameters are indeterminate.  3. Global right ventricle has normal systolic function.The right  ventricular size is normal. No increase in right ventricular wall  thickness.  4. Left atrial size was mildly dilated.  5. Right atrial size was normal.  6. The mitral valve is  normal in structure. Mild mitral valve  regurgitation.  7. The tricuspid valve is normal in structure. Tricuspid valve  regurgitation is not demonstrated.  8. The aortic valve is tricuspid. Aortic valve regurgitation is not  visualized. No evidence of aortic valve sclerosis or stenosis.  9. The pulmonic valve was grossly normal. Pulmonic valve regurgitation is  trivial.  10. TR signal is inadequate for assessing pulmonary artery systolic  pressure.  11. The inferior vena cava is normal in size with greater than 50%  respiratory variability, suggesting right atrial pressure of 3 mmHg.   CATH: 06/04/2020  Prox RCA to Mid RCA lesion is 99% stenosed. Mid RCA lesion is 95% stenosed.  Scoring balloon angioplasty was performed using a BALLOON WOLVERINE 3.00X10, followed by a 3.5 Hamilton balloon, on both lesions.  Post intervention, there is a 0% residual stenosis.  The left ventricular systolic function is normal.  LV end diastolic pressure is normal.  The left ventricular ejection fraction is 55-65% by visual estimate.  There is no aortic valve stenosis.   Continue aggressive secondary prevention.  Patient with some residual pain, likely related to some decreased flow in marginal branches.   Diagnostic Dominance: Right    Intervention       Laboratory Data:   Chemistry Recent Labs  Lab 08/27/20 1301  NA 136  K 4.3  CL 105  CO2 21*  GLUCOSE 94  BUN 15  CREATININE 1.16*  CALCIUM 9.0  GFRNONAA 50*  ANIONGAP 10    Lab Results  Component Value Date   ALT 13 06/03/2020   AST 21 06/03/2020   ALKPHOS 56 06/03/2020   BILITOT 0.9 06/03/2020   Hematology Recent Labs  Lab 08/27/20 1301  WBC 6.5  RBC 4.27  HGB 10.9*  HCT 36.2  MCV 84.8  MCH 25.5*  MCHC 30.1  RDW 15.1  PLT 307   Cardiac Enzymes High Sensitivity Troponin:   Recent Labs  Lab 08/27/20 1301  TROPONINIHS 9      BNPNo results for input(s): BNP, PROBNP in the last 168 hours.  DDimer No  results for input(s): DDIMER in the last 168 hours. TSH: No results found for: TSH Lipids: Lab Results  Component Value Date   CHOL 175 05/20/2020   HDL 55 05/20/2020   LDLCALC 97 05/20/2020   TRIG 130 05/20/2020   CHOLHDL 3.2 05/20/2020   HgbA1c: Lab Results  Component Value Date   HGBA1C 5.8 (H) 07/11/2019   Magnesium:  Magnesium  Date Value Ref Range Status  06/03/2020 2.2 1.7 - 2.4 mg/dL Final    Comment:    Performed at [facility], [address] [place], [place] [address]     Radiology/Studies:  DG Chest 2 View  Result Date: 08/27/2020 CLINICAL DATA:  Chest pain EXAM: CHEST - 2 VIEW COMPARISON:  06/01/2020 FINDINGS: The heart size and mediastinal contours are within normal limits. Both lungs are clear. The visualized skeletal structures are unremarkable. IMPRESSION: No active cardiopulmonary disease. Electronically Signed   By: Kennith CenterEric  Mansell M.D.   On: 08/27/2020 13:22    Assessment and Plan:   1. Chest pain, unstable anginal  pain - She has significant progression in her symptoms over the last week or 2 - She is getting chest pain with minimal activity - Symptoms are exertional, and relieved by nitroglycerin - ECG is not acute, initial enzymes are negative. - Feel we should admit the patient, add heparin and nitroglycerin paste, cath in a.m. - Patient is agreeable to this plan  2.  Hyperlipidemia: - She has been on Repatha and has had several doses. - Check lipids and LFTs in a.m.  Active Problems:   * No active hospital problems. *     For questions or updates, please contact [facility] Please consult [email or web address] for contact info under Cardiology/STEMI.   SignedTheodore Demark, PA-C  08/27/2020 5:11 PM

## 2020-08-28 ENCOUNTER — Encounter (HOSPITAL_COMMUNITY)
Admission: EM | Disposition: A | Discharge status: Home / Self Care | Payer: Self-pay | Source: Home / Self Care | Attending: Internal Medicine

## 2020-08-28 ENCOUNTER — Encounter (HOSPITAL_COMMUNITY): Payer: Self-pay | Admitting: Internal Medicine

## 2020-08-28 ENCOUNTER — Other Ambulatory Visit: Payer: Self-pay

## 2020-08-28 DIAGNOSIS — T82855A Stenosis of coronary artery stent, initial encounter: Secondary | ICD-10-CM

## 2020-08-28 DIAGNOSIS — I25110 Atherosclerotic heart disease of native coronary artery with unstable angina pectoris: Principal | ICD-10-CM

## 2020-08-28 DIAGNOSIS — I10 Essential (primary) hypertension: Secondary | ICD-10-CM

## 2020-08-28 DIAGNOSIS — T82855D Stenosis of coronary artery stent, subsequent encounter: Secondary | ICD-10-CM

## 2020-08-28 HISTORY — PX: CORONARY STENT INTERVENTION: CATH118234

## 2020-08-28 HISTORY — PX: LEFT HEART CATH AND CORONARY ANGIOGRAPHY: CATH118249

## 2020-08-28 LAB — BASIC METABOLIC PANEL
Anion gap: 10 (ref 5–15)
BUN: 20 mg/dL (ref 8–23)
CO2: 20 mmol/L — ABNORMAL LOW (ref 22–32)
Calcium: 8.9 mg/dL (ref 8.9–10.3)
Chloride: 109 mmol/L (ref 98–111)
Creatinine, Ser: 1.2 mg/dL — ABNORMAL HIGH (ref 0.44–1.00)
GFR, Estimated: 48 mL/min — ABNORMAL LOW (ref >60)
Glucose, Bld: 100 mg/dL — ABNORMAL HIGH (ref 70–99)
Potassium: 3.8 mmol/L (ref 3.5–5.1)
Sodium: 139 mmol/L (ref 135–145)

## 2020-08-28 LAB — CBC
HCT: 29.5 % — ABNORMAL LOW (ref 36.0–46.0)
Hemoglobin: 9.5 g/dL — ABNORMAL LOW (ref 12.0–15.0)
MCH: 26.4 pg (ref 26.0–34.0)
MCHC: 32.2 g/dL (ref 30.0–36.0)
MCV: 81.9 fL (ref 80.0–100.0)
Platelets: 250 10*3/uL (ref 150–400)
RBC: 3.6 MIL/uL — ABNORMAL LOW (ref 3.87–5.11)
RDW: 15.3 % (ref 11.5–15.5)
WBC: 7.2 10*3/uL (ref 4.0–10.5)
nRBC: 0 % (ref 0.0–0.2)

## 2020-08-28 LAB — POCT ACTIVATED CLOTTING TIME
Activated Clotting Time: 303 seconds
Activated Clotting Time: 279 seconds

## 2020-08-28 LAB — HEPARIN LEVEL (UNFRACTIONATED)
Heparin Unfractionated: 0.47 IU/mL (ref 0.30–0.70)
Heparin Unfractionated: 0.38 IU/mL (ref 0.30–0.70)

## 2020-08-28 LAB — TROPONIN I (HIGH SENSITIVITY): Troponin I (High Sensitivity): 8 ng/L (ref <18)

## 2020-08-28 SURGERY — LEFT HEART CATH AND CORONARY ANGIOGRAPHY
Anesthesia: LOCAL

## 2020-08-28 MED ORDER — SODIUM CHLORIDE 0.9% FLUSH: 3.0000 mL | INTRAVENOUS | Status: DC | PRN

## 2020-08-28 MED ORDER — HEPARIN SODIUM (PORCINE) 1000 UNIT/ML IJ SOLN
INTRAMUSCULAR | Status: AC
  Filled 2020-08-28: qty 1

## 2020-08-28 MED ORDER — VITAMIN B-12 100 MCG PO TABS
500.0000 ug | ORAL_TABLET | Freq: Every day | ORAL | Status: DC
  Administered 2020-08-29: 500 ug via ORAL
  Filled 2020-08-28 (×2): qty 5

## 2020-08-28 MED ORDER — SODIUM CHLORIDE 0.9 % IV SOLN: 250.0000 mL | INTRAVENOUS | Status: DC | PRN

## 2020-08-28 MED ORDER — EZETIMIBE 10 MG PO TABS
10.0000 mg | ORAL_TABLET | Freq: Every day | ORAL | Status: DC
  Administered 2020-08-28: 10 mg via ORAL
  Filled 2020-08-28: qty 1

## 2020-08-28 MED ORDER — ASPIRIN 81 MG PO CHEW
81.0000 mg | CHEWABLE_TABLET | ORAL | Status: AC
  Administered 2020-08-28: 81 mg via ORAL
  Filled 2020-08-28: qty 1

## 2020-08-28 MED ORDER — SODIUM CHLORIDE 0.9 % IV SOLN
INTRAVENOUS | Status: AC | PRN
  Administered 2020-08-28: 250 mL via INTRAVENOUS

## 2020-08-28 MED ORDER — PANTOPRAZOLE SODIUM 40 MG PO TBEC
40.0000 mg | DELAYED_RELEASE_TABLET | Freq: Every day | ORAL | Status: DC
  Administered 2020-08-28 – 2020-08-29 (×2): 40 mg via ORAL
  Filled 2020-08-28 (×2): qty 1

## 2020-08-28 MED ORDER — HEPARIN (PORCINE) IN NACL 1000-0.9 UT/500ML-% IV SOLN
INTRAVENOUS | Status: DC | PRN
  Administered 2020-08-28 (×2): 500 mL

## 2020-08-28 MED ORDER — TICAGRELOR 90 MG PO TABS
90.0000 mg | ORAL_TABLET | Freq: Two times a day (BID) | ORAL | Status: DC
  Administered 2020-08-28 – 2020-08-29 (×3): 90 mg via ORAL
  Filled 2020-08-28 (×3): qty 1

## 2020-08-28 MED ORDER — LIDOCAINE HCL (PF) 1 % IJ SOLN
INTRAMUSCULAR | Status: AC
  Filled 2020-08-28: qty 30

## 2020-08-28 MED ORDER — METOPROLOL TARTRATE 12.5 MG HALF TABLET
12.5000 mg | ORAL_TABLET | Freq: Two times a day (BID) | ORAL | Status: DC
  Administered 2020-08-28 – 2020-08-29 (×2): 12.5 mg via ORAL
  Filled 2020-08-28 (×3): qty 1

## 2020-08-28 MED ORDER — IOHEXOL 350 MG/ML SOLN
INTRAVENOUS | Status: DC | PRN
  Administered 2020-08-28: 130 mL

## 2020-08-28 MED ORDER — ONDANSETRON HCL 4 MG/2ML IJ SOLN
INTRAMUSCULAR | Status: DC | PRN
  Administered 2020-08-28: 4 mg via INTRAVENOUS

## 2020-08-28 MED ORDER — ESCITALOPRAM OXALATE 10 MG PO TABS
10.0000 mg | ORAL_TABLET | Freq: Every day | ORAL | Status: DC
  Administered 2020-08-28 – 2020-08-29 (×2): 10 mg via ORAL
  Filled 2020-08-28 (×2): qty 1

## 2020-08-28 MED ORDER — MIDAZOLAM HCL 2 MG/2ML IJ SOLN
INTRAMUSCULAR | Status: AC
  Filled 2020-08-28: qty 2

## 2020-08-28 MED ORDER — FENTANYL CITRATE (PF) 100 MCG/2ML IJ SOLN
INTRAMUSCULAR | Status: DC | PRN
  Administered 2020-08-28 (×3): 25 ug via INTRAVENOUS

## 2020-08-28 MED ORDER — SODIUM CHLORIDE 0.9 % WEIGHT BASED INFUSION
1.0000 mL/kg/h | INTRAVENOUS | Status: DC
Start: 2020-08-28 — End: 2020-08-28
  Administered 2020-08-28: 1 mL/kg/h via INTRAVENOUS

## 2020-08-28 MED ORDER — MIDAZOLAM HCL 2 MG/2ML IJ SOLN
INTRAMUSCULAR | Status: DC | PRN
  Administered 2020-08-28 (×2): 1 mg via INTRAVENOUS

## 2020-08-28 MED ORDER — LABETALOL HCL 5 MG/ML IV SOLN: 10.0000 mg | INTRAVENOUS | Status: AC | PRN

## 2020-08-28 MED ORDER — LIDOCAINE HCL (PF) 1 % IJ SOLN
INTRAMUSCULAR | Status: DC | PRN
  Administered 2020-08-28: 2 mL

## 2020-08-28 MED ORDER — HEPARIN SODIUM (PORCINE) 1000 UNIT/ML IJ SOLN
INTRAMUSCULAR | Status: DC | PRN
  Administered 2020-08-28: 7000 [IU] via INTRAVENOUS

## 2020-08-28 MED ORDER — SODIUM CHLORIDE 0.9 % IV SOLN: INTRAVENOUS | Status: AC

## 2020-08-28 MED ORDER — SODIUM CHLORIDE 0.9% FLUSH: 3.0000 mL | Freq: Two times a day (BID) | INTRAVENOUS | Status: DC

## 2020-08-28 MED ORDER — HEPARIN (PORCINE) IN NACL 1000-0.9 UT/500ML-% IV SOLN
INTRAVENOUS | Status: AC
  Filled 2020-08-28: qty 1000

## 2020-08-28 MED ORDER — ASPIRIN EC 81 MG PO TBEC
81.0000 mg | DELAYED_RELEASE_TABLET | Freq: Every day | ORAL | Status: DC
  Administered 2020-08-29: 81 mg via ORAL
  Filled 2020-08-28: qty 1

## 2020-08-28 MED ORDER — FENTANYL CITRATE (PF) 100 MCG/2ML IJ SOLN
INTRAMUSCULAR | Status: AC
  Filled 2020-08-28: qty 2

## 2020-08-28 MED ORDER — SIMVASTATIN 20 MG PO TABS: 40.0000 mg | ORAL_TABLET | Freq: Every day | ORAL | Status: DC

## 2020-08-28 MED ORDER — ONDANSETRON HCL 4 MG/2ML IJ SOLN
INTRAMUSCULAR | Status: AC
  Filled 2020-08-28: qty 2

## 2020-08-28 MED ORDER — VERAPAMIL HCL 2.5 MG/ML IV SOLN
INTRAVENOUS | Status: AC
  Filled 2020-08-28: qty 2

## 2020-08-28 MED ORDER — NITROGLYCERIN 1 MG/10 ML FOR IR/CATH LAB
INTRA_ARTERIAL | Status: DC | PRN
  Administered 2020-08-28: 200 ug
  Administered 2020-08-28: 100 ug
  Administered 2020-08-28: 200 ug

## 2020-08-28 MED ORDER — HYDRALAZINE HCL 20 MG/ML IJ SOLN: 10.0000 mg | INTRAMUSCULAR | Status: AC | PRN

## 2020-08-28 MED ORDER — VERAPAMIL HCL 2.5 MG/ML IV SOLN
INTRAVENOUS | Status: DC | PRN
  Administered 2020-08-28: 10 mL via INTRA_ARTERIAL

## 2020-08-28 SURGICAL SUPPLY — 22 items
BAG SNAP BAND KOVER 36X36 (MISCELLANEOUS) ×2 IMPLANT
BALLN SAPPHIRE ~~LOC~~ 3.5X15 (BALLOONS) ×2 IMPLANT
BALLN SAPPHIRE ~~LOC~~ 3.75X15 (BALLOONS) ×2 IMPLANT
BALLN WOLVERINE 3.00X15 (BALLOONS) ×2
BALLOON WOLVERINE 3.00X15 (BALLOONS) ×1 IMPLANT
CATH OPTITORQUE TIG 4.0 5F (CATHETERS) ×2 IMPLANT
CATH VISTA GUIDE 6FR JR4 (CATHETERS) ×2 IMPLANT
COVER DOME SNAP 22 D (MISCELLANEOUS) ×2 IMPLANT
DEVICE RAD TR BAND REGULAR (VASCULAR PRODUCTS) ×2 IMPLANT
GLIDESHEATH SLEND SS 6F .021 (SHEATH) ×2 IMPLANT
GUIDEWIRE INQWIRE 1.5J.035X260 (WIRE) ×1 IMPLANT
INQWIRE 1.5J .035X260CM (WIRE) ×2
KIT ENCORE 26 ADVANTAGE (KITS) ×2 IMPLANT
KIT HEART LEFT (KITS) ×2 IMPLANT
PACK CARDIAC CATHETERIZATION (CUSTOM PROCEDURE TRAY) ×2 IMPLANT
STENT SYNERGY XD 3.50X28 (Permanent Stent) ×1 IMPLANT
STENT SYNERGY XD 3.50X38 (Permanent Stent) ×1 IMPLANT
SYNERGY XD 3.50X28 (Permanent Stent) ×2 IMPLANT
SYNERGY XD 3.50X38 (Permanent Stent) ×2 IMPLANT
TRANSDUCER W/STOPCOCK (MISCELLANEOUS) ×2 IMPLANT
TUBING CIL FLEX 10 FLL-RA (TUBING) ×2 IMPLANT
WIRE ASAHI PROWATER 180CM (WIRE) ×2 IMPLANT

## 2020-08-28 NOTE — ED Notes (Signed)
Spoke with Dr. Mackie Pai of cardiology about BP r/t nitro paste. He stated if it gets below 90 SBP to D/C and let him know.

## 2020-08-28 NOTE — Progress Notes (Signed)
Handoff given to [facility]. Pt arm cont to have small hematoma, BP cuff remains on arm at 65mm. Emelda Brothers RN

## 2020-08-28 NOTE — Progress Notes (Addendum)
ANTICOAGULATION CONSULT NOTE  Pharmacy Consult for heparin Indication: chest pain/ACS  Allergies  Allergen Reactions  . Atorvastatin Other (See Comments)    Cramps in legs  . Codeine Nausea And Vomiting  . Erythromycin Base Nausea Only  . Penicillin G Hives    Did it involve swelling of the face/tongue/throat, SOB, or low BP? No Did it involve sudden or severe rash/hives, skin peeling, or any reaction on the inside of your mouth or nose? Yes Did you need to seek medical attention at a hospital or doctor's office? Yes When did it last happen?2010 If all above answers are "NO", may proceed with cephalosporin use.   . Rosuvastatin Other (See Comments)    Muscle pain    Patient Measurements: Height: 5\' 3"  (160 cm) Weight: 62.4 kg (137 lb 8 oz) IBW/kg (Calculated) : 52.4 Heparin Dosing Weight: TBW  Vital Signs: Temp: 98 F (36.7 C) (01/19 0406) Temp Source: Oral (01/19 0406) BP: 104/63 (01/19 0406) Pulse Rate: 63 (01/19 0200)  Labs: Recent Labs    08/27/20 1301 08/27/20 1652 08/27/20 2213 08/28/20 0328  HGB 10.9*  --   --  9.5*  HCT 36.2  --   --  29.5*  PLT 307  --   --  250  HEPARINUNFRC  --   --   --  0.47  CREATININE 1.16*  --   --  1.20*  TROPONINIHS 9 9 8 8     Estimated Creatinine Clearance: 35.1 mL/min (A) (by C-G formula based on SCr of 1.2 mg/dL (H)).   Medical History: Past Medical History:  Diagnosis Date  . Anxiety   . Arthritis   . CAD (coronary artery disease)    a. 07/2019 Inf STEMI/VF Arrest/CGS/PCI: LM nl, LADmin irregs, LCX nl, RCA 100p/m (2.75x38 & 2.75x26 Resolute Onyx DESs).  . Cataract   . Chest pain   . Depression   . GERD (gastroesophageal reflux disease)   . History of echocardiogram    a. 07/2019 Echo: EF 60-65%, no rwma, nl RV size/fxn, mildly dil LA, mild MR.  08/2019 Hyperlipidemia with target LDL less than 70 10/25/2015   Assessment: 74 YOF presenting with CP, hx CAD in RCA, not on anticoagulation PTA.    Initial heparin level  came back therapeutic at 0.47, on 750 units/hr. Hgb 9.5, plt 250. Trop 8. No s/sx of bleeding or infusion issues.     Goal of Therapy:  Heparin level 0.3-0.7 units/ml Monitor platelets by anticoagulation protocol: Yes   Plan:  Heparin gtt at 750 units/hr F/u 8 hour heparin level F/u cards plan and LOT  Marland Kitchen, PharmD, BCCCP Clinical Pharmacist  Phone: [phone] 08/28/2020 7:18 AM  Please check AMION for all [facility] Pharmacy phone numbers After 10:00 PM, call Main Pharmacy [phone]  ADDENDUM Heparin level came back therapeutic at 0.38, on 750 units/hr. No s/sx of bleeding or infusion issues. Continue at same rate and get level with AM labs.  [facility], PharmD, BCCCP Clinical Pharmacist

## 2020-08-28 NOTE — Progress Notes (Signed)
Pt arrived back from cath lab with right radial ulnar band. On assessment a small hematoma was present. Pressure held, hematoma softened. When rechecked in hematoma had returned and arm was much tighter above band. Fingers were purple, good cap refill, and pt complained of a lot of pain in hand and arm. Pt was not able to straighten fingers. Ronalee Belts PA paiged, sent Dr. Shari Prows to assess. Dr. Herbie Baltimore was paged to make aware as well. He also arrived to bedside. Dr. Shari Prows held pressure and Dr. Herbie Baltimore released 4cc out of band. Color in hand better. Dr. Herbie Baltimore had me place BP cuff at 73mm above site. Stated to leave on for at least and then slowly release pressure. Cont to monitor. Emelda Brothers RN

## 2020-08-28 NOTE — Interval H&P Note (Signed)
History and Physical Interval Note:  08/28/2020 4:05 PM  Sarah Pacheco  has presented today for surgery, with the diagnosis of unstable angina.  The various methods of treatment have been discussed with the patient and family. After consideration of risks, benefits and other options for treatment, the patient has consented to  Procedure(s): LEFT HEART CATH AND CORONARY ANGIOGRAPHY (N/A)  PERCUTANEOUS CORONARY INTERVENTION  as a surgical intervention.  The patient's history has been reviewed, patient examined, no change in status, stable for surgery.  I have reviewed the patient's chart and labs.  Questions were answered to the patient's satisfaction.     Cath Lab Visit (complete for each Cath Lab visit)  Clinical Evaluation Leading to the Procedure:   ACS: Yes.    Non-ACS:    Anginal Classification: CCS III  Anti-ischemic medical therapy: Minimal Therapy (1 class of medications)  Non-Invasive Test Results: No non-invasive testing performed  Prior CABG: No previous CABG    Bryan Lemma

## 2020-08-28 NOTE — Progress Notes (Signed)
 Progress Note  Patient Name: Sarah Pacheco Date of Encounter: 08/28/2020  [facility] Cardiologist: Peter Jordan, MD   Subjective   No chest pain with nitro patch in place. No complaints.   Inpatient Medications    Scheduled Meds: . nitroGLYCERIN  0.5 inch Topical Q6H  . sodium chloride flush  3 mL Intravenous Q12H  . sodium chloride flush  3 mL Intravenous Q12H   Continuous Infusions: . sodium chloride    . sodium chloride    . sodium chloride 1 mL/kg/hr (08/28/20 0445)  . heparin 750 Units/hr (08/27/20 2201)   PRN Meds: sodium chloride, sodium chloride, acetaminophen, ALPRAZolam, nitroGLYCERIN, ondansetron (ZOFRAN) IV, sodium chloride flush, sodium chloride flush, zolpidem   Vital Signs    Vitals:   08/28/20 0200 08/28/20 0207 08/28/20 0242 08/28/20 0406  BP: (!) 96/54  122/64 104/63  Pulse: 63     Resp: 14   18  Temp:  98.2 F (36.8 C) 97.9 F (36.6 C) 98 F (36.7 C)  TempSrc:   Oral Oral  SpO2: 96%  96% 97%  Weight:    62.4 kg  Height:       No intake or output data in the 24 hours ending 08/28/20 0845 Last 3 Weights 08/28/2020 08/27/2020 07/12/2020  Weight (lbs) 137 lb 8 oz 140 lb 141 lb 3.2 oz  Weight (kg) 62.37 kg 63.504 kg 64.048 kg      Telemetry    Sinus rhythm in the 60s - Personally Reviewed  ECG    No new tracings - Personally Reviewed  Physical Exam   GEN: No acute distress.   Neck: No JVD Cardiac: RRR, no murmurs, rubs, or gallops.  Respiratory: Clear to auscultation bilaterally. GI: Soft, nontender, non-distended  MS: No edema; No deformity. Neuro:  Nonfocal  Psych: Normal affect   Labs    High Sensitivity Troponin:   Recent Labs  Lab 08/27/20 1301 08/27/20 1652 08/27/20 2213 08/28/20 0328  TROPONINIHS 9 9 8 8      Chemistry Recent Labs  Lab 08/27/20 1301 08/27/20 2213 08/28/20 0328  NA 136  --  139  K 4.3  --  3.8  CL 105  --  109  CO2 21*  --  20*  GLUCOSE 94  --  100*  BUN 15  --  20  CREATININE 1.16*   --  1.20*  CALCIUM 9.0  --  8.9  PROT  --  6.5  --   ALBUMIN  --  3.6  --   AST  --  22  --   ALT  --  13  --   ALKPHOS  --  60  --   BILITOT  --  0.6  --   GFRNONAA 50*  --  48*  ANIONGAP 10  --  10     Hematology Recent Labs  Lab 08/27/20 1301 08/28/20 0328  WBC 6.5 7.2  RBC 4.27 3.60*  HGB 10.9* 9.5*  HCT 36.2 29.5*  MCV 84.8 81.9  MCH 25.5* 26.4  MCHC 30.1 32.2  RDW 15.1 15.3  PLT 307 250    BNPNo results for input(s): BNP, PROBNP in the last 168 hours.   DDimer No results for input(s): DDIMER in the last 168 hours.   Radiology    DG Chest 2 View  Result Date: 08/27/2020 CLINICAL DATA:  Chest pain EXAM: CHEST - 2 VIEW COMPARISON:  06/01/2020 FINDINGS: The heart size and mediastinal contours are within normal limits. Both lungs are   clear. The visualized skeletal structures are unremarkable. IMPRESSION: No active cardiopulmonary disease. Electronically Signed   By: Kennith Center M.D.   On: 08/27/2020 13:22    Cardiac Studies   ECHO: 07/12/2019 1. Left ventricular ejection fraction, by visual estimation, is 60 to  65%. The left ventricle has normal function. There is no left ventricular  hypertrophy.  2. Left ventricular diastolic parameters are indeterminate.  3. Global right ventricle has normal systolic function.The right  ventricular size is normal. No increase in right ventricular wall  thickness.  4. Left atrial size was mildly dilated.  5. Right atrial size was normal.  6. The mitral valve is normal in structure. Mild mitral valve  regurgitation.  7. The tricuspid valve is normal in structure. Tricuspid valve  regurgitation is not demonstrated.  8. The aortic valve is tricuspid. Aortic valve regurgitation is not  visualized. No evidence of aortic valve sclerosis or stenosis.  9. The pulmonic valve was grossly normal. Pulmonic valve regurgitation is  trivial.  10. TR signal is inadequate for assessing pulmonary artery systolic  pressure.   11. The inferior vena cava is normal in size with greater than 50%  respiratory variability, suggesting right atrial pressure of 3 mmHg.   CATH: 06/04/2020  Prox RCA to Mid RCA lesion is 99% stenosed. Mid RCA lesion is 95% stenosed.  Scoring balloon angioplasty was performed using a BALLOON WOLVERINE 3.00X10, followed by a 3.5  balloon, on both lesions.  Post intervention, there is a 0% residual stenosis.  The left ventricular systolic function is normal.  LV end diastolic pressure is normal.  The left ventricular ejection fraction is 55-65% by visual estimate.  There is no aortic valve stenosis.  Continue aggressive secondary prevention. Patient with some residual pain, likely related to some decreased flow in marginal branches.  Diagnostic Dominance: Right    Intervention      Patient Profile     75 y.o. female with a hx of inferior STEMIcomplicated by VF arrest(during cath)and cardiogenic shock in 07/2019 requiring DES x 2. Recurrent CP >> cath 06/04/2020 w/ severe ISR RCA s/p scoring balloon PTCA.Hx  HLD on Repatha, GERD, anxiety, depression, and OA  who is being seen today for the evaluation of chest pain  Assessment & Plan    Chest pain CAD - hs troponin 9 --> 9 --> 8 --> 8 - symptoms concerning for unstable angina relieved by nitro - will proceed with heart cath today - continue ASA and brilinta   Hyperlipidemia with LDL goal < 70 - on repatha, zetia, and zocor - 08/27/2020: Cholesterol 86; HDL 61; LDL Cholesterol 19; Triglycerides 32; VLDL 6 - excellent control of lipids   Hypertension - contniue 12.5 mg BID loperssor    For questions or updates, please contact [facility] Please consult [email or web address] for contact info under        Signed, Marcelino Duster, PA  08/28/2020, 8:45 AM

## 2020-08-28 NOTE — Progress Notes (Signed)
Received pt.with B/P cuff inflated @37  mm Hg.Continued to decrease BP cuff 3-5 mm hgevery 2-3 minutes until cuff is deflated.Then removed the B/P cuff & assesed the site & noted hematoma is resolved .Fingers warm to touch & with good capillary refill..Will continue to monitor the site.

## 2020-08-28 NOTE — Progress Notes (Signed)
TR BAND REMOVAL  LOCATION:    right radial  DEFLATED PER PROTOCOL:    Yes.    TIME BAND OFF / DRESSING APPLIED:    2130   SITE UPON ARRIVAL:    Level 1  SITE AFTER BAND REMOVAL:    Level 1  CIRCULATION SENSATION AND MOVEMENT:    Within Normal Limits   Yes.    COMMENTS:   Pt.tolerated procedure well.Site is now soft but bruised around the site.fingers warm to touch & with good capillary refill.

## 2020-08-28 NOTE — H&P (View-Only) (Signed)
Progress Note  Patient Name: Sarah Pacheco Date of Encounter: 08/28/2020  [facility] Cardiologist: Peter Swaziland, MD   Subjective   No chest pain with nitro patch in place. No complaints.   Inpatient Medications    Scheduled Meds: . nitroGLYCERIN  0.5 inch Topical Q6H  . sodium chloride flush  3 mL Intravenous Q12H  . sodium chloride flush  3 mL Intravenous Q12H   Continuous Infusions: . sodium chloride    . sodium chloride    . sodium chloride 1 mL/kg/hr (08/28/20 0445)  . heparin 750 Units/hr (08/27/20 2201)   PRN Meds: sodium chloride, sodium chloride, acetaminophen, ALPRAZolam, nitroGLYCERIN, ondansetron (ZOFRAN) IV, sodium chloride flush, sodium chloride flush, zolpidem   Vital Signs    Vitals:   08/28/20 0200 08/28/20 0207 08/28/20 0242 08/28/20 0406  BP: (!) 96/54  122/64 104/63  Pulse: 63     Resp: 14   18  Temp:  98.2 F (36.8 C) 97.9 F (36.6 C) 98 F (36.7 C)  TempSrc:   Oral Oral  SpO2: 96%  96% 97%  Weight:    62.4 kg  Height:       No intake or output data in the 24 hours ending 08/28/20 0845 Last 3 Weights 08/28/2020 08/27/2020 07/12/2020  Weight (lbs) 137 lb 8 oz 140 lb 141 lb 3.2 oz  Weight (kg) 62.37 kg 63.504 kg 64.048 kg      Telemetry    Sinus rhythm in the 60s - Personally Reviewed  ECG    No new tracings - Personally Reviewed  Physical Exam   GEN: No acute distress.   Neck: No JVD Cardiac: RRR, no murmurs, rubs, or gallops.  Respiratory: Clear to auscultation bilaterally. GI: Soft, nontender, non-distended  MS: No edema; No deformity. Neuro:  Nonfocal  Psych: Normal affect   Labs    High Sensitivity Troponin:   Recent Labs  Lab 08/27/20 1301 08/27/20 1652 08/27/20 2213 08/28/20 0328  TROPONINIHS 9 9 8 8       Chemistry Recent Labs  Lab 08/27/20 1301 08/27/20 2213 08/28/20 0328  NA 136  --  139  K 4.3  --  3.8  CL 105  --  109  CO2 21*  --  20*  GLUCOSE 94  --  100*  BUN 15  --  20  CREATININE 1.16*   --  1.20*  CALCIUM 9.0  --  8.9  PROT  --  6.5  --   ALBUMIN  --  3.6  --   AST  --  22  --   ALT  --  13  --   ALKPHOS  --  60  --   BILITOT  --  0.6  --   GFRNONAA 50*  --  48*  ANIONGAP 10  --  10     Hematology Recent Labs  Lab 08/27/20 1301 08/28/20 0328  WBC 6.5 7.2  RBC 4.27 3.60*  HGB 10.9* 9.5*  HCT 36.2 29.5*  MCV 84.8 81.9  MCH 25.5* 26.4  MCHC 30.1 32.2  RDW 15.1 15.3  PLT 307 250    BNPNo results for input(s): BNP, PROBNP in the last 168 hours.   DDimer No results for input(s): DDIMER in the last 168 hours.   Radiology    DG Chest 2 View  Result Date: 08/27/2020 CLINICAL DATA:  Chest pain EXAM: CHEST - 2 VIEW COMPARISON:  06/01/2020 FINDINGS: The heart size and mediastinal contours are within normal limits. Both lungs are  clear. The visualized skeletal structures are unremarkable. IMPRESSION: No active cardiopulmonary disease. Electronically Signed   By: Kennith Center M.D.   On: 08/27/2020 13:22    Cardiac Studies   ECHO: 07/12/2019 1. Left ventricular ejection fraction, by visual estimation, is 60 to  65%. The left ventricle has normal function. There is no left ventricular  hypertrophy.  2. Left ventricular diastolic parameters are indeterminate.  3. Global right ventricle has normal systolic function.The right  ventricular size is normal. No increase in right ventricular wall  thickness.  4. Left atrial size was mildly dilated.  5. Right atrial size was normal.  6. The mitral valve is normal in structure. Mild mitral valve  regurgitation.  7. The tricuspid valve is normal in structure. Tricuspid valve  regurgitation is not demonstrated.  8. The aortic valve is tricuspid. Aortic valve regurgitation is not  visualized. No evidence of aortic valve sclerosis or stenosis.  9. The pulmonic valve was grossly normal. Pulmonic valve regurgitation is  trivial.  10. TR signal is inadequate for assessing pulmonary artery systolic  pressure.   11. The inferior vena cava is normal in size with greater than 50%  respiratory variability, suggesting right atrial pressure of 3 mmHg.   CATH: 06/04/2020  Prox RCA to Mid RCA lesion is 99% stenosed. Mid RCA lesion is 95% stenosed.  Scoring balloon angioplasty was performed using a BALLOON WOLVERINE 3.00X10, followed by a 3.5  balloon, on both lesions.  Post intervention, there is a 0% residual stenosis.  The left ventricular systolic function is normal.  LV end diastolic pressure is normal.  The left ventricular ejection fraction is 55-65% by visual estimate.  There is no aortic valve stenosis.  Continue aggressive secondary prevention. Patient with some residual pain, likely related to some decreased flow in marginal branches.  Diagnostic Dominance: Right    Intervention      Patient Profile     75 y.o. female with a hx of inferior STEMIcomplicated by VF arrest(during cath)and cardiogenic shock in 07/2019 requiring DES x 2. Recurrent CP >> cath 06/04/2020 w/ severe ISR RCA s/p scoring balloon PTCA.Hx  HLD on Repatha, GERD, anxiety, depression, and OA  who is being seen today for the evaluation of chest pain  Assessment & Plan    Chest pain CAD - hs troponin 9 --> 9 --> 8 --> 8 - symptoms concerning for unstable angina relieved by nitro - will proceed with heart cath today - continue ASA and brilinta   Hyperlipidemia with LDL goal < 70 - on repatha, zetia, and zocor - 08/27/2020: Cholesterol 86; HDL 61; LDL Cholesterol 19; Triglycerides 32; VLDL 6 - excellent control of lipids   Hypertension - contniue 12.5 mg BID loperssor    For questions or updates, please contact [facility] Please consult [email or web address] for contact info under        Signed, Marcelino Duster, PA  08/28/2020, 8:45 AM

## 2020-08-29 DIAGNOSIS — I2510 Atherosclerotic heart disease of native coronary artery without angina pectoris: Secondary | ICD-10-CM

## 2020-08-29 DIAGNOSIS — Z9861 Coronary angioplasty status: Secondary | ICD-10-CM

## 2020-08-29 LAB — BASIC METABOLIC PANEL
Anion gap: 11 (ref 5–15)
BUN: 16 mg/dL (ref 8–23)
CO2: 18 mmol/L — ABNORMAL LOW (ref 22–32)
Calcium: 8.5 mg/dL — ABNORMAL LOW (ref 8.9–10.3)
Chloride: 111 mmol/L (ref 98–111)
Creatinine, Ser: 1.13 mg/dL — ABNORMAL HIGH (ref 0.44–1.00)
GFR, Estimated: 52 mL/min — ABNORMAL LOW (ref >60)
Glucose, Bld: 74 mg/dL (ref 70–99)
Potassium: 4.2 mmol/L (ref 3.5–5.1)
Sodium: 140 mmol/L (ref 135–145)

## 2020-08-29 LAB — CBC
HCT: 28.8 % — ABNORMAL LOW (ref 36.0–46.0)
Hemoglobin: 9.3 g/dL — ABNORMAL LOW (ref 12.0–15.0)
MCH: 26.6 pg (ref 26.0–34.0)
MCHC: 32.3 g/dL (ref 30.0–36.0)
MCV: 82.5 fL (ref 80.0–100.0)
Platelets: 241 10*3/uL (ref 150–400)
RBC: 3.49 MIL/uL — ABNORMAL LOW (ref 3.87–5.11)
RDW: 15.4 % (ref 11.5–15.5)
WBC: 7.7 10*3/uL (ref 4.0–10.5)
nRBC: 0 % (ref 0.0–0.2)

## 2020-08-29 LAB — HEPARIN LEVEL (UNFRACTIONATED): Heparin Unfractionated: <0.1 IU/mL — ABNORMAL LOW (ref 0.30–0.70)

## 2020-08-29 NOTE — Plan of Care (Signed)

## 2020-08-29 NOTE — Discharge Instructions (Signed)
Angina  Angina is discomfort in the chest, neck, arm, jaw, or back. The discomfort is caused by a lack of blood in the middle layer of the heart wall (myocardium). There are four types of angina:  Stable angina. This is triggered by vigorous activity or exercise. It goes away when you rest or take medicines that treat angina. This is diagnosed if you have had the symptom for more than 2 months.  Unstable angina. This is a warning sign and can lead to a heart attack. This is a medical emergency. Symptoms come at rest and last a long time.  Microvascular angina. This affects the small coronary arteries. Symptoms include chest pain, feeling tired, and being short of breath. The symptoms can last a long time or short time.  Prinzmetal or variant angina. This is caused by a spasm of the arteries that go to your heart. What are the causes? This condition is usually caused by atherosclerosis. This is the buildup of fat and cholesterol (plaque) in your arteries. The plaque may narrow or block the artery. Other causes of angina include:  Sudden spasms of the muscles of the arteries in the heart.  Small artery disease (microvascular dysfunction).  Problems with any of your heart valves.  A tear in an artery in your heart (coronary artery dissection).  Weakness of the heart muscle (cardiomyopathy). What increases the risk? You are more likely to develop this condition if you have:  High cholesterol.  High blood pressure.  Diabetes.  A family history of heart disease.  A sedentary lifestyle, or a lifestyle in which you do not exercise enough.  Depression.  Had radiation treatment to the left side of your chest. Other risk factors include:  Using tobacco.  Being obese.  Eating a diet high in saturated fats.  Being exposed to high stress or triggers of stress.  Using drugs, such as cocaine. Women have a greater risk for angina if they:  Are older than age 55.  Have gone  through menopause. What are the signs or symptoms? Common symptoms of this condition in both men and women may include:  Chest pain, which may: ? Feel like a crushing or squeezing in the chest, or a tightness, pressure, fullness, or heaviness in the chest. ? Last for more than a few minutes, or stop and come back over a few minutes.  Pain in the neck, arm, jaw, or back.  Unexplained heartburn or indigestion.  Shortness of breath.  Nausea.  Sudden cold sweats. Women and people with diabetes may have unusual (atypical) symptoms, such as:  Fatigue.  Unexplained feelings of nervousness or anxiety.  Unexplained weakness.  Dizziness or fainting. How is this diagnosed? This condition may be diagnosed based on:  Your symptoms and medical history.  Electrocardiogram (ECG) to measure the electrical activity in your heart.  Blood tests.  Stress test to look for signs of blockage when your heart is stressed.  CT angiogram to examine your heart and the blood flow to it.  Coronary angiogram to check for arterial blockage.  Echocardiogram (ultrasound) to assess the strength of your heartbeat. How is this treated? Angina may be treated with:  Medicines to: ? Prevent blood clots and heart attack. ? Relax blood vessels and improve blood flow to the heart. ? Reduce blood pressure, improve heart pumping, and relax blood vessels spasms. ? Reduce cholesterol and help treat atherosclerosis.  A procedure to widen a narrowed or blocked coronary artery (angioplasty). A mesh tube (stent) may   be placed in a coronary artery to keep it open.  Surgery to allow blood to go around a blocked artery (coronary artery bypass surgery). Follow these instructions at home: Medicines  Take over-the-counter and prescription medicines only as told by your health care provider.  Do not take the following medicines unless your health care provider approves: ? NSAIDs, such as ibuprofen or  naproxen. ? Vitamin supplements that contain vitamin A, vitamin E, or both. ? Hormone replacement therapy that contains estrogen with or without progestin. Eating and drinking  Eat a heart-healthy diet. This includes plenty of fresh fruits and vegetables, whole grains, low-fat (lean) protein, and low-fat dairy products.  Follow instructions from your health care provider about eating or drinking restrictions.   Activity  Follow an exercise program approved by your health care provider.  Consider joining a cardiac rehabilitation program.  Take a break when you feel fatigued. Plan rest periods in your daily activities. Lifestyle  Do not use any products that contain nicotine or tobacco. These products include cigarettes, chewing tobacco, and vaping devices, such as e-cigarettes. If you need help quitting, ask your health care provider.  If your health care provider says you can drink alcohol: ? Limit how much you have to:  0-1 drink a day for women who are not pregnant.  0-2 drinks a day for men. ? Be aware of how much alcohol is in your drink. In the [place] one drink equals one 12 oz bottle of beer (355 mL), one 5 oz glass of wine (148 mL), or one 1 oz glass of hard liquor (44 mL).   General instructions  Maintain a healthy weight.  Learn to manage stress.  Keep your vaccinations up to date. Get the flu (influenza) vaccine every year.  Talk to your health care provider if you feel depressed. Take a depression screening test to see if you are at risk for depression.  Work with your health care provider to manage other health conditions, such as hypertension or diabetes.  Keep all follow-up visits. This is important. Get help right away if:  You have pain in your chest, neck, arm, jaw, or back, and the pain: ? Lasts more than a few minutes. ? Is recurring. ? Is not relieved by taking medicines under the tongue (sublingual nitroglycerin). ? Increases in intensity or  frequency.  You have a lot of sweating without cause.  You have unexplained: ? Heartburn or indigestion. ? Shortness of breath or difficulty breathing. ? Nausea or vomiting. ? Fatigue. ? Feelings of nervousness or anxiety. ? Weakness.  You have sudden light-headedness or dizziness.  You faint. These symptoms may represent a serious problem that is an emergency. Do not wait to see if the symptoms will go away. Get medical help right away. Call your local emergency services (911 in the [place] Do not drive yourself to the hospital. Summary  Angina is discomfort in the chest, neck, arm, jaw, or back that is caused by a lack of blood in the arteries of the heart wall.  There are many symptoms of angina. They include chest pain, unexplained heartburn or indigestion, sudden cold sweats, and fatigue.  Angina may be treated with lifestyle changes, medicines, or surgery.  Symptoms of angina may represent an emergency. Get medical help right away. Call your local emergency services (911 in the [place] Do not drive yourself to the hospital. This information is not intended to replace advice given to you by your health care provider. Make sure you   discuss any questions you have with your health care provider. Document Revised: 01/19/2020 Document Reviewed: 01/19/2020 [employer] Patient Education  2021 [employer]  

## 2020-08-29 NOTE — Progress Notes (Signed)
Cardiology Office Note:    Date:  09/04/2020   ID:  Sarah Pacheco, DOB 06/29/1946, MRN [id number]  PCP:  Patient, No Pcp Per  Cardiologist:  Peter SwazilandJordan, MD   Referring MD: No ref. provider found   Chief Complaint  Patient presents with  . Follow-up    Follow-up Chest Soreness, Some Shortness of Breath  PCI  History of Present Illness:    Sarah Pacheco is a 75 y.o. female with a hx of CAD s/p inferior STEMI complicated by Vfib arrest durign cath and cardiogenic shock in 07/2019 requiring DES x 2. She also has HLD, GERD, anxiety, depression, and OA. Echo at the time of her MI with normal EF and no WMA. She had recurrent chest pan and repeat heart cath 06/04/20 with severe in-stent restenosis in RCA treated with scoring balloon PTCA. [facility]. HS troponin 8-9 range and EKG nonischemic. CP relieved with nitro. Repeat heart cath 08/28/20 with severe 2-site recurrent in-stent restenosis of proxima-distal RCA (95%, 75%) treated with scoring balloon and balloon angioplasty and 2 additional overlapping stents. She tolerated the procedure well. Lifelong DAPT was recommended. Due to improved cholesterol profile, zetia was discontinued.   She presents today for follow up. She reports significant soreness in her right arm. She had an achiness/soreness in her right arm yesterday and did take one nitro which helped with the pain. No chest pain or shortness of breath. No further arm pain, redness, unilateral swelling, or warmth. She is walking short distances and climbs a flight of stairs several times per day. She has been depressed given her ISR. [facility] she wants to stay close to home given her CAD, I think this is a good idea. She needs to establish with a PCP to work up her anemia.    Past Medical History:  Diagnosis Date  . Anxiety   . Arthritis   . CAD (coronary artery disease)    a. 07/2019 Inf STEMI/VF Arrest/CGS/PCI: LM nl,  LADmin irregs, LCX nl, RCA 100p/m (2.75x38 & 2.75x26 Resolute Onyx DESs).  . Cataract   . Chest pain   . Depression   . GERD (gastroesophageal reflux disease)   . History of echocardiogram    a. 07/2019 Echo: EF 60-65%, no rwma, nl RV size/fxn, mildly dil LA, mild MR.  Marland Kitchen. Hyperlipidemia with target LDL less than 70 10/25/2015    Past Surgical History:  Procedure Laterality Date  . APPENDECTOMY    . CATARACT EXTRACTION, BILATERAL    . COLONOSCOPY  2017   X2     colon polyps  . CORONARY BALLOON ANGIOPLASTY N/A 06/04/2020   Procedure: CORONARY BALLOON ANGIOPLASTY;  Surgeon: Corky CraftsVaranasi, Jayadeep S, MD;  [facility];  Service: Cardiovascular;  Laterality: N/A;  . CORONARY STENT INTERVENTION N/A 08/28/2020   Procedure: CORONARY STENT INTERVENTION;  Surgeon: Marykay LexHarding, David W, MD;  [facility];  Service: Cardiovascular;  Laterality: N/A;  . CORONARY/GRAFT ACUTE MI REVASCULARIZATION N/A 07/11/2019   Procedure: CORONARY/GRAFT ACUTE MI REVASCULARIZATION;  Surgeon: SwazilandJordan, Peter M, MD;  [facility];  Service: Cardiovascular;  Laterality: N/A;  . INTRAVASCULAR ULTRASOUND/IVUS N/A 06/04/2020   Procedure: Intravascular Ultrasound/IVUS;  Surgeon: Corky CraftsVaranasi, Jayadeep S, MD;  [facility];  Service: Cardiovascular;  Laterality: N/A;  . LAPAROTOMY N/A 10/10/2018   Procedure: EXPLORATORY LAPAROTOMY RIGHT COLECTOMY;  Surgeon: Emelia LoronWakefield, Matthew, MD;  Location: [facility] ORS;  Service: General;  Laterality: N/A;  . LEFT HEART CATH AND CORONARY ANGIOGRAPHY N/A 07/11/2019   Procedure: LEFT HEART CATH AND CORONARY ANGIOGRAPHY;  Surgeon: Swaziland, Peter M, MD;  Location: [facility] INVASIVE CV LAB;  Service: Cardiovascular;  Laterality: N/A;  . LEFT HEART CATH AND CORONARY ANGIOGRAPHY N/A 06/04/2020   Procedure: LEFT HEART CATH AND CORONARY ANGIOGRAPHY;  Surgeon: Corky Crafts, MD;  Location: [facility] INVASIVE CV LAB;  Service: Cardiovascular;  Laterality: N/A;  . LEFT  HEART CATH AND CORONARY ANGIOGRAPHY N/A 08/28/2020   Procedure: LEFT HEART CATH AND CORONARY ANGIOGRAPHY;  Surgeon: Marykay Lex, MD;  Location: [facility] INVASIVE CV LAB;  Service: Cardiovascular;  Laterality: N/A;  . TEMPORARY PACEMAKER N/A 07/11/2019   Procedure: TEMPORARY PACEMAKER;  Surgeon: Swaziland, Peter M, MD;  Location: [facility] INVASIVE CV LAB;  Service: Cardiovascular;  Laterality: N/A;  . VAGINAL HYSTERECTOMY  1973   AUB, fibroids    Current Medications: No outpatient medications have been marked as taking for the 09/04/20 encounter (Office Visit) with Marcelino Duster, PA.     Allergies:   Atorvastatin, Codeine, Erythromycin base, Penicillin g, and Rosuvastatin   Social History   Socioeconomic History  . Marital status: Divorced    Spouse name: Not on file  . Number of children: Not on file  . Years of education: Not on file  . Highest education level: Not on file  Occupational History  . Not on file  Tobacco Use  . Smoking status: Former Smoker    Packs/day: 0.50    Years: 15.00    Pack years: 7.50    Types: Cigarettes  . Smokeless tobacco: Never Used  . Tobacco comment: Quit around age 81  Vaping Use  . Vaping Use: Never used  Substance and Sexual Activity  . Alcohol use: No  . Drug use: No  . Sexual activity: Not on file  Other Topics Concern  . Not on file  Social History Narrative   Lives locally with husband.   Social Determinants of Health   Financial Resource Strain: Not on file  Food Insecurity: Not on file  Transportation Needs: Not on file  Physical Activity: Not on file  Stress: Not on file  Social Connections: Not on file     Family History: The patient's family history includes COPD in her brother; Colon cancer (age of onset: 8) in her brother and sister; Colon polyps in her brother and sister; Heart attack in her sister; Heart attack (age of onset: 10) in her brother; Heart attack (age of onset: 40) in her father; Heart disease in her brother,  father, sister, and sister; Heart failure in her brother; Lung cancer in her brother. There is no history of Esophageal cancer, Rectal cancer, or Stomach cancer.  ROS:   Please see the history of present illness.     All other systems reviewed and are negative.  EKGs/Labs/Other Studies Reviewed:    The following studies were reviewed today:  Left heart cath 08/28/20:  The left ventricular systolic function is normal. The left ventricular ejection fraction is 50-55% by visual estimate.  LV end diastolic pressure is low.  --------------------  Proximal RCA stent is 95% stenosed, in-stent restenosis (subsequent)  Mid-distal RCA stent is 75% stenosed.-In-stent restenosis (subsequent)  Scoring balloon angioplasty was performed on both lesions using a BALLOON WOLVERINE 3.00X15.  Noncompliant balloon angioplasty balloon angioplasty was performed using a 3.5 mm by 15 mm Sunol balloon.  A drug-eluting stent was successfully placed open from the  distal stent edge to the mid stented segment, using a SYNERGY XD 3.50X38. Postdilated to 3.8 mm  A second drug-eluting stent was successfully placed overlapping the original stent and the new stent for the proximal segment, using a SYNERGY XD 3.50X28. Postdilated to 3.8 mm  Post intervention, there is a 0% residual stenosis throughout the entire stented segment (the entire old stent segment was covered with new stents).  --------------------------------------  Dist RCA lesion is 20% stenosed.  Otherwise minimal LCA disease.  SUMMARY  Severe 2 site recurrent in-stent restenosis of the extensive proximal to distal RCA stents -> proximal-mid 95% ISR, distal stent 75% ISR ? Successful Scoring Balloon followed by [place] Balloon Angioplasty lesion preparation for 2 additional overlapping stents placed covering the 2 old stents using Synergy DES 3.5 mm x 38 and 3.5 mm by 28 mm stent postdilated to 3.8 mm.  Relatively normal left coronary arteries-no  change  Normal LV function and EDP.   RECOMMENDATIONS  Return to nursing unit for ongoing care. Anticipate discharge in the morning.  [facility] reconsulted  Continue aggressive risk modification  Lifelong Thienopyridine antiplatelet agent.   EKG:  EKG is  ordered today.  The ekg ordered today demonstrates sinus bradycardia HR 54  Recent Labs: 06/03/2020: Magnesium 2.2 08/27/2020: ALT 13; TSH 1.941 08/29/2020: BUN 16; Creatinine, Ser 1.13; Hemoglobin 9.3; Platelets 241; Potassium 4.2; Sodium 140  Recent Lipid Panel    Component Value Date/Time   CHOL 86 08/27/2020 2213   CHOL 175 05/20/2020 0928   TRIG 32 08/27/2020 2213   HDL 61 08/27/2020 2213   HDL 55 05/20/2020 0928   CHOLHDL 1.4 08/27/2020 2213   VLDL 6 08/27/2020 2213   LDLCALC 19 08/27/2020 2213   LDLCALC 97 05/20/2020 0928    Physical Exam:    VS:  BP 125/75 (BP Location: Left Arm, Patient Position: Sitting)   Pulse (!) 54   Ht 5\' 3"  (1.6 m)   Wt 140 lb (63.5 kg)   SpO2 99%   BMI 24.80 kg/m     Wt Readings from Last 3 Encounters:  09/04/20 140 lb (63.5 kg)  08/29/20 139 lb 3.2 oz (63.1 kg)  07/12/20 141 lb 3.2 oz (64 kg)     GEN: Well nourished, well developed in no acute distress HEENT: Normal NECK: No JVD; No carotid bruits LYMPHATICS: No lymphadenopathy CARDIAC: RRR, no murmurs, rubs, gallops RESPIRATORY:  Clear to auscultation without rales, wheezing or rhonchi  ABDOMEN: Soft, non-tender, non-distended MUSCULOSKELETAL:  No edema; No deformity  SKIN: Warm and dry NEUROLOGIC:  Alert and oriented x 3 PSYCHIATRIC:  Normal affect  Right ulnar access site C/D/I with bruising on forearm  ASSESSMENT:    1. Coronary artery disease involving native coronary artery of native heart without angina pectoris   2. Anemia, unspecified type   3. Hyperlipidemia with target LDL less than 70   4. Primary hypertension   5. Right arm pain    PLAN:    In order of problems listed above:  CAD Hx of  inferior STEMI Hx of ISR of RCA x 2 episodes - continue ASA and brilinta - no bleeding, but persistent anemia - no chest pain - encouraged walking as her body permits   Hyperlipidemia with LDL goal < 70 08/27/2020: Cholesterol 86; HDL 61; LDL Cholesterol 19; Triglycerides 32; VLDL 6 Continue repatha and zocor.  - no changes   Hypertension Continue BB - well controlled   Anemia  Hb at discharge was 9.3 - near  baseline.  - will repeat CBC today to make sure  - she will establish with a PCP   Right arm pain - bruising noted on forearm as expected following ulnar access - I do not appreciate redness, swelling, or warmth in her upper or lower arm, low suspicion for DVT  - she will contact us if it happens again    Medication Adjustments/Labs and Tests Ordered: Current medicines are reviewed at length with the patient today.  Concerns regarding medicines are outlined above.  Orders Placed This Encounter  Procedures  . CBC  . Basic metabolic panel  . EKG 12-Lead   No orders of the defined types were placed in this encounter.   Signed, Marcelino Duster, [place]  09/04/2020 10:09 AM    [facility]

## 2020-08-29 NOTE — Progress Notes (Signed)
CARDIAC REHAB PHASE I   PRE:  Rate/Rhythm: 63 SR  BP:  Supine: 128/59  Sitting:   Standing:    SaO2: 96%RA  MODE:  Ambulation: 800 ft   POST:  Rate/Rhythm: 84 SR  BP:  Supine: 125/57  Sitting:   Standing:    SaO2: 99%RA 0850-0930 Pt walked 800 ft on RA with steady gait and no CP. Tolerated well. Brief ed done as just saw pt in October and had educated then. Pt stated she felt that her breathing was better. Stated she would not have been able to walk that far before PCI. Reviewed importance of brilinta, NTG use, gave heart healthy diet and walking instructions. Will send referral letter to [facility] 2. Pt stated she did not attend after OCT admission as she never felt well enough. Hopefully can attend now.   Luetta Nutting, RN BSN  08/29/2020 9:27 AM

## 2020-08-29 NOTE — Discharge Summary (Signed)
Discharge Summary    Patient ID: Sarah Pacheco MRN: [id number]; DOB: [birth year]  Admit date: 08/27/2020 Discharge date: 08/29/2020  Primary Care Provider: Patient, No Pcp Per  Primary Cardiologist: Peter Swaziland, MD  Primary Electrophysiologist:  None   Discharge Diagnoses    Principal Problem:   CAD S/P percutaneous coronary angioplasty Active Problems:   Hyperlipidemia with target LDL less than 70   Mild anemia   Pre-diabetes   Unstable angina (HCC)   Coronary stent restenosis due to scar tissue    Diagnostic Studies/Procedures    Left heart cath 08/28/20:  The left ventricular systolic function is normal. The left ventricular ejection fraction is 50-55% by visual estimate.  LV end diastolic pressure is low.  --------------------  Proximal RCA stent is 95% stenosed, in-stent restenosis (subsequent)  Mid-distal RCA stent is 75% stenosed.-In-stent restenosis (subsequent)  Scoring balloon angioplasty was performed on both lesions using a BALLOON WOLVERINE 3.00X15.  Noncompliant balloon angioplasty balloon angioplasty was performed using a 3.5 mm by 15 mm [place] balloon.  A drug-eluting stent was successfully placed open from the distal stent edge to the mid stented segment, using a SYNERGY XD 3.50X38. Postdilated to 3.8 mm  A second drug-eluting stent was successfully placed overlapping the original stent and the new stent for the proximal segment, using a SYNERGY XD 3.50X28. Postdilated to 3.8 mm  Post intervention, there is a 0% residual stenosis throughout the entire stented segment (the entire old stent segment was covered with new stents).  --------------------------------------  Dist RCA lesion is 20% stenosed.  Otherwise minimal LCA disease.   SUMMARY  Severe 2 site recurrent in-stent restenosis of the extensive proximal to distal RCA stents -> proximal-mid 95% ISR, distal stent 75% ISR ? Successful Scoring Balloon followed by [place] Balloon Angioplasty lesion  preparation for 2 additional overlapping stents placed covering the 2 old stents using Synergy DES 3.5 mm x 38 and 3.5 mm by 28 mm stent postdilated to 3.8 mm.  Relatively normal left coronary arteries-no change  Normal LV function and EDP.   RECOMMENDATIONS  Return to nursing unit for ongoing care.  Anticipate discharge in the morning.  [facility] reconsulted  Continue aggressive risk modification  Lifelong Thienopyridine antiplatelet agent.   _____________   History of Present Illness     Sarah Pacheco is a 75 y.o. female with a hx of inferior STEMIcomplicated by VF arrest(during cath)and cardiogenic shock in 07/2019 requiring DES x 2. Recurrent CP >> cath 06/04/2020 w/ severe ISR RCA s/p scoring balloon PTCA.Hx  HLD on Repatha, GERD, anxiety, depression, and OA.  She was last seen by Dr. Swaziland 07/12/20 and was doing well at that time.   Unfortunately, she presented to [facility] with worsening chest pain. Sarah Pacheco was having some minor chest pains ever since her cath. However, over the last week or 2, she has had consistent CP w/ exertion. It is a burning pain that radiates to both arms. Similar to her MI pain, but not as severe. She had an episode of pain last pm that was worse than the others. It reached a 6-7/10. She took 3 SL NTG with relief of the pain.   She feels that she cannot do anything without getting the pain.  Walking room to room, she will get symptoms.  She has not had any pain that started at rest, but gets pain virtually every time she walks around.  She was concerned about her symptoms and came to the ER.  In the  ER, she had recurrent pain when she walked to the bathroom.  She still has not had resting pain.  As long as she lies still, she is comfortable.    Hospital Course     Consultants: none  Chest pain CAD HS troponin 9 --> 9 --> 8 --> 8 EKG was nonischemic Symptoms were consistent with unstable angina relieved with nitro. Repeat heart catheterization  08/28/20 showed severe 2-site recurrent in-stent restenosis of proximal-distal RCA (95%, 75%) treated with scoring balloon and balloon angioplasty and 2 additional overlapping stents. She tolerated the procedure well. She ambulated without issues and has not had a recurrence of chest pain. Continue ASA and brilinta.   Hyperlipidemia with LDL goal < 70 08/27/2020: Cholesterol 86; HDL 61; LDL Cholesterol 19; Triglycerides 32; VLDL 6 Continue repatha and zocor. D/C'ed zetia given excellent lipid control.   Hypertension Continue 12.5 mg BID lopressor. BP well-controlled.   Anemia Hb 9.3 - near her baseline. No active bleeding.   Prediabetes A1c 5.8% (07/11/19) Need repeat A1c - follow with PCP.     Did the patient have an acute coronary syndrome (MI, NSTEMI, STEMI, etc) this admission?:  No                               Did the patient have a percutaneous coronary intervention (stent / angioplasty)?:  Yes.     Cath/PCI Registry Performance & Quality Measures: 1. Aspirin prescribed? - Yes 2. ADP Receptor Inhibitor (Plavix/Clopidogrel, Brilinta/Ticagrelor or Effient/Prasugrel) prescribed (includes medically managed patients)? - Yes 3. High Intensity Statin (Lipitor 40-80mg  or Crestor 20-40mg ) prescribed? - Yes 4. For EF <40%, was ACEI/ARB prescribed? - Not Applicable (EF >/= 40%) 5. For EF <40%, Aldosterone Antagonist (Spironolactone or Eplerenone) prescribed? - Not Applicable (EF >/= 40%) 6. Cardiac Rehab Phase II ordered? - Yes       _____________  Discharge Vitals Blood pressure (!) 128/53, pulse 67, temperature 97.9 F (36.6 C), temperature source Oral, resp. rate 15, height 5\' 3"  (1.6 m), weight 63.1 kg, SpO2 97 %.  Filed Weights   08/27/20 1253 08/28/20 0406 08/29/20 0614  Weight: 63.5 kg 62.4 kg 63.1 kg   Physical Exam Constitutional:      General: She is not in acute distress.    Appearance: Normal appearance.  HENT:     Head: Normocephalic and atraumatic.  Neck:      Vascular: No carotid bruit.  Cardiovascular:     Rate and Rhythm: Normal rate and regular rhythm.     Pulses: Normal pulses.     Heart sounds: Normal heart sounds. No murmur heard.   Pulmonary:     Effort: Pulmonary effort is normal. No respiratory distress.     Breath sounds: Normal breath sounds. No wheezing.  Abdominal:     General: Bowel sounds are normal.  Musculoskeletal:     Right lower leg: No edema.     Left lower leg: No edema.  Skin:    General: Skin is warm and dry.  Neurological:     General: No focal deficit present.     Mental Status: She is alert and oriented to person, place, and time.  Psychiatric:        Mood and Affect: Mood normal.    Right radial cath site C/D/I   Labs & Radiologic Studies    CBC Recent Labs    08/28/20 0328 08/29/20 0250  WBC 7.2 7.7  HGB 9.5* 9.3*  HCT 29.5* 28.8*  MCV 81.9 82.5  PLT 250 241   Basic Metabolic Panel Recent Labs    78/29/5601/19/22 0328 08/29/20 0250  NA 139 140  K 3.8 4.2  CL 109 111  CO2 20* 18*  GLUCOSE 100* 74  BUN 20 16  CREATININE 1.20* 1.13*  CALCIUM 8.9 8.5*   Liver Function Tests Recent Labs    08/27/20 2213  AST 22  ALT 13  ALKPHOS 60  BILITOT 0.6  PROT 6.5  ALBUMIN 3.6   No results for input(s): LIPASE, AMYLASE in the last 72 hours. High Sensitivity Troponin:   Recent Labs  Lab 08/27/20 1301 08/27/20 1652 08/27/20 2213 08/28/20 0328  TROPONINIHS 9 9 8 8     BNP Invalid input(s): POCBNP D-Dimer No results for input(s): DDIMER in the last 72 hours. Hemoglobin A1C No results for input(s): HGBA1C in the last 72 hours. Fasting Lipid Panel Recent Labs    08/27/20 2213  CHOL 86  HDL 61  LDLCALC 19  TRIG 32  CHOLHDL 1.4   Thyroid Function Tests Recent Labs    08/27/20 2213  TSH 1.941   _____________  DG Chest 2 View  Result Date: 08/27/2020 CLINICAL DATA:  Chest pain EXAM: CHEST - 2 VIEW COMPARISON:  06/01/2020 FINDINGS: The heart size and mediastinal contours are  within normal limits. Both lungs are clear. The visualized skeletal structures are unremarkable. IMPRESSION: No active cardiopulmonary disease. Electronically Signed   By: Kennith CenterEric  Mansell M.D.   On: 08/27/2020 13:22   CARDIAC CATHETERIZATION  Result Date: 08/28/2020  The left ventricular systolic function is normal. The left ventricular ejection fraction is 50-55% by visual estimate.  LV end diastolic pressure is low.  --------------------  Proximal RCA stent is 95% stenosed, in-stent restenosis (subsequent)  Mid-distal RCA stent is 75% stenosed.-In-stent restenosis (subsequent)  Scoring balloon angioplasty was performed on both lesions using a BALLOON WOLVERINE 3.00X15.  Noncompliant balloon angioplasty balloon angioplasty was performed using a 3.5 mm by 15 mm [place] balloon.  A drug-eluting stent was successfully placed open from the distal stent edge to the mid stented segment, using a SYNERGY XD 3.50X38. Postdilated to 3.8 mm  A second drug-eluting stent was successfully placed overlapping the original stent and the new stent for the proximal segment, using a SYNERGY XD 3.50X28. Postdilated to 3.8 mm  Post intervention, there is a 0% residual stenosis throughout the entire stented segment (the entire old stent segment was covered with new stents).  --------------------------------------  Dist RCA lesion is 20% stenosed.  Otherwise minimal LCA disease.  SUMMARY  Severe 2 site recurrent in-stent restenosis of the extensive proximal to distal RCA stents -> proximal-mid 95% ISR, distal stent 75% ISR  Successful Scoring Balloon followed by [place] Balloon Angioplasty lesion preparation for 2 additional overlapping stents placed covering the 2 old stents using Synergy DES 3.5 mm x 38 and 3.5 mm by 28 mm stent postdilated to 3.8 mm.  Relatively normal left coronary arteries-no change  Normal LV function and EDP. RECOMMENDATIONS  Return to nursing unit for ongoing care.  Anticipate discharge in the morning.   [facility] reconsulted  Continue aggressive risk modification  Lifelong Thienopyridine antiplatelet agent. Sarah Lemmaavid Harding, MD  Disposition   Pt is being discharged home today in good condition.  Follow-up Plans & Appointments     Follow-up Information    Chrystie NoseHilty, Kenneth C, MD Follow up on 09/04/2020.   Specialty: Cardiology Why: 11am toc and lipid clinic Contact information: [address] [place] [address] [phone]                Discharge Medications   Allergies as of 08/29/2020      Reactions   Atorvastatin Other (See Comments)   Cramps in legs   Codeine Nausea And Vomiting   Erythromycin Base Nausea Only   Penicillin G Hives   Did it involve swelling of the face/tongue/throat, SOB, or low BP? No Did it involve sudden or severe rash/hives, skin peeling, or any reaction on the inside of your mouth or nose? Yes Did you need to seek medical attention at a hospital or doctor's office? Yes When did it last happen?2010 If all above answers are "NO", may proceed with cephalosporin use.   Rosuvastatin Other (See Comments)   Muscle pain      Medication List    STOP taking these medications   ezetimibe 10 MG tablet Commonly known as: ZETIA     TAKE these medications   acetaminophen 500 MG tablet Commonly known as: TYLENOL Take 1,000 mg by mouth every 6 (six) hours as needed for mild pain or headache.   aspirin 81 MG EC tablet Take 1 tablet (81 mg total) by mouth daily.   EQL Omega 3 Fish Oil 1200 MG Caps Take 1,200 mg by mouth daily.   escitalopram 10 MG tablet Commonly known as: Lexapro Take 1 tablet (10 mg total) by mouth daily.   metoprolol tartrate 25 MG tablet Commonly known as: LOPRESSOR Take 0.5 tablets (12.5 mg total) by mouth 2 (two) times daily.   nitroGLYCERIN 0.4 MG SL tablet Commonly known as: NITROSTAT Place 1 tablet (0.4 mg total) under the tongue every 5 (five) minutes as needed for chest pain (up to 3 doses. If taking  3rd dose call 911).   pantoprazole 40 MG tablet Commonly known as: PROTONIX Take 1 tablet (40 mg total) by mouth daily.   Repatha SureClick 140 MG/ML Soaj Generic drug: Evolocumab Inject 1 Dose into the skin every 14 (fourteen) days.   simvastatin 40 MG tablet Commonly known as: ZOCOR Take 1 tablet (40 mg total) by mouth daily.   ticagrelor 90 MG Tabs tablet Commonly known as: BRILINTA Take 1 tablet (90 mg total) by mouth 2 (two) times daily.   Vitamin B-12 5000 MCG Tbdp Take 5,000 mcg by mouth daily.          Outstanding Labs/Studies   Follow lipid clinic  Duration of Discharge Encounter   Greater than 30 minutes including physician time.  Signed, Roe Rutherford Jashayla Glatfelter, PA 08/29/2020, 8:57 AM

## 2020-08-30 ENCOUNTER — Telehealth (HOSPITAL_COMMUNITY): Payer: Self-pay

## 2020-08-30 NOTE — Telephone Encounter (Signed)
Per phase I, faxed cardiac rehab referral to [facility].

## 2020-09-04 ENCOUNTER — Encounter: Payer: Self-pay | Admitting: Physician Assistant

## 2020-09-04 ENCOUNTER — Ambulatory Visit: Payer: Medicare Other | Admitting: Physician Assistant

## 2020-09-04 ENCOUNTER — Other Ambulatory Visit: Payer: Self-pay

## 2020-09-04 ENCOUNTER — Ambulatory Visit: Payer: Medicare Other | Admitting: Internal Medicine

## 2020-09-04 VITALS — BP 125/75 | HR 54 | Ht 63.0 in | Wt 140.0 lb

## 2020-09-04 DIAGNOSIS — E785 Hyperlipidemia, unspecified: Secondary | ICD-10-CM

## 2020-09-04 DIAGNOSIS — I2510 Atherosclerotic heart disease of native coronary artery without angina pectoris: Secondary | ICD-10-CM

## 2020-09-04 DIAGNOSIS — I10 Essential (primary) hypertension: Secondary | ICD-10-CM

## 2020-09-04 DIAGNOSIS — D649 Anemia, unspecified: Secondary | ICD-10-CM | POA: Diagnosis not present

## 2020-09-04 DIAGNOSIS — M79601 Pain in right arm: Secondary | ICD-10-CM

## 2020-09-04 NOTE — Patient Instructions (Signed)
Medication Instructions:  No Changes *If you need a refill on your cardiac medications before your next appointment, please call your pharmacy*   Lab Work: [facility] If you have labs (blood work) drawn today and your tests are completely normal, you will receive your results only by: Marland Kitchen MyChart Message (if you have MyChart) OR . A paper copy in the mail If you have any lab test that is abnormal or we need to change your treatment, we will call you to review the results.   Testing/Procedures: No Testing   Follow-Up: At [facility], you and your health needs are our priority.  As part of our continuing mission to provide you with exceptional heart care, we have created designated Provider Care Teams.  These Care Teams include your primary Cardiologist (physician) and Advanced Practice Providers (APPs -  Physician Assistants and Nurse Practitioners) who all work together to provide you with the care you need, when you need it.  Your next appointment:   1 month(s)  The format for your next appointment:   In Person  Provider:   Peter Swaziland, MD

## 2020-09-05 LAB — CBC
Hematocrit: 33.1 % — ABNORMAL LOW (ref 34.0–46.6)
Hemoglobin: 10.4 g/dL — ABNORMAL LOW (ref 11.1–15.9)
MCH: 26.1 pg — ABNORMAL LOW (ref 26.6–33.0)
MCHC: 31.4 g/dL — ABNORMAL LOW (ref 31.5–35.7)
MCV: 83 fL (ref 79–97)
Platelets: 326 10*3/uL (ref 150–450)
RBC: 3.98 x10E6/uL (ref 3.77–5.28)
RDW: 14.4 % (ref 11.7–15.4)
WBC: 6 10*3/uL (ref 3.4–10.8)

## 2020-09-05 LAB — BASIC METABOLIC PANEL
BUN/Creatinine Ratio: 11 — ABNORMAL LOW (ref 12–28)
BUN: 11 mg/dL (ref 8–27)
CO2: 21 mmol/L (ref 20–29)
Calcium: 9.3 mg/dL (ref 8.7–10.3)
Chloride: 107 mmol/L — ABNORMAL HIGH (ref 96–106)
Creatinine, Ser: 1.02 mg/dL — ABNORMAL HIGH (ref 0.57–1.00)
GFR calc Af Amer: 64 mL/min/{1.73_m2} (ref >59)
GFR calc non Af Amer: 55 mL/min/{1.73_m2} — ABNORMAL LOW (ref >59)
Glucose: 83 mg/dL (ref 65–99)
Potassium: 5.2 mmol/L (ref 3.5–5.2)
Sodium: 142 mmol/L (ref 134–144)

## 2020-10-06 NOTE — Progress Notes (Signed)
Cardiology Office Note   Date:  10/06/2020   ID:  Kristie, Bracewell 12/17/1945, MRN [id number]  PCP:  Patient, No Pcp Per  Cardiologist:  Dr. Swaziland    No chief complaint on file.     History of Present Illness: Sarah Pacheco is a 75 y.o. female who presents for follow up of CAD.   PMH of CAD s/p inferior STEMI complicated by VF arrest(during cath)and cardiogenic shock in 07/2019 requiring DES x 2. Other h/o includes HL, GERD, anxiety, depression, and OA. Echo at the time of her MI showed nl LV fxn w/o rwma.   In October shebegan to experience intermittent substernal chest discomfort that was most notable while lying down and also with activity. When occurring at rest, symptoms did seem to improve with sitting up or sublingual nitroglycerin. Exertional symptoms eased with rest. he presented to the emergency department October 23 where troponins were normal x2. ECG showed subtle inferolateral ST depression. She was pain-free in the emergency department and was subsequently discharged home with plan for outpatient cardiology follow-up. Unfortunately,  she  continued to have intermittent rest and exertional substernal chest heaviness associated with occasional diaphoresis as well as radiation to the shoulders and down bilateral arms to the fingers. Symptoms are very reminiscent of angina prior to MI.  She was admitted and underwent cath.   Cardiac cath showed severe in stent restenosis of the RCA. She had scoring balloon PTCA. This yielded and excellent result angiographically and by IVUS.  Ef 55-65%  Continue aggressive secondary prevention. She has since had follow up in lipid clinic with Dr Rennis Golden and started on PCSK 9 inhibitor- Repatha. She was also started on lexapro for symptoms of depression.  She presented back to [facility] in January with worsening chest pain. HS troponin 8-9 range and EKG nonischemic. CP relieved with nitro. Repeat heart cath 08/28/20 with severe 2-site  recurrent in-stent restenosis of proxima-distal RCA (95%, 75%) treated with scoring balloon and balloon angioplasty and 2 additional overlapping stents. She tolerated the procedure well. Lifelong DAPT was recommended. Due to improved cholesterol profile, zetia was discontinued.   On follow up today she is still struggling mentally with the events since December. She notes a burning sensation in her chest intermittently. Not exertional. Different than prior angina. Notes numbness and tingling in her hands and feet. Feels dizzy when bending over. Is anxious about starting Cardiac Rehab or doing any traveling.     Past Medical History:  Diagnosis Date  . Anxiety   . Arthritis   . CAD (coronary artery disease)    a. 07/2019 Inf STEMI/VF Arrest/CGS/PCI: LM nl, LADmin irregs, LCX nl, RCA 100p/m (2.75x38 & 2.75x26 Resolute Onyx DESs).  . Cataract   . Chest pain   . Depression   . GERD (gastroesophageal reflux disease)   . History of echocardiogram    a. 07/2019 Echo: EF 60-65%, no rwma, nl RV size/fxn, mildly dil LA, mild MR.  Marland Kitchen Hyperlipidemia with target LDL less than 70 10/25/2015    Past Surgical History:  Procedure Laterality Date  . APPENDECTOMY    . CATARACT EXTRACTION, BILATERAL    . COLONOSCOPY  2017   X2     colon polyps  . CORONARY BALLOON ANGIOPLASTY N/A 06/04/2020   Procedure: CORONARY BALLOON ANGIOPLASTY;  Surgeon: Corky Crafts, MD;  Location: [facility] INVASIVE CV LAB;  Service: Cardiovascular;  Laterality: N/A;  . CORONARY STENT INTERVENTION N/A 08/28/2020   Procedure: CORONARY STENT INTERVENTION;  Surgeon:  Marykay Lex, MD;  Location: [facility] INVASIVE CV LAB;  Service: Cardiovascular;  Laterality: N/A;  . CORONARY/GRAFT ACUTE MI REVASCULARIZATION N/A 07/11/2019   Procedure: CORONARY/GRAFT ACUTE MI REVASCULARIZATION;  Surgeon: Swaziland, Mahima Hottle M, MD;  Location: [facility] INVASIVE CV LAB;  Service: Cardiovascular;  Laterality: N/A;  . INTRAVASCULAR ULTRASOUND/IVUS N/A 06/04/2020    Procedure: Intravascular Ultrasound/IVUS;  Surgeon: Corky Crafts, MD;  Location: [facility] INVASIVE CV LAB;  Service: Cardiovascular;  Laterality: N/A;  . LAPAROTOMY N/A 10/10/2018   Procedure: EXPLORATORY LAPAROTOMY RIGHT COLECTOMY;  Surgeon: Emelia Loron, MD;  Location: [facility] ORS;  Service: General;  Laterality: N/A;  . LEFT HEART CATH AND CORONARY ANGIOGRAPHY N/A 07/11/2019   Procedure: LEFT HEART CATH AND CORONARY ANGIOGRAPHY;  Surgeon: Swaziland, Kailash Hinze M, MD;  Location: [facility] INVASIVE CV LAB;  Service: Cardiovascular;  Laterality: N/A;  . LEFT HEART CATH AND CORONARY ANGIOGRAPHY N/A 06/04/2020   Procedure: LEFT HEART CATH AND CORONARY ANGIOGRAPHY;  Surgeon: Corky Crafts, MD;  Location: [facility] INVASIVE CV LAB;  Service: Cardiovascular;  Laterality: N/A;  . LEFT HEART CATH AND CORONARY ANGIOGRAPHY N/A 08/28/2020   Procedure: LEFT HEART CATH AND CORONARY ANGIOGRAPHY;  Surgeon: Marykay Lex, MD;  Location: [facility] INVASIVE CV LAB;  Service: Cardiovascular;  Laterality: N/A;  . TEMPORARY PACEMAKER N/A 07/11/2019   Procedure: TEMPORARY PACEMAKER;  Surgeon: Swaziland, Kaicen Desena M, MD;  Location: [facility] INVASIVE CV LAB;  Service: Cardiovascular;  Laterality: N/A;  . VAGINAL HYSTERECTOMY  1973   AUB, fibroids     Current Outpatient Medications  Medication Sig Dispense Refill  . acetaminophen (TYLENOL) 500 MG tablet Take 1,000 mg by mouth every 6 (six) hours as needed for mild pain or headache.     Marland Kitchen aspirin EC 81 MG EC tablet Take 1 tablet (81 mg total) by mouth daily. 90 tablet 3  . Cyanocobalamin (VITAMIN B-12) 5000 MCG TBDP Take 5,000 mcg by mouth daily.     Marland Kitchen escitalopram (LEXAPRO) 10 MG tablet Take 1 tablet (10 mg total) by mouth daily. 90 tablet 3  . Evolocumab (REPATHA SURECLICK) 140 MG/ML SOAJ Inject 1 Dose into the skin every 14 (fourteen) days. 6 mL 3  . metoprolol tartrate (LOPRESSOR) 25 MG tablet Take 0.5 tablets (12.5 mg total) by mouth 2 (two) times daily. 90 tablet 3  . nitroGLYCERIN (NITROSTAT)  0.4 MG SL tablet Place 1 tablet (0.4 mg total) under the tongue every 5 (five) minutes as needed for chest pain (up to 3 doses. If taking 3rd dose call 911). 25 tablet 3  . Omega-3 Fatty Acids (EQL OMEGA 3 FISH OIL) 1200 MG CAPS Take 1,200 mg by mouth daily.    . pantoprazole (PROTONIX) 40 MG tablet Take 1 tablet (40 mg total) by mouth daily. 30 tablet 6  . simvastatin (ZOCOR) 40 MG tablet Take 1 tablet (40 mg total) by mouth daily. 90 tablet 3  . ticagrelor (BRILINTA) 90 MG TABS tablet Take 1 tablet (90 mg total) by mouth 2 (two) times daily. 180 tablet 3   No current facility-administered medications for this visit.    Allergies:   Atorvastatin, Codeine, Erythromycin base, Penicillin g, and Rosuvastatin    Social History:  The patient  reports that she has quit smoking. Her smoking use included cigarettes. She has a 7.50 pack-year smoking history. She has never used smokeless tobacco. She reports that she does not drink alcohol and does not use drugs.   Family History:  The patient's family history includes COPD in her brother; Colon  cancer (age of onset: 61) in her brother and sister; Colon polyps in her brother and sister; Heart attack in her sister; Heart attack (age of onset: 59) in her brother; Heart attack (age of onset: 48) in her father; Heart disease in her brother, father, sister, and sister; Heart failure in her brother; Lung cancer in her brother.   ROS:  As noted in HPI. All other systems are reviewed and are negative.   Wt Readings from Last 3 Encounters:  09/04/20 140 lb (63.5 kg)  08/29/20 139 lb 3.2 oz (63.1 kg)  07/12/20 141 lb 3.2 oz (64 kg)     PHYSICAL EXAM: VS:  There were no vitals taken for this visit. , BMI There is no height or weight on file to calculate BMI. General:Pleasant affect, NAD Skin:Warm and dry, brisk capillary refill HEENT:normocephalic, sclera clear, mucus membranes moist Neck:supple, no JVD, no bruits  Heart:S1S2 RRR without murmur, gallup, rub  or click Lungs:clear without rales, rhonchi, or wheezes DJM:EQAS, non tender, + BS, do not palpate liver spleen or masses Ext:no lower ext edema, 2+ pedal pulses, 2+ radial pulses Neuro:alert and oriented, MAE, follows commands, + facial symmetry    EKG:  EKG is NOT ordered today.    Recent Labs: 06/03/2020: Magnesium 2.2 08/27/2020: ALT 13; TSH 1.941 09/04/2020: BUN 11; Creatinine, Ser 1.02; Hemoglobin 10.4; Platelets 326; Potassium 5.2; Sodium 142    Lipid Panel    Component Value Date/Time   CHOL 86 08/27/2020 2213   CHOL 175 05/20/2020 0928   TRIG 32 08/27/2020 2213   HDL 61 08/27/2020 2213   HDL 55 05/20/2020 0928   CHOLHDL 1.4 08/27/2020 2213   VLDL 6 08/27/2020 2213   LDLCALC 19 08/27/2020 2213   LDLCALC 97 05/20/2020 0928       Other studies Reviewed: Additional studies/ records that were reviewed today include:   Cardiac cath 06/04/20  .Prox RCA to Mid RCA lesion is 99% stenosed. Mid RCA lesion is 95% stenosed.  Scoring balloon angioplasty was performed using a BALLOON WOLVERINE 3.00X10, followed by a 3.5 [place] balloon, on both lesions.  Post intervention, there is a 0% residual stenosis.  The left ventricular systolic function is normal.  LV end diastolic pressure is normal.  The left ventricular ejection fraction is 55-65% by visual estimate.  There is no aortic valve stenosis.   Continue aggressive secondary prevention.  Patient with some residual pain, likely related to some decreased flow in marginal branches.     Left heart cath 08/28/20:  The left ventricular systolic function is normal. The left ventricular ejection fraction is 50-55% by visual estimate.  LV end diastolic pressure is low.  --------------------  Proximal RCA stent is 95% stenosed, in-stent restenosis (subsequent)  Mid-distal RCA stent is 75% stenosed.-In-stent restenosis (subsequent)  Scoring balloon angioplasty was performed on both lesions using a BALLOON WOLVERINE  3.00X15.  Noncompliant balloon angioplasty balloon angioplasty was performed using a 3.5 mm by 15 mm  balloon.  A drug-eluting stent was successfully placed open from the distal stent edge to the mid stented segment, using a SYNERGY XD 3.50X38. Postdilated to 3.8 mm  A second drug-eluting stent was successfully placed overlapping the original stent and the new stent for the proximal segment, using a SYNERGY XD 3.50X28. Postdilated to 3.8 mm  Post intervention, there is a 0% residual stenosis throughout the entire stented segment (the entire old stent segment was covered with new stents).  --------------------------------------  Dist RCA lesion is 20% stenosed.  Otherwise minimal LCA disease.  SUMMARY  Severe 2 site recurrent in-stent restenosis of the extensive proximal to distal RCA stents -> proximal-mid 95% ISR, distal stent 75% ISR ? Successful Scoring Balloon followed by [place] Balloon Angioplasty lesion preparation for 2 additional overlapping stents placed covering the 2 old stents using Synergy DES 3.5 mm x 38 and 3.5 mm by 28 mm stent postdilated to 3.8 mm.  Relatively normal left coronary arteries-no change  Normal LV function and EDP.   RECOMMENDATIONS  Return to nursing unit for ongoing care. Anticipate discharge in the morning.  [facility] 1 reconsulted  Continue aggressive risk modification  Lifelong Thienopyridine antiplatelet agent.     ASSESSMENT AND PLAN:  1.  CAD s/p stenting of the RCA in 2020 for acute inferior MI with VF arrest and shock. Returned in October with USAP. Had instent restenosis treated with scoring balloon angioplasty. Had fairly early recurrent restenosis. Presented in January with early  in stent restenosis treated with cutting balloon angioplasty and overlapping repeat stents.  She has atypical symptoms. I think a lot of this is dealing with the psychological aspects of her disease. I reviewed her prior angios. She had an excellent result  with repeat stenting and I think it is very unlikely she has restenosis at this time. I have encouraged her to go ahead and start Cardiac Rehab. I think mentally she will do better once she sees she can return to acitivty. Continue Brilinta and ASA indefinitelly.  2.  Situational depression- markedly improved on lexapro. She would like to continue this.   3.  HLD  Intolerant to high dose statin. Not at goal on simvastatin and Zetia. Now on Repatha. Excellent response with LDL to 19. Zetia stoppped.   4.  Hx of cardiogenic shock with MI in Dec 2020 with stents to RCA   5.  HTN controlled.     Follow up in 3 months.      Current medicines are reviewed with the patient today.  The patient Has no concerns regarding medicines.  The following changes have been made:  See above Labs/ tests ordered today include:see above  Disposition:   FU:  see above  Signed, Tayloranne Lekas SwazilandJordan, MD  10/06/2020 1:01 PM    [facility] [address], [place], [place]  [address]/ [address] [place], [place] Phone: [phone]) [phone]; Fax: [phone]) [phone]  [phone]

## 2020-10-09 ENCOUNTER — Encounter: Payer: Self-pay | Admitting: Cardiology

## 2020-10-09 ENCOUNTER — Ambulatory Visit: Payer: Medicare Other | Admitting: Cardiology

## 2020-10-09 ENCOUNTER — Other Ambulatory Visit: Payer: Self-pay

## 2020-10-09 VITALS — BP 120/52 | HR 65 | Ht 63.0 in | Wt 141.4 lb

## 2020-10-09 DIAGNOSIS — I2510 Atherosclerotic heart disease of native coronary artery without angina pectoris: Secondary | ICD-10-CM

## 2020-10-09 DIAGNOSIS — E785 Hyperlipidemia, unspecified: Secondary | ICD-10-CM

## 2020-10-09 DIAGNOSIS — I10 Essential (primary) hypertension: Secondary | ICD-10-CM

## 2020-11-12 ENCOUNTER — Ambulatory Visit: Payer: Medicare Other | Admitting: Cardiology

## 2020-12-04 LAB — LIPID PANEL
Chol/HDL Ratio: 1.9 ratio (ref 0.0–4.4)
Cholesterol, Total: 117 mg/dL (ref 100–199)
HDL: 61 mg/dL (ref >39)
LDL Chol Calc (NIH): 41 mg/dL (ref 0–99)
Triglycerides: 77 mg/dL (ref 0–149)
VLDL Cholesterol Cal: 15 mg/dL (ref 5–40)

## 2020-12-09 ENCOUNTER — Encounter: Payer: Self-pay | Admitting: Internal Medicine

## 2020-12-09 ENCOUNTER — Other Ambulatory Visit: Payer: Self-pay

## 2020-12-09 ENCOUNTER — Ambulatory Visit: Payer: Medicare Other | Admitting: Internal Medicine

## 2020-12-09 VITALS — BP 122/61 | HR 63 | Ht 63.0 in | Wt 143.8 lb

## 2020-12-09 DIAGNOSIS — Z789 Other specified health status: Secondary | ICD-10-CM | POA: Diagnosis not present

## 2020-12-09 DIAGNOSIS — E785 Hyperlipidemia, unspecified: Secondary | ICD-10-CM

## 2020-12-09 DIAGNOSIS — I2510 Atherosclerotic heart disease of native coronary artery without angina pectoris: Secondary | ICD-10-CM

## 2020-12-09 NOTE — Progress Notes (Signed)
LIPID CLINIC CONSULT NOTE  Chief Complaint:  Follow-up dyslipidemia  Primary Care Physician: Patient, No Pcp Per (Inactive)  Primary Cardiologist:  Peter Swaziland, MD  HPI:  Sarah Pacheco is a 75 y.o. female who is being seen today for the evaluation of dyslipidemia at the request of No ref. provider found. This is a 75 year old female with a history of dyslipidemia (LDL greater than 200), anxiety, depression and coronary disease.  She presented in early December with substernal chest pain and was found to have inferior ST elevation MI complicated by cardiogenic shock.  She required 2 stents to the proximal RCA however LVEF was normal.  Prior to this her LDL cholesterol had been in the 120s on ezetimibe and simvastatin, however just prior to the procedure LDL was at the low 80s and then 64 the day after, likely owing to the effects of acute MI.  Based on this her Zetia and simvastatin were discontinued and she was switched to rosuvastatin 2 mg daily.  After taking this for several weeks she had significant myalgias and discontinued the medication.  She was seen in follow-up by Azalee Course on December 11 who referred her to the lipid clinic for further evaluation.  11/14/2019  Sarah Pacheco returns today for follow-up.  I saw her virtually at her last office visit.  As outlined above she had coronary artery disease with cardiogenic shock and inferior ST elevation MI.  Her lipids were above target.  We discussed the combination simvastatin and ezetimibe (Vytorin) and she was successfully placed on that.  Her lipids as of December showed marked improvement with an LDL 64, however more recently her LDLs trended back up to 85.  Total cholesterol was 162 and triglycerides were 115.  She notes a little less activity although recently has started to become more active.  She is asymptomatic with that.  She says that her diet tends to be pretty good except for sweets and baked goods which may contain butter  and other sources of saturated fat.  06/12/2020  Sarah Pacheco seen today for follow-up of dyslipidemia.  Unfortunately the other week she developed symptoms concerning for angina.  I discussed her case with Dr. Swaziland who recommended her to come in for cardiac catheterization.  This demonstrated a 95% mid RCA stenosis and a proximal to mid 99% stenosis of a previously treated lesion.  She underwent scoring balloon angioplasty with IVUS guidance.  This improved her symptoms significantly.  She did have a repeat lipid profile recently which showed total cholesterol 175, triglycerides 130, HDL 55 and LDL 97.  This is on ezetimibe and simvastatin 40 mg daily.  We discussed that she remains above a target LDL less than 70 and could drive further risk reduction with further improvement.  12/09/2020  Sarah Pacheco is seen today in follow-up.  Overall she is doing well on Repatha.  Her recent lipids showed total cholesterol 117, triglycerides 77, HDL 61 and LDL 41.  She denies any side effects with this.  She says is well-tolerated and affordable for her.  PMHx:  Past Medical History:  Diagnosis Date  . Anxiety   . Arthritis   . CAD (coronary artery disease)    a. 07/2019 Inf STEMI/VF Arrest/CGS/PCI: LM nl, LADmin irregs, LCX nl, RCA 100p/m (2.75x38 & 2.75x26 Resolute Onyx DESs).  . Cataract   . Chest pain   . Depression   . GERD (gastroesophageal reflux disease)   . History of echocardiogram    a.  07/2019 Echo: EF 60-65%, no rwma, nl RV size/fxn, mildly dil LA, mild MR.  Marland Kitchen Hyperlipidemia with target LDL less than 70 10/25/2015    Past Surgical History:  Procedure Laterality Date  . APPENDECTOMY    . CATARACT EXTRACTION, BILATERAL    . COLONOSCOPY  2017   X2     colon polyps  . CORONARY BALLOON ANGIOPLASTY N/A 06/04/2020   Procedure: CORONARY BALLOON ANGIOPLASTY;  Surgeon: Corky Crafts, MD;  Location: [facility] INVASIVE CV LAB;  Service: Cardiovascular;  Laterality: N/A;  . CORONARY STENT  INTERVENTION N/A 08/28/2020   Procedure: CORONARY STENT INTERVENTION;  Surgeon: Marykay Lex, MD;  Location: [facility] INVASIVE CV LAB;  Service: Cardiovascular;  Laterality: N/A;  . CORONARY/GRAFT ACUTE MI REVASCULARIZATION N/A 07/11/2019   Procedure: CORONARY/GRAFT ACUTE MI REVASCULARIZATION;  Surgeon: Swaziland, Peter M, MD;  Location: [facility] INVASIVE CV LAB;  Service: Cardiovascular;  Laterality: N/A;  . INTRAVASCULAR ULTRASOUND/IVUS N/A 06/04/2020   Procedure: Intravascular Ultrasound/IVUS;  Surgeon: Corky Crafts, MD;  Location: [facility] INVASIVE CV LAB;  Service: Cardiovascular;  Laterality: N/A;  . LAPAROTOMY N/A 10/10/2018   Procedure: EXPLORATORY LAPAROTOMY RIGHT COLECTOMY;  Surgeon: Emelia Loron, MD;  Location: [facility] ORS;  Service: General;  Laterality: N/A;  . LEFT HEART CATH AND CORONARY ANGIOGRAPHY N/A 07/11/2019   Procedure: LEFT HEART CATH AND CORONARY ANGIOGRAPHY;  Surgeon: Swaziland, Peter M, MD;  Location: [facility] INVASIVE CV LAB;  Service: Cardiovascular;  Laterality: N/A;  . LEFT HEART CATH AND CORONARY ANGIOGRAPHY N/A 06/04/2020   Procedure: LEFT HEART CATH AND CORONARY ANGIOGRAPHY;  Surgeon: Corky Crafts, MD;  Location: [facility] INVASIVE CV LAB;  Service: Cardiovascular;  Laterality: N/A;  . LEFT HEART CATH AND CORONARY ANGIOGRAPHY N/A 08/28/2020   Procedure: LEFT HEART CATH AND CORONARY ANGIOGRAPHY;  Surgeon: Marykay Lex, MD;  Location: [facility] INVASIVE CV LAB;  Service: Cardiovascular;  Laterality: N/A;  . TEMPORARY PACEMAKER N/A 07/11/2019   Procedure: TEMPORARY PACEMAKER;  Surgeon: Swaziland, Peter M, MD;  Location: [facility] INVASIVE CV LAB;  Service: Cardiovascular;  Laterality: N/A;  . VAGINAL HYSTERECTOMY  1973   AUB, fibroids    FAMHx:  Family History  Problem Relation Age of Onset  . Heart attack Father 47       MI  . Heart disease Father   . Heart disease Sister   . Heart attack Brother 59       MI  . Heart attack Sister   . Heart disease Sister   . Colon cancer Sister 44  .  Colon polyps Sister   . Heart disease Brother   . Heart failure Brother   . Colon cancer Brother 47  . Colon polyps Brother   . COPD Brother   . Lung cancer Brother   . Esophageal cancer Neg Hx   . Rectal cancer Neg Hx   . Stomach cancer Neg Hx     SOCHx:   reports that she has quit smoking. Her smoking use included cigarettes. She has a 7.50 pack-year smoking history. She has never used smokeless tobacco. She reports that she does not drink alcohol and does not use drugs.  ALLERGIES:  Allergies  Allergen Reactions  . Atorvastatin Other (See Comments)    Cramps in legs  . Codeine Nausea And Vomiting  . Erythromycin Base Nausea Only  . Penicillin G Hives    Did it involve swelling of the face/tongue/throat, SOB, or low BP? No Did it involve sudden or severe rash/hives, skin peeling, or any reaction  on the inside of your mouth or nose? Yes Did you need to seek medical attention at a hospital or doctor's office? Yes When did it last happen?2010 If all above answers are "NO", may proceed with cephalosporin use.   . Rosuvastatin Other (See Comments)    Muscle pain    ROS: Pertinent items noted in HPI and remainder of comprehensive ROS otherwise negative.  HOME MEDS: Current Outpatient Medications on File Prior to Visit  Medication Sig Dispense Refill  . acetaminophen (TYLENOL) 500 MG tablet Take 1,000 mg by mouth every 6 (six) hours as needed for mild pain or headache.     Marland Kitchen aspirin EC 81 MG EC tablet Take 1 tablet (81 mg total) by mouth daily. 90 tablet 3  . Cyanocobalamin (VITAMIN B-12) 5000 MCG TBDP Take 5,000 mcg by mouth daily.     Marland Kitchen escitalopram (LEXAPRO) 10 MG tablet Take 1 tablet (10 mg total) by mouth daily. 90 tablet 3  . Evolocumab (REPATHA SURECLICK) 140 MG/ML SOAJ Inject 1 Dose into the skin every 14 (fourteen) days. 6 mL 3  . nitroGLYCERIN (NITROSTAT) 0.4 MG SL tablet Place 1 tablet (0.4 mg total) under the tongue every 5 (five) minutes as needed for chest pain  (up to 3 doses. If taking 3rd dose call 911). 25 tablet 3  . Omega-3 Fatty Acids (EQL OMEGA 3 FISH OIL) 1200 MG CAPS Take 1,200 mg by mouth daily.    . pantoprazole (PROTONIX) 40 MG tablet Take 1 tablet (40 mg total) by mouth daily. 30 tablet 6  . simvastatin (ZOCOR) 40 MG tablet Take 1 tablet (40 mg total) by mouth daily. 90 tablet 3  . ticagrelor (BRILINTA) 90 MG TABS tablet Take 1 tablet (90 mg total) by mouth 2 (two) times daily. 180 tablet 3  . metoprolol tartrate (LOPRESSOR) 25 MG tablet Take 0.5 tablets (12.5 mg total) by mouth 2 (two) times daily. 90 tablet 3   No current facility-administered medications on file prior to visit.    LABS/IMAGING: No results found for this or any previous visit (from the past 48 hour(s)). No results found.  LIPID PANEL:    Component Value Date/Time   CHOL 117 12/04/2020 0957   TRIG 77 12/04/2020 0957   HDL 61 12/04/2020 0957   CHOLHDL 1.9 12/04/2020 0957   CHOLHDL 1.4 08/27/2020 2213   VLDL 6 08/27/2020 2213   LDLCALC 41 12/04/2020 0957    WEIGHTS: Wt Readings from Last 3 Encounters:  12/09/20 143 lb 12.8 oz (65.2 kg)  10/09/20 141 lb 6.4 oz (64.1 kg)  09/04/20 140 lb (63.5 kg)    VITALS: BP 122/61   Pulse 63   Ht 5\' 3"  (1.6 m)   Wt 143 lb 12.8 oz (65.2 kg)   SpO2 96%   BMI 25.47 kg/m   EXAM: Deferred  EKG: Deferred  ASSESSMENT: 1. CAD with recent inferior STEMI (07/2019), status post DES x2 to the RCA -repeat obstructive coronary disease to the RCA with [place] Balloon angioplasty 05/2020) 2. Recent cardiogenic shock 3. Dyslipidemia with intolerance to high potency Crestor 4. Goal LDL less than 70  PLAN: 1.  Sarah Pacheco has had significant incremental improvement in her lipids on Repatha in addition to her simvastatin.  Total cholesterol is now 117 with LDL at 41.  She seems to be tolerating it without any issues.  Cost is reasonable for her.  She has a ready prior authorization in place.  We will plan annual follow-up  for reauthorization and  she will see her primary cardiologist Dr. SwazilandJordan in a few weeks.  Chrystie NoseKenneth C. Tedd Cottrill, MD, [facility] DentonFACC, FACP  [place]  [facility]  Medical Director of the Advanced Lipid Disorders &  Cardiovascular Risk Reduction Clinic Diplomate of the American Board of Clinical Lipidology Attending Cardiologist  Direct Dial: [phone]  Fax: [phone]  Website:  [email or web address]  Lisette AbuKenneth C Avrian Delfavero 12/09/2020, 2:18 PM

## 2020-12-09 NOTE — Patient Instructions (Addendum)
Medication Instructions:  No Changes In Medications at this time.  *If you need a refill on your cardiac medications before your next appointment, please call your pharmacy*  Lab Work: LIPID PANEL (CHOLESTEROL)- IN 1 YEAR If you have labs (blood work) drawn today and your tests are completely normal, you will receive your results only by: Marland Kitchen MyChart Message (if you have MyChart) OR . A paper copy in the mail If you have any lab test that is abnormal or we need to change your treatment, we will call you to review the results.  Follow-Up: At [facility], you and your health needs are our priority.  As part of our continuing mission to provide you with exceptional heart care, we have created designated Provider Care Teams.  These Care Teams include your primary Cardiologist (physician) and Advanced Practice Providers (APPs -  Physician Assistants and Nurse Practitioners) who all work together to provide you with the care you need, when you need it.  Your next appointment:   1 year(s) LIPID CLINIC  The format for your next appointment:   In Person  Provider:   Dr. Rennis Golden

## 2020-12-27 NOTE — Progress Notes (Signed)
Cardiology Office Note   Date:  12/30/2020   ID:  Sarah Pacheco, Sarah Pacheco 1946/06/21, MRN [id number]  PCP:  Patient, No Pcp Per (Inactive)  Cardiologist:  Dr. Swaziland    Chief Complaint  Patient presents with  . Coronary Artery Disease      History of Present Illness: Sarah Pacheco is a 75 y.o. female who presents for follow up of CAD.   PMH of CAD s/p inferior STEMI complicated by VF arrest(during cath)and cardiogenic shock in 07/2019 requiring DES x 2. Other h/o includes HL, GERD, anxiety, depression, and OA. Echo at the time of her MI showed nl LV fxn w/o rwma.   In October 2021 shebegan to experience intermittent substernal chest discomfort that was most notable while lying down and also with activity. When occurring at rest, symptoms did seem to improve with sitting up or sublingual nitroglycerin. Exertional symptoms eased with rest. She presented to the emergency department October 23 where troponins were normal x2. ECG showed subtle inferolateral ST depression. She was pain-free in the emergency department and was subsequently discharged home with plan for outpatient cardiology follow-up. Unfortunately,  she  continued to have intermittent rest and exertional substernal chest heaviness associated with occasional diaphoresis as well as radiation to the shoulders and down bilateral arms to the fingers. Symptoms are very reminiscent of angina prior to MI.  She was admitted and underwent cath.   Cardiac cath showed severe in stent restenosis of the RCA. She had scoring balloon PTCA. This yielded and excellent result angiographically and by IVUS.  Ef 55-65%  She has since had follow up in lipid clinic with Dr Rennis Golden and started on PCSK 9 inhibitor- Repatha. She was also started on lexapro for symptoms of depression.  She presented back to [facility] in January with worsening chest pain. HS troponin 8-9 range and EKG nonischemic. CP relieved with nitro. Repeat heart cath 08/28/20 with  severe 2-site recurrent in-stent restenosis of proxima-distal RCA (95%, 75%) treated with scoring balloon and balloon angioplasty and 2 additional overlapping stents. She tolerated the procedure well. Lifelong DAPT was recommended. Due to improved cholesterol profile, zetia was discontinued.   On follow up today she is doing OK. Depression is better. She is more engaged now and active.  She does note some intermittent pain in her arm that responds to Ntg. This is infrequent. She does struggle with SOB. Notes her BP is low in the am down to 95/58 and feels fatigued. No chest pain. She reports her younger brother died recently suddenly.     Past Medical History:  Diagnosis Date  . Anxiety   . Arthritis   . CAD (coronary artery disease)    a. 07/2019 Inf STEMI/VF Arrest/CGS/PCI: LM nl, LADmin irregs, LCX nl, RCA 100p/m (2.75x38 & 2.75x26 Resolute Onyx DESs).  . Cataract   . Chest pain   . Depression   . GERD (gastroesophageal reflux disease)   . History of echocardiogram    a. 07/2019 Echo: EF 60-65%, no rwma, nl RV size/fxn, mildly dil LA, mild MR.  Marland Kitchen Hyperlipidemia with target LDL less than 70 10/25/2015    Past Surgical History:  Procedure Laterality Date  . APPENDECTOMY    . CATARACT EXTRACTION, BILATERAL    . COLONOSCOPY  2017   X2     colon polyps  . CORONARY BALLOON ANGIOPLASTY N/A 06/04/2020   Procedure: CORONARY BALLOON ANGIOPLASTY;  Surgeon: Corky Crafts, MD;  [facility] INVASIVE CV LAB;  Service: Cardiovascular;  Laterality: N/A;  . CORONARY STENT INTERVENTION N/A 08/28/2020   Procedure: CORONARY STENT INTERVENTION;  Surgeon: Marykay Lex, MD;  Location: [facility] INVASIVE CV LAB;  Service: Cardiovascular;  Laterality: N/A;  . CORONARY/GRAFT ACUTE MI REVASCULARIZATION N/A 07/11/2019   Procedure: CORONARY/GRAFT ACUTE MI REVASCULARIZATION;  Surgeon: Swaziland, Anjel Pardo M, MD;  Location: [facility] INVASIVE CV LAB;  Service: Cardiovascular;  Laterality: N/A;  . INTRAVASCULAR  ULTRASOUND/IVUS N/A 06/04/2020   Procedure: Intravascular Ultrasound/IVUS;  Surgeon: Corky Crafts, MD;  Location: [facility] INVASIVE CV LAB;  Service: Cardiovascular;  Laterality: N/A;  . LAPAROTOMY N/A 10/10/2018   Procedure: EXPLORATORY LAPAROTOMY RIGHT COLECTOMY;  Surgeon: Emelia Loron, MD;  Location: [facility] ORS;  Service: General;  Laterality: N/A;  . LEFT HEART CATH AND CORONARY ANGIOGRAPHY N/A 07/11/2019   Procedure: LEFT HEART CATH AND CORONARY ANGIOGRAPHY;  Surgeon: Swaziland, Micco Bourbeau M, MD;  Location: [facility] INVASIVE CV LAB;  Service: Cardiovascular;  Laterality: N/A;  . LEFT HEART CATH AND CORONARY ANGIOGRAPHY N/A 06/04/2020   Procedure: LEFT HEART CATH AND CORONARY ANGIOGRAPHY;  Surgeon: Corky Crafts, MD;  Location: [facility] INVASIVE CV LAB;  Service: Cardiovascular;  Laterality: N/A;  . LEFT HEART CATH AND CORONARY ANGIOGRAPHY N/A 08/28/2020   Procedure: LEFT HEART CATH AND CORONARY ANGIOGRAPHY;  Surgeon: Marykay Lex, MD;  Location: [facility] INVASIVE CV LAB;  Service: Cardiovascular;  Laterality: N/A;  . TEMPORARY PACEMAKER N/A 07/11/2019   Procedure: TEMPORARY PACEMAKER;  Surgeon: Swaziland, Tri Chittick M, MD;  Location: [facility] INVASIVE CV LAB;  Service: Cardiovascular;  Laterality: N/A;  . VAGINAL HYSTERECTOMY  1973   AUB, fibroids     Current Outpatient Medications  Medication Sig Dispense Refill  . acetaminophen (TYLENOL) 500 MG tablet Take 1,000 mg by mouth every 6 (six) hours as needed for mild pain or headache.     Marland Kitchen aspirin EC 81 MG EC tablet Take 1 tablet (81 mg total) by mouth daily. 90 tablet 3  . clopidogrel (PLAVIX) 75 MG tablet Take 1 tablet (75 mg total) by mouth daily. 90 tablet 3  . Cyanocobalamin (VITAMIN B-12) 5000 MCG TBDP Take 5,000 mcg by mouth daily.     Marland Kitchen escitalopram (LEXAPRO) 10 MG tablet Take 1 tablet (10 mg total) by mouth daily. 90 tablet 3  . Evolocumab (REPATHA SURECLICK) 140 MG/ML SOAJ Inject 1 Dose into the skin every 14 (fourteen) days. 6 mL 3  . nitroGLYCERIN  (NITROSTAT) 0.4 MG SL tablet Place 1 tablet (0.4 mg total) under the tongue every 5 (five) minutes as needed for chest pain (up to 3 doses. If taking 3rd dose call 911). 25 tablet 3  . Omega-3 Fatty Acids (EQL OMEGA 3 FISH OIL) 1200 MG CAPS Take 1,200 mg by mouth daily.    . pantoprazole (PROTONIX) 40 MG tablet Take 1 tablet (40 mg total) by mouth daily. 30 tablet 6  . simvastatin (ZOCOR) 40 MG tablet Take 1 tablet (40 mg total) by mouth daily. 90 tablet 3   No current facility-administered medications for this visit.    Allergies:   Atorvastatin, Codeine, Erythromycin base, Penicillin g, and Rosuvastatin    Social History:  The patient  reports that she has quit smoking. Her smoking use included cigarettes. She has a 7.50 pack-year smoking history. She has never used smokeless tobacco. She reports that she does not drink alcohol and does not use drugs.   Family History:  The patient's family history includes COPD in her brother; Colon cancer (age of onset: 79) in her brother and sister;  Colon polyps in her brother and sister; Heart attack in her sister; Heart attack (age of onset: 79) in her brother; Heart attack (age of onset: 62) in her father; Heart disease in her brother, father, sister, and sister; Heart failure in her brother; Lung cancer in her brother.   ROS:  As noted in HPI. All other systems are reviewed and are negative.   Wt Readings from Last 3 Encounters:  12/30/20 142 lb (64.4 kg)  12/09/20 143 lb 12.8 oz (65.2 kg)  10/09/20 141 lb 6.4 oz (64.1 kg)     PHYSICAL EXAM: VS:  BP 108/60   Pulse 76   Ht 5\' 3"  (1.6 m)   Wt 142 lb (64.4 kg)   SpO2 98%   BMI 25.15 kg/m  , BMI Body mass index is 25.15 kg/m. General:Pleasant affect, NAD Skin:Warm and dry, brisk capillary refill HEENT:normocephalic, sclera clear, mucus membranes moist Neck:supple, no JVD, no bruits  Heart:S1S2 RRR without murmur, gallup, rub or click Lungs:clear without rales, rhonchi, or wheezes ,  non tender, + BS, do not palpate liver spleen or masses Ext:no lower ext edema, 2+ pedal pulses, 2+ radial pulses Neuro:alert and oriented, MAE, follows commands, + facial symmetry    EKG:  EKG is NOT ordered today.    Recent Labs: 06/03/2020: Magnesium 2.2 08/27/2020: ALT 13; TSH 1.941 09/04/2020: BUN 11; Creatinine, Ser 1.02; Hemoglobin 10.4; Platelets 326; Potassium 5.2; Sodium 142    Lipid Panel    Component Value Date/Time   CHOL 117 12/04/2020 0957   TRIG 77 12/04/2020 0957   HDL 61 12/04/2020 0957   CHOLHDL 1.9 12/04/2020 0957   CHOLHDL 1.4 08/27/2020 2213   VLDL 6 08/27/2020 2213   LDLCALC 41 12/04/2020 0957       Other studies Reviewed: Additional studies/ records that were reviewed today include:   Cardiac cath 06/04/20  .Prox RCA to Mid RCA lesion is 99% stenosed. Mid RCA lesion is 95% stenosed.  Scoring balloon angioplasty was performed using a BALLOON WOLVERINE 3.00X10, followed by a 3.5 [place] balloon, on both lesions.  Post intervention, there is a 0% residual stenosis.  The left ventricular systolic function is normal.  LV end diastolic pressure is normal.  The left ventricular ejection fraction is 55-65% by visual estimate.  There is no aortic valve stenosis.   Continue aggressive secondary prevention.  Patient with some residual pain, likely related to some decreased flow in marginal branches.     Left heart cath 08/28/20:  The left ventricular systolic function is normal. The left ventricular ejection fraction is 50-55% by visual estimate.  LV end diastolic pressure is low.  --------------------  Proximal RCA stent is 95% stenosed, in-stent restenosis (subsequent)  Mid-distal RCA stent is 75% stenosed.-In-stent restenosis (subsequent)  Scoring balloon angioplasty was performed on both lesions using a BALLOON WOLVERINE 3.00X15.  Noncompliant balloon angioplasty balloon angioplasty was performed using a 3.5 mm by 15 mm [place] balloon.  A  drug-eluting stent was successfully placed open from the distal stent edge to the mid stented segment, using a SYNERGY XD 3.50X38. Postdilated to 3.8 mm  A second drug-eluting stent was successfully placed overlapping the original stent and the new stent for the proximal segment, using a SYNERGY XD 3.50X28. Postdilated to 3.8 mm  Post intervention, there is a 0% residual stenosis throughout the entire stented segment (the entire old stent segment was covered with new stents).  --------------------------------------  Dist RCA lesion is 20% stenosed.  Otherwise minimal LCA disease.  SUMMARY  Severe 2 site recurrent in-stent restenosis of the extensive proximal to distal RCA stents -> proximal-mid 95% ISR, distal stent 75% ISR ? Successful Scoring Balloon followed by [place] Balloon Angioplasty lesion preparation for 2 additional overlapping stents placed covering the 2 old stents using Synergy DES 3.5 mm x 38 and 3.5 mm by 28 mm stent postdilated to 3.8 mm.  Relatively normal left coronary arteries-no change  Normal LV function and EDP.   RECOMMENDATIONS  Return to nursing unit for ongoing care. Anticipate discharge in the morning.  [facility] reconsulted  Continue aggressive risk modification  Lifelong Thienopyridine antiplatelet agent.     ASSESSMENT AND PLAN:  1.  CAD s/p stenting of the RCA in 2020 for acute inferior MI with VF arrest and shock. Returned in October with USAP. Had instent restenosis treated with scoring balloon angioplasty. Had fairly early recurrent restenosis. Presented in January with early  in stent restenosis treated with cutting balloon angioplasty and overlapping repeat stents.  She has stable angina class 1.  I do think her symptoms of dyspnea may be related to Brilinta. We will switch to Plavix with loading dose of 300 mg. Will also discontinue low dose metoprolol given low BP and symptoms of fatigue.   2.  Situational depression- markedly improved on  lexapro. She would like to continue this.   3.  HLD  Intolerant to high dose statin. On simvastatin and now on Repatha. Excellent response with LDL to 41.   4.  Hx of cardiogenic shock with MI in Dec 2020 with stents to RCA   5.  HTN controlled.     Follow up in 6 months.   Signed, Chudney Scheffler SwazilandJordan, MD  12/30/2020 3:20 PM    [facility] [address], [place], [place]  [address]/ [address] [place], [place] Phone: [phone]; Fax: [phone]  [phone]

## 2020-12-30 ENCOUNTER — Ambulatory Visit: Payer: Medicare Other | Admitting: Cardiology

## 2020-12-30 ENCOUNTER — Encounter: Payer: Self-pay | Admitting: Cardiology

## 2020-12-30 ENCOUNTER — Other Ambulatory Visit: Payer: Self-pay

## 2020-12-30 VITALS — BP 108/60 | HR 76 | Ht 63.0 in | Wt 142.0 lb

## 2020-12-30 DIAGNOSIS — E785 Hyperlipidemia, unspecified: Secondary | ICD-10-CM | POA: Diagnosis not present

## 2020-12-30 DIAGNOSIS — R0600 Dyspnea, unspecified: Secondary | ICD-10-CM

## 2020-12-30 DIAGNOSIS — R0609 Other forms of dyspnea: Secondary | ICD-10-CM

## 2020-12-30 DIAGNOSIS — I2510 Atherosclerotic heart disease of native coronary artery without angina pectoris: Secondary | ICD-10-CM

## 2020-12-30 MED ORDER — CLOPIDOGREL BISULFATE 75 MG PO TABS: 75.0000 mg | ORAL_TABLET | Freq: Every day | ORAL | 3 refills | Status: DC

## 2020-12-30 NOTE — Patient Instructions (Signed)
Stop taking Brilinta and metoprolol  Start Plavix- the first day take 4 tablets then one a day after.

## 2021-01-20 ENCOUNTER — Telehealth: Payer: Medicare Other | Admitting: Nurse Practitioner

## 2021-01-20 ENCOUNTER — Other Ambulatory Visit (HOSPITAL_COMMUNITY): Payer: Self-pay

## 2021-01-20 ENCOUNTER — Encounter: Payer: Self-pay | Admitting: Nurse Practitioner

## 2021-01-20 DIAGNOSIS — U071 COVID-19: Secondary | ICD-10-CM

## 2021-01-20 MED ORDER — MOLNUPIRAVIR EUA 200MG CAPSULE
4.0000 | ORAL_CAPSULE | Freq: Two times a day (BID) | ORAL | 0 refills | Status: AC
  Filled 2021-01-20: qty 40, 5d supply, fill #0

## 2021-01-20 MED ORDER — BENZONATATE 100 MG PO CAPS
100.0000 mg | ORAL_CAPSULE | Freq: Three times a day (TID) | ORAL | 0 refills | Status: DC | PRN
  Filled 2021-01-20: qty 20, 6d supply, fill #0

## 2021-01-20 NOTE — Progress Notes (Signed)
Sarah Pacheco, Sarah Pacheco are scheduled for a virtual visit with Mary-Margaret Dvonte Gatliff< FNP today.    Just as we do with appointments in the office, we must obtain your consent to participate.  Your consent will be active for this visit and any virtual visit you may have with one of our providers in the next 365 days.    If you have a MyChart account, I can also send a copy of this consent to you electronically.  All virtual visits are billed to your insurance company just like a traditional visit in the office.  As this is a virtual visit, video technology does not allow for your provider to perform a traditional examination.  This may limit your provider's ability to fully assess your condition.  If your provider identifies any concerns that need to be evaluated in person or the need to arrange testing such as labs, EKG, etc, we will make arrangements to do so.    Although advances in technology are sophisticated, we cannot ensure that it will always work on either your end or our end.  If the connection with a video visit is poor, we may have to switch to a telephone visit.  With either a video or telephone visit, we are not always able to ensure that we have a secure connection.   I need to obtain your verbal consent now.   Are you willing to proceed with your visit today?   Sarah Pacheco has provided verbal consent on 01/20/2021 for a virtual visit (video or telephone).  Virtual Visit via Video  I connected with  Sarah Pacheco  on 01/20/21 at 1:35 by video and verified that I am speaking with the correct person using two identifiers. Sarah Pacheco is currently located at home and none is currently with her during visit. The provider, Mary-Margaret Daphine Deutscher, FNP is located at home at time of visit.  I discussed the limitations, risks, security and privacy concerns of performing an evaluation and management service by video  and the availability of in person appointments. I also discussed with the patient  that there may be a patient responsible charge related to this service. The patient expressed understanding and agreed to proceed.   Subjective:   HPI:   Patient was driving home from [place] yesterday and started to develop cough and congestion. She was up all night last night with cough and congestion. Has developed body aches. Says she just feels terrible. She took a covid test today and was positive.   Review of Systems  Constitutional:  Negative for chills and fever.  HENT:  Positive for congestion. Negative for sore throat.   Respiratory:  Positive for cough, sputum production and shortness of breath.   Musculoskeletal:  Positive for myalgias.  Neurological:  Negative for headaches.    See pertinent positives and negatives per HPI.  Patient Active Problem List   Diagnosis Date Noted   Coronary stent restenosis due to scar tissue    Unstable angina (HCC) 06/03/2020   CAD S/P percutaneous coronary angioplasty 07/13/2019   Cardiogenic shock (HCC) 07/13/2019   Bradycardia 07/13/2019   Mild anemia 07/13/2019   Hypokalemia 07/13/2019   Pre-diabetes 07/13/2019   AKI (acute kidney injury) (HCC) 07/13/2019   Non-ST elevation (NSTEMI) myocardial infarction (HCC) 07/11/2019   Ileus, postoperative (HCC) 10/21/2018   Postoperative ileus (HCC) 10/21/2018   Cecal volvulus (HCC) 10/10/2018   Hyperlipidemia with target LDL less than 70 10/25/2015    Social History   Tobacco  Use   Smoking status: Former    Packs/day: 0.50    Years: 15.00    Pack years: 7.50    Types: Cigarettes   Smokeless tobacco: Never   Tobacco comments:    Quit around age 24  Substance Use Topics   Alcohol use: No    Current Outpatient Medications:    acetaminophen (TYLENOL) 500 MG tablet, Take 1,000 mg by mouth every 6 (six) hours as needed for mild pain or headache. , Disp: , Rfl:    aspirin EC 81 MG EC tablet, Take 1 tablet (81 mg total) by mouth daily., Disp: 90 tablet, Rfl: 3   clopidogrel (PLAVIX)  75 MG tablet, Take 1 tablet (75 mg total) by mouth daily., Disp: 90 tablet, Rfl: 3   Cyanocobalamin (VITAMIN B-12) 5000 MCG TBDP, Take 5,000 mcg by mouth daily. , Disp: , Rfl:    escitalopram (LEXAPRO) 10 MG tablet, Take 1 tablet (10 mg total) by mouth daily., Disp: 90 tablet, Rfl: 3   Evolocumab (REPATHA SURECLICK) 140 MG/ML SOAJ, Inject 1 Dose into the skin every 14 (fourteen) days., Disp: 6 mL, Rfl: 3   nitroGLYCERIN (NITROSTAT) 0.4 MG SL tablet, Place 1 tablet (0.4 mg total) under the tongue every 5 (five) minutes as needed for chest pain (up to 3 doses. If taking 3rd dose call 911)., Disp: 25 tablet, Rfl: 3   Omega-3 Fatty Acids (EQL OMEGA 3 FISH OIL) 1200 MG CAPS, Take 1,200 mg by mouth daily., Disp: , Rfl:    pantoprazole (PROTONIX) 40 MG tablet, Take 1 tablet (40 mg total) by mouth daily., Disp: 30 tablet, Rfl: 6   simvastatin (ZOCOR) 40 MG tablet, Take 1 tablet (40 mg total) by mouth daily., Disp: 90 tablet, Rfl: 3  Allergies  Allergen Reactions   Atorvastatin Other (See Comments)    Cramps in legs   Codeine Nausea And Vomiting   Erythromycin Base Nausea Only   Penicillin G Hives    Did it involve swelling of the face/tongue/throat, SOB, or low BP? No Did it involve sudden or severe rash/hives, skin peeling, or any reaction on the inside of your mouth or nose? Yes Did you need to seek medical attention at a hospital or doctor's office? Yes When did it last happen?  2010 If all above answers are "NO", may proceed with cephalosporin use.    Rosuvastatin Other (See Comments)    Muscle pain    Objective:   There were no vitals taken for this visit.  Patient is well-developed, well-nourished in no acute distress.  Resting comfortably  at home.  Head is normocephalic, atraumatic.  No labored breathing.  Speech is clear and coherent with logical content.  Patient is alert and oriented at baseline.  Dry cough oted  Assessment and Plan:        Sarah Pacheco in today with  chief complaint of No chief complaint on file.   1. Lab test positive for detection of COVID-19 virus Force fluids Humidifier Quarantine Meds ordered this encounter  Medications   molnupiravir EUA 200 mg CAPS    Sig: Take 4 capsules (800 mg total) by mouth 2 (two) times daily for 5 days.    Dispense:  40 capsule    Refill:  0    Order Specific Question:   Supervising Provider    Answer:   MILLER, BRIAN [[id number]]   benzonatate (TESSALON PERLES) 100 MG capsule    Sig: Take 1 capsule (100 mg total) by mouth 3 (three)  times daily as needed.    Dispense:  20 capsule    Refill:  0    Order Specific Question:   Supervising Provider    Answer:   Eber Hong [[id number]]       The above assessment and management plan was discussed with the patient. The patient verbalized understanding of and has agreed to the management plan. Patient is aware to call the clinic if symptoms persist or worsen. Patient is aware when to return to the clinic for a follow-up visit. Patient educated on when it is appropriate to go to the emergency department.   Mary-Margaret Daphine Deutscher, FNP  .   Mary-Margaret Daphine Deutscher, FNP 01/20/2021  Time spent with the patient: 15 minutes, of which >50% was spent in obtaining information about symptoms, reviewing previous labs, evaluations, and treatments, counseling about condition (please see the discussed topics above), and developing a plan to further investigate it; had a number of questions which I addressed.

## 2021-01-20 NOTE — Patient Instructions (Signed)
You are being prescribed MOLNUPIRAVIR for COVID-19 infection.    Please pick up your prescription at: [facility] pharmacy  Address:  [address] [place], [place] [address]         Hours:  Monday - Friday 7:30 am - 6PM                         Saturday 8:00 am - 4:30 pm      Sunday Closed  Phone: [phone]  Please pick up your prescription at: [facility]  Address:  [facility], [address] [place], [place] [address]      Hours:  Monday - Friday 7:30 am - 5:30 pm.  Saturday Closed   Sunday Closed  Phone: [phone]    [facility] ([address], [place], [place] #[phone]) Monday through Friday 7:30a-6p  [facility] [phone], [place], [place] [address] [phone]) Monday through Friday 7:30a-6p  [facility] ([address] [place], [place] [address][phone])  Monday through Friday 8a-5:30p  [facility] [address] [place], [place] [address] [phone]) Hours Monday through Friday 8a-7p, Saturday 8a-5p, Sunday 1p-5p   Please pick up your prescription at: [place] pharmacy called into   Please call the pharmacy or go through the drive through vs going inside if you are picking up the mediation yourself to prevent further spread. If prescribed to a [facility] affiliated pharmacy, a pharmacist will bring the medication out to your car.   ADMINISTRATION INSTRUCTIONS: Take with or without food. Swallow the tablets whole. Don't chew, crush, or break the medications because it might not work as well  For each dose of the medication, you should be taking FOUR tablets at one time, TWICE a day   Finish your full five-day course of Molnupiravir even if you feel better before you're done. Stopping this medication too early can make it less effective to prevent severe illness related to COVID19.    Molnupiravir is prescribed for YOU ONLY. Don't  share it with others, even if they have similar symptoms as you. This medication might not be right for everyone.   Make sure to take steps to protect yourself and others while you're taking this medication in order to get well soon and to prevent others from getting sick with COVID-19.   **If you are of childbearing potential (any gender) - it is advised to not get pregnant while taking this medication and recommended that condoms are used for female partners the next 3 months after taking the medication out of extreme caution    COMMON SIDE EFFECTS: Diarrhea Nausea  Dizziness    If your COVID-19 symptoms get worse, get medical help right away. Call 911 if you experience symptoms such as worsening cough, trouble breathing, chest pain that doesn't go away, confusion, a hard time staying awake, and pale or blue-colored skin. This medication won't prevent all COVID-19 cases from getting worse.

## 2021-05-16 ENCOUNTER — Other Ambulatory Visit: Payer: Self-pay | Admitting: Internal Medicine

## 2021-06-06 ENCOUNTER — Telehealth: Payer: Self-pay | Admitting: Internal Medicine

## 2021-06-06 NOTE — Telephone Encounter (Signed)
Medication approved Effective from 06/06/2021 through 06/06/2022

## 2021-06-06 NOTE — Telephone Encounter (Signed)
PA for repatha sureclick submitted via CMM (Key: [id number])

## 2021-06-12 ENCOUNTER — Other Ambulatory Visit: Payer: Self-pay | Admitting: Cardiology

## 2021-06-13 IMAGING — DX DG CHEST 2V
2 series · 2 of 2 positions shown · non-contrast
Comparison: None.

CLINICAL DATA: Chest pain.

EXAM:
CHEST - 2 VIEW

[chest pa]
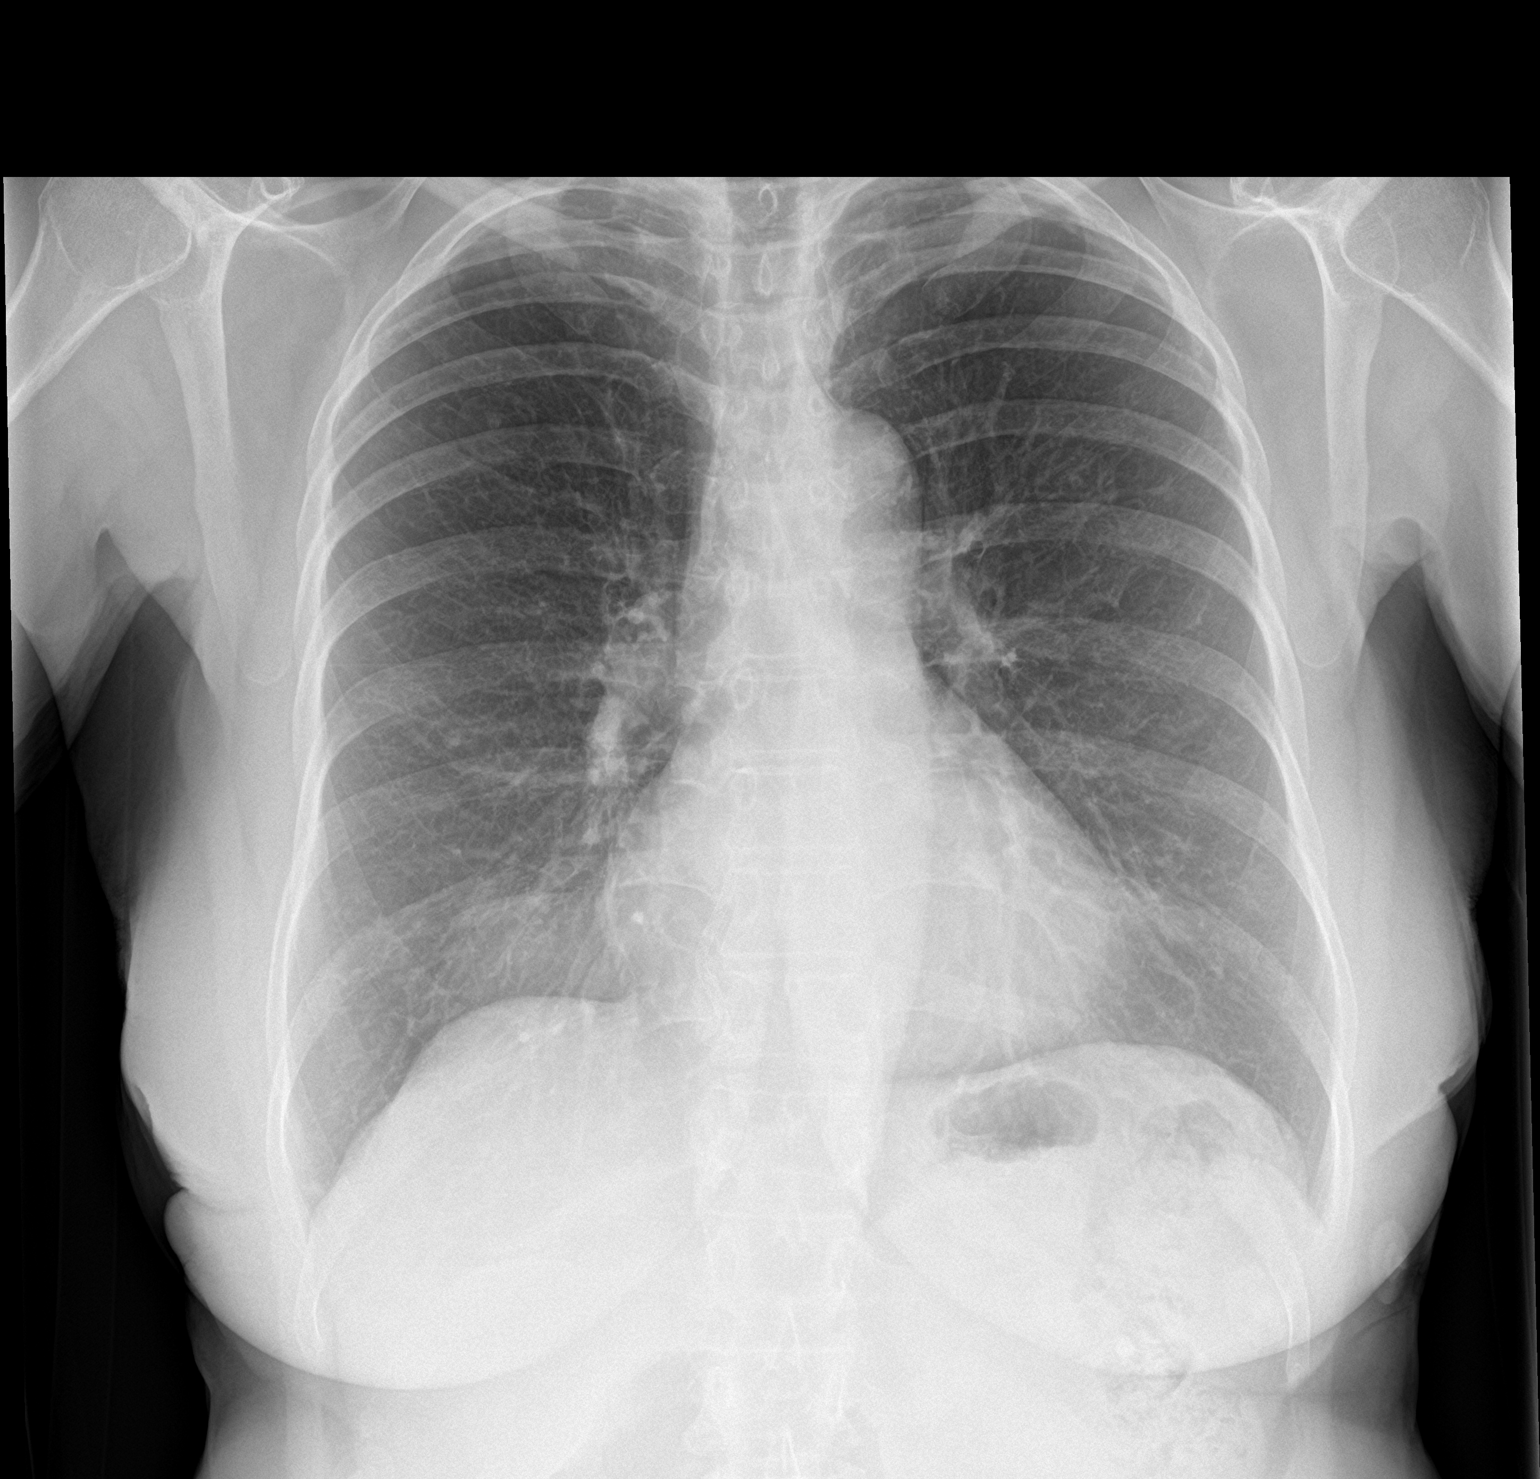

[chest lat]
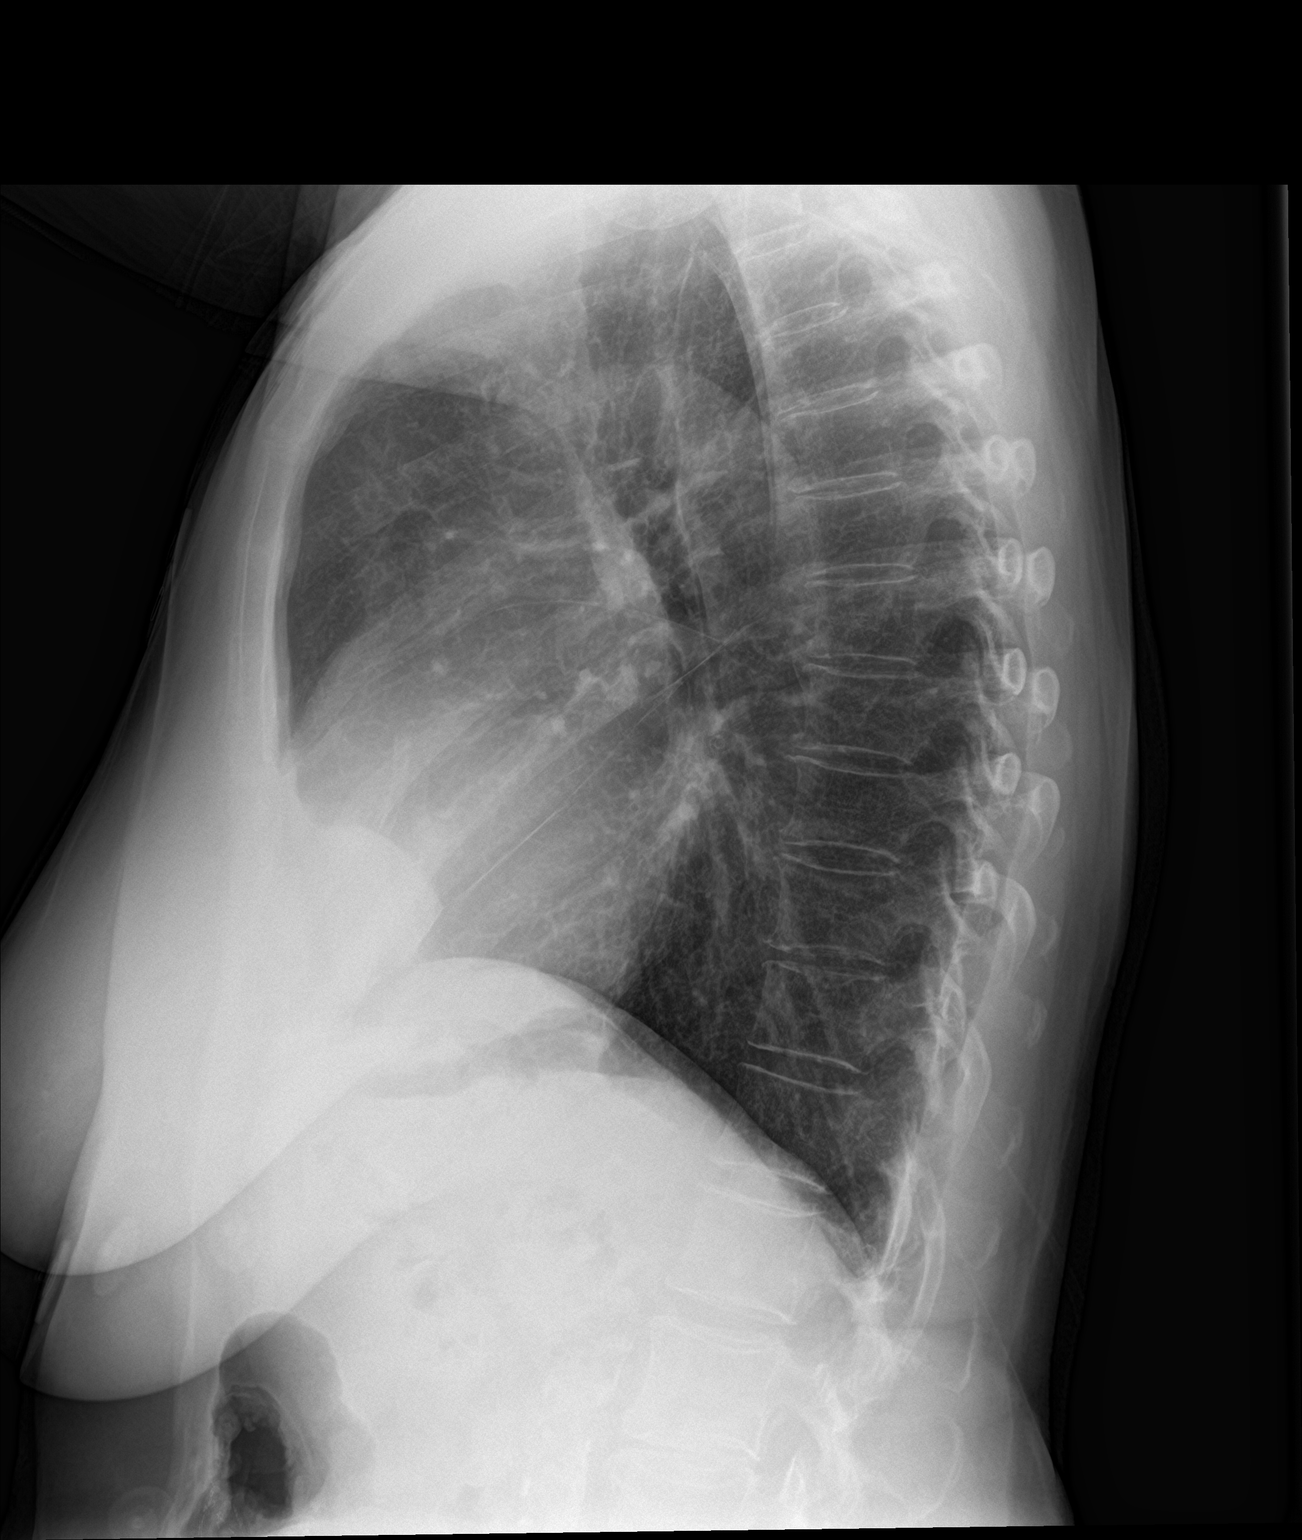

[2 of 2 positions shown; findings below may reference images not displayed]

FINDINGS: The heart size and mediastinal contours are within normal limits.
There is no evidence of pulmonary edema, consolidation,
pneumothorax, nodule or pleural fluid. The visualized skeletal
structures are unremarkable.
IMPRESSION: No active cardiopulmonary disease.

## 2021-07-05 ENCOUNTER — Other Ambulatory Visit: Payer: Self-pay | Admitting: Cardiology

## 2021-07-07 ENCOUNTER — Encounter: Payer: Self-pay | Admitting: Internal Medicine

## 2021-07-07 MED ORDER — SIMVASTATIN 40 MG PO TABS: 40.0000 mg | ORAL_TABLET | Freq: Every day | ORAL | 3 refills | Status: DC

## 2021-07-31 ENCOUNTER — Ambulatory Visit (INDEPENDENT_AMBULATORY_CARE_PROVIDER_SITE_OTHER): Payer: Medicare Other

## 2021-07-31 ENCOUNTER — Encounter (HOSPITAL_BASED_OUTPATIENT_CLINIC_OR_DEPARTMENT_OTHER): Payer: Self-pay | Admitting: Family

## 2021-07-31 ENCOUNTER — Other Ambulatory Visit: Payer: Self-pay

## 2021-07-31 ENCOUNTER — Ambulatory Visit (HOSPITAL_BASED_OUTPATIENT_CLINIC_OR_DEPARTMENT_OTHER): Payer: Medicare Other | Admitting: Physician Assistant

## 2021-07-31 VITALS — BP 134/88 | HR 63 | Ht 63.0 in | Wt 144.0 lb

## 2021-07-31 DIAGNOSIS — I2510 Atherosclerotic heart disease of native coronary artery without angina pectoris: Secondary | ICD-10-CM | POA: Diagnosis not present

## 2021-07-31 DIAGNOSIS — R002 Palpitations: Secondary | ICD-10-CM

## 2021-07-31 DIAGNOSIS — E785 Hyperlipidemia, unspecified: Secondary | ICD-10-CM

## 2021-07-31 DIAGNOSIS — I959 Hypotension, unspecified: Secondary | ICD-10-CM

## 2021-07-31 DIAGNOSIS — I10 Essential (primary) hypertension: Secondary | ICD-10-CM | POA: Diagnosis not present

## 2021-07-31 MED ORDER — ISOSORBIDE MONONITRATE ER 30 MG PO TB24: 15.0000 mg | ORAL_TABLET | Freq: Every day | ORAL | 1 refills | Status: DC

## 2021-07-31 NOTE — Patient Instructions (Signed)
Medication Instructions:   Your physician has recommended you make the following change in your medication:   START Isosorbide Mononitrate (Imdur) half tablet (15mg ) at bedtime.   *If you need a refill on your cardiac medications before your next appointment, please call your pharmacy*   Lab Work: Your physician recommends that you return for lab work in: CBC, BMP, TSH  If you have labs (blood work) drawn today and your tests are completely normal, you will receive your results only by: MyChart Message (if you have MyChart) OR A paper copy in the mail If you have any lab test that is abnormal or we need to change your treatment, we will call you to review the results.   Testing/Procedures:  Your physician has recommended that you wear a Zio monitor.   This monitor is a medical device that records the hearts electrical activity. Doctors most often use these monitors to diagnose arrhythmias. Arrhythmias are problems with the speed or rhythm of the heartbeat. The monitor is a small device applied to your chest. You can wear one while you do your normal daily activities. While wearing this monitor if you have any symptoms to push the button and record what you felt. Once you have worn this monitor for the period of time provider prescribed (for 7 days), you will return the monitor device in the postage paid box. Once it is returned they will download the data collected and provide with a report which the provider will then review and we will call you with those results. Important tips:  Avoid showering during the first 24 hours of wearing the monitor. Avoid excessive sweating to help maximize wear time. Do not submerge the device, no hot tubs, and no swimming pools. Keep any lotions or oils away from the patch. After 24 hours you may shower with the patch on. Take brief showers with your back facing the shower head.  Do not remove patch once it has been placed because that will interrupt  data and decrease adhesive wear time. Push the button when you have any symptoms and write down what you were feeling. Once you have completed wearing your monitor, remove and place into box which has postage paid and place in your outgoing mailbox.  If for some reason you have misplaced your box then call our office and we can provide another box and/or mail it off for you.   Follow-Up: At [facility], you and your health needs are our priority.  As part of our continuing mission to provide you with exceptional heart care, we have created designated Provider Care Teams.  These Care Teams include your primary Cardiologist (physician) and Advanced Practice Providers (APPs -  Physician Assistants and Nurse Practitioners) who all work together to provide you with the care you need, when you need it.  We recommend signing up for the patient portal called "MyChart".  Sign up information is provided on this After Visit Summary.  MyChart is used to connect with patients for Virtual Visits (Telemedicine).  Patients are able to view lab/test results, encounter notes, upcoming appointments, etc.  Non-urgent messages can be sent to your provider as well.   To learn more about what you can do with MyChart, go to [facility].    Your next appointment:   6-8 week(s)  The format for your next appointment:   In Person  Provider:   [email or web address], MD or Advanced Practice Provider   Other Instructions  Heart Healthy Diet Recommendations: A  low-salt diet is recommended. Meats should be grilled, baked, or boiled. Avoid fried foods. Focus on lean protein sources like fish or chicken with vegetables and fruits. The American Heart Association is a Chief Technology Officer!  American Heart Association Diet and Lifeystyle Recommendations    Exercise recommendations: The American Heart Association recommends 150 minutes of moderate intensity exercise weekly. Try 30 minutes of moderate intensity exercise 4-5  times per week. This could include walking, jogging, or swimming.

## 2021-07-31 NOTE — Progress Notes (Signed)
Office Visit    Patient Name: Sarah Pacheco Date of Encounter: 07/31/2021  PCP:  Patient, No Pcp Per (Inactive)   [facility]  Cardiologist:  Peter Swaziland, MD  Advanced Practice Provider:  No care team member to display Electrophysiologist:  None      Chief Complaint    Sarah Pacheco is a 75 y.o. female with a hx of CAD s/p inferior STEMI complicated by VF arrest and cardiogenic shock 07/2019 requiring DES X2 with ISR 05/2020 and 08/2020, hyperlipidemia, GERD, anxiety, depression, OA presents today for follow-up of coronary disease  Past Medical History    Past Medical History:  Diagnosis Date   Anxiety    Arthritis    CAD (coronary artery disease)    a. 07/2019 Inf STEMI/VF Arrest/CGS/PCI: LM nl, LADmin irregs, LCX nl, RCA 100p/m (2.75x38 & 2.75x26 Resolute Onyx DESs).   Cataract    Chest pain    Depression    GERD (gastroesophageal reflux disease)    History of echocardiogram    a. 07/2019 Echo: EF 60-65%, no rwma, nl RV size/fxn, mildly dil LA, mild MR.   Hyperlipidemia with target LDL less than 70 10/25/2015   Past Surgical History:  Procedure Laterality Date   APPENDECTOMY     CATARACT EXTRACTION, BILATERAL     COLONOSCOPY  2017   X2     colon polyps   CORONARY BALLOON ANGIOPLASTY N/A 06/04/2020   Procedure: CORONARY BALLOON ANGIOPLASTY;  Surgeon: Corky Crafts, MD;  Location: [facility] INVASIVE CV LAB;  Service: Cardiovascular;  Laterality: N/A;   CORONARY STENT INTERVENTION N/A 08/28/2020   Procedure: CORONARY STENT INTERVENTION;  Surgeon: Marykay Lex, MD;  Location: [facility] INVASIVE CV LAB;  Service: Cardiovascular;  Laterality: N/A;   CORONARY/GRAFT ACUTE MI REVASCULARIZATION N/A 07/11/2019   Procedure: CORONARY/GRAFT ACUTE MI REVASCULARIZATION;  Surgeon: Swaziland, Peter M, MD;  Location: [facility] INVASIVE CV LAB;  Service: Cardiovascular;  Laterality: N/A;   INTRAVASCULAR ULTRASOUND/IVUS N/A 06/04/2020   Procedure: Intravascular  Ultrasound/IVUS;  Surgeon: Corky Crafts, MD;  Location: [facility] INVASIVE CV LAB;  Service: Cardiovascular;  Laterality: N/A;   LAPAROTOMY N/A 10/10/2018   Procedure: EXPLORATORY LAPAROTOMY RIGHT COLECTOMY;  Surgeon: Emelia Loron, MD;  Location: [facility] ORS;  Service: General;  Laterality: N/A;   LEFT HEART CATH AND CORONARY ANGIOGRAPHY N/A 07/11/2019   Procedure: LEFT HEART CATH AND CORONARY ANGIOGRAPHY;  Surgeon: Swaziland, Peter M, MD;  Location: [facility] INVASIVE CV LAB;  Service: Cardiovascular;  Laterality: N/A;   LEFT HEART CATH AND CORONARY ANGIOGRAPHY N/A 06/04/2020   Procedure: LEFT HEART CATH AND CORONARY ANGIOGRAPHY;  Surgeon: Corky Crafts, MD;  Location: [facility] INVASIVE CV LAB;  Service: Cardiovascular;  Laterality: N/A;   LEFT HEART CATH AND CORONARY ANGIOGRAPHY N/A 08/28/2020   Procedure: LEFT HEART CATH AND CORONARY ANGIOGRAPHY;  Surgeon: Marykay Lex, MD;  Location: [facility] INVASIVE CV LAB;  Service: Cardiovascular;  Laterality: N/A;   TEMPORARY PACEMAKER N/A 07/11/2019   Procedure: TEMPORARY PACEMAKER;  Surgeon: Swaziland, Peter M, MD;  Location: [facility] INVASIVE CV LAB;  Service: Cardiovascular;  Laterality: N/A;   VAGINAL HYSTERECTOMY  1973   AUB, fibroids    Allergies  Allergies  Allergen Reactions   Atorvastatin Other (See Comments)    Cramps in legs   Codeine Nausea And Vomiting   Erythromycin Base Nausea Only   Penicillin G Hives    Did it involve swelling of the face/tongue/throat, SOB, or low BP? No Did it involve sudden  or severe rash/hives, skin peeling, or any reaction on the inside of your mouth or nose? Yes Did you need to seek medical attention at a hospital or doctor's office? Yes When did it last happen?  2010 If all above answers are "NO", may proceed with cephalosporin use.    Rosuvastatin Other (See Comments)    Muscle pain    History of Present Illness    Sarah Pacheco is a 75 y.o. female with a hx of CAD s/p inferior STEMI complicated by VF arrest and  cardiogenic shock 07/2019 requiring DES X2 with ISR 05/2020 and 08/2020, hyperlipidemia, GERD, anxiety, depression, OA  last seen 12/30/2020 by Dr. Swaziland.  In October 21 she began to notice intermittent substernal chest discomfort.  She was seen in the ED 06/02/2019 with normal troponin x2.  EKG with subtle inferolateral ST depression.  She underwent LHC 05/2020 showing severe ISR of RCA.  She had scoring balloon PTCA.  Due to high intensity statin intolerant she was started on Repatha.  She presented 08/2020 with worsening chest pain.  Repeat cardiac catheterization 08/28/2020 with severe to slight recurrent ISR of proximal-distal RCA treated with scoring balloon and balloon angioplasty as well as 2 additional overlapping DES.  She was recommended for lifelong DAPT.  Due to optimal lipid control study was able to be discontinued.  She was seen 12/30/2020 by Dr. Swaziland.  Noted improvement in depression after starting medications.  She was able to be more active.  She had intermittent pain in her arm that responded to nitroglycerin.  Due to some dyspnea her Brilinta was transitioned to Plavix.  Low-dose metoprolol was discontinued today due to relative hypotension and fatigue.  She presents today for follow-up. She continues to have severe chest pain about once a month where she needs to take nitro. These severe episodes consist of chest pain and associated pain down bilateral arms. She also has less severe episodes where she has just chest tightness across her chest and these usually spontaneously resolve with a deep breath. She does not take nitro for these episodes. She also states there are periods of time where she has dyspnea on exertion especially walking up her stairs. She does have periods of hypotension where her SBP is in the 90s and she feels fatigued and sometimes has positional dizziness. She endorses palpitations where she feels like her heart is racing which occurs always in the morning when she  first wakes up. She usually has to stop what she is doing and sit down and it will resolve. She also has periods of time where she feels like her heart skips a beat. Lipid panel reviewed with the patient (11/2020) and all her numbers looked good at that time. We discussed doing a Zio monitor for 7 days since she is going out of town for the month of January. She also wanted to try medication for her chest pain at this time and wait to do cardiac cath. We have started Imdur 15mg  daily at night for her chest pain. She will follow-up in Feb and will likely need another cardiac cath at this time.   No edema, orthopnea, PND.    EKGs/Labs/Other Studies Reviewed:   The following studies were reviewed today:  Cardiac cath 06/04/20 .Prox RCA to Mid RCA lesion is 99% stenosed. Mid RCA lesion is 95% stenosed. Scoring balloon angioplasty was performed using a BALLOON WOLVERINE 3.00X10, followed by a 3.5 [place] balloon, on both lesions. Post intervention, there is a 0% residual  stenosis. The left ventricular systolic function is normal. LV end diastolic pressure is normal. The left ventricular ejection fraction is 55-65% by visual estimate. There is no aortic valve stenosis.   Continue aggressive secondary prevention.  Patient with some residual pain, likely related to some decreased flow in marginal branches.        Left heart cath 08/28/20: The left ventricular systolic function is normal. The left ventricular ejection fraction is 50-55% by visual estimate. LV end diastolic pressure is low. -------------------- Proximal RCA stent is 95% stenosed, in-stent restenosis (subsequent) Mid-distal RCA stent is 75% stenosed.-In-stent restenosis (subsequent) Scoring balloon angioplasty was performed on both lesions using a BALLOON WOLVERINE 3.00X15. Noncompliant balloon angioplasty balloon angioplasty was performed using a 3.5 mm by 15 mm Morrison balloon. A drug-eluting stent was successfully placed open from the  distal stent edge to the mid stented segment, using a SYNERGY XD 3.50X38. Postdilated to 3.8 mm A second drug-eluting stent was successfully placed overlapping the original stent and the new stent for the proximal segment, using a SYNERGY XD 3.50X28. Postdilated to 3.8 mm Post intervention, there is a 0% residual stenosis throughout the entire stented segment (the entire old stent segment was covered with new stents). -------------------------------------- Dist RCA lesion is 20% stenosed. Otherwise minimal LCA disease.   SUMMARY Severe 2 site recurrent in-stent restenosis of the extensive proximal to distal RCA stents -> proximal-mid 95% ISR, distal stent 75% ISR Successful Scoring Balloon followed by Winthrop Balloon Angioplasty lesion preparation for 2 additional overlapping stents placed covering the 2 old stents using Synergy DES 3.5 mm x 38 and 3.5 mm by 28 mm stent postdilated to 3.8 mm. Relatively normal left coronary arteries-no change Normal LV function and EDP.     RECOMMENDATIONS Return to nursing unit for ongoing care.  Anticipate discharge in the morning. [facility] 1 reconsulted Continue aggressive risk modification Lifelong Thienopyridine antiplatelet agent.    EKG:  EKG is ordered today.  The ekg ordered today demonstrates NSR. Stable compared to previous EKG  Recent Labs: 08/27/2020: ALT 13; TSH 1.941 09/04/2020: BUN 11; Creatinine, Ser 1.02; Hemoglobin 10.4; Platelets 326; Potassium 5.2; Sodium 142  Recent Lipid Panel    Component Value Date/Time   CHOL 117 12/04/2020 0957   TRIG 77 12/04/2020 0957   HDL 61 12/04/2020 0957   CHOLHDL 1.9 12/04/2020 0957   CHOLHDL 1.4 08/27/2020 2213   VLDL 6 08/27/2020 2213   LDLCALC 41 12/04/2020 0957    Home Medications   Current Meds  Medication Sig   acetaminophen (TYLENOL) 500 MG tablet Take 1,000 mg by mouth every 6 (six) hours as needed for mild pain or headache.    aspirin EC 81 MG EC tablet Take 1 tablet (81 mg total) by mouth  daily.   clopidogrel (PLAVIX) 75 MG tablet Take 1 tablet (75 mg total) by mouth daily.   Cyanocobalamin (VITAMIN B-12) 5000 MCG TBDP Take 5,000 mcg by mouth daily.    escitalopram (LEXAPRO) 10 MG tablet TAKE 1 TABLET(10 MG) BY MOUTH DAILY   isosorbide mononitrate (IMDUR) 30 MG 24 hr tablet Take 0.5 tablets (15 mg total) by mouth daily.   nitroGLYCERIN (NITROSTAT) 0.4 MG SL tablet Place 1 tablet (0.4 mg total) under the tongue every 5 (five) minutes as needed for chest pain (up to 3 doses. If taking 3rd dose call 911).   Omega-3 Fatty Acids (EQL OMEGA 3 FISH OIL) 1200 MG CAPS Take 1,200 mg by mouth daily.   pantoprazole (PROTONIX) 40 MG tablet  Take 1 tablet (40 mg total) by mouth daily.   REPATHA SURECLICK 140 MG/ML SOAJ INJECT 1 DOSE UNDER THE SKIN EVERY 14 DAYS   simvastatin (ZOCOR) 40 MG tablet Take 1 tablet (40 mg total) by mouth daily.     Review of Systems     All other systems reviewed and are otherwise negative except as noted above.  Physical Exam    VS:  BP 134/88    Pulse 63    Ht  (1.6 m)    Wt 144 lb (65.3 kg)    BMI 25.51 kg/m  , BMI Body mass index is 25.51 kg/m.  Wt Readings from Last 3 Encounters:  07/31/21 144 lb (65.3 kg)  12/30/20 142 lb (64.4 kg)  12/09/20 143 lb 12.8 oz (65.2 kg)     GEN: Well nourished, well developed, in no acute distress. HEENT: normal. Cardiac: RRR, no murmurs, rubs, or gallops. No clubbing, cyanosis, edema.  Radials/PT 2+ and equal bilaterally.  Respiratory:  Respirations regular and unlabored, clear to auscultation bilaterally. GI: Soft, nontender, nondistended. MS: No deformity or atrophy. Skin: Warm and dry, no rash. Neuro:  Strength and sensation are intact. Psych: Normal affect.  Assessment & Plan    CAD-stenting to the RCA 2020 for acute inferior MI with V. fib arrest and shock.  She returned in October 2021 for unstable angina with in-stent restenosis treated with scoring balloon angioplasty.  She presented 08/2020 with  angina and ISR requiring Cutting Balloon angioplasty and overlapping repeat stents. She is still having significant chest pain once a month requiring sublingual nitro and less severe episodes occurring a few times a week but they resolve on their own with a deep breath.  We have added Imdur . Ranexa discussed but this would require repeat EKG in 2 weeks and she will be in [place] on vacation. She has not had as much dyspnea but still has some SOB while climbing stairs.  If her chest pain does not improve she will likely need repeat cardiac cath.  Occasional Hypotension- she has had some readings at home of hypotension where she experiences some fatigue and positional dizziness. She will take her Imdur at night and she is only on  so hopefull this will be well tolerated.   LDL goal less than 70- Intolerance to high dose statin. On Simvastatin and Repatha .11/2020 LDL 41.  Repeat Lipid panel when she sees Dr. Rennis Golden in the lipid clinic in April 2023  HTN - Well controlled today in the clinic. Her metoprolol was discontinued and she is not on any other antihypertensive agents.  Palpitations- these occur mostly in the morning and resolve on their own. She also has periods of time when she feels like her heart skips a beat. She has never worn a monitor. We will order labs today BMP, TSH, CBC and order a Zio monitor for 7 days (she will be out of town after that).   Disposition: Follow up  in 6-8 weeks with Dr. Swaziland or APP.  Signed, Sharlene Dory, PA-C 07/31/2021, 4:21 PM [facility]

## 2021-08-01 LAB — BASIC METABOLIC PANEL
BUN/Creatinine Ratio: 16 (ref 12–28)
BUN: 20 mg/dL (ref 8–27)
CO2: 20 mmol/L (ref 20–29)
Calcium: 9.5 mg/dL (ref 8.7–10.3)
Chloride: 104 mmol/L (ref 96–106)
Creatinine, Ser: 1.24 mg/dL — ABNORMAL HIGH (ref 0.57–1.00)
Glucose: 87 mg/dL (ref 70–99)
Potassium: 4.8 mmol/L (ref 3.5–5.2)
Sodium: 140 mmol/L (ref 134–144)
eGFR: 46 mL/min/{1.73_m2} — ABNORMAL LOW (ref >59)

## 2021-08-01 LAB — CBC
Hematocrit: 35.8 % (ref 34.0–46.6)
Hemoglobin: 11.5 g/dL (ref 11.1–15.9)
MCH: 27.5 pg (ref 26.6–33.0)
MCHC: 32.1 g/dL (ref 31.5–35.7)
MCV: 86 fL (ref 79–97)
Platelets: 287 10*3/uL (ref 150–450)
RBC: 4.18 x10E6/uL (ref 3.77–5.28)
RDW: 15.7 % — ABNORMAL HIGH (ref 11.7–15.4)
WBC: 8 10*3/uL (ref 3.4–10.8)

## 2021-08-01 LAB — TSH: TSH: 1.98 u[IU]/mL (ref 0.450–4.500)

## 2021-08-05 NOTE — Progress Notes (Signed)
Seen by patient Sarah Pacheco on 08/01/2021  4:36 PM

## 2021-08-18 ENCOUNTER — Ambulatory Visit: Payer: Medicare Other | Admitting: Cardiology

## 2021-09-10 NOTE — Progress Notes (Signed)
Cardiology Office Note:    Date:  09/11/2021   ID:  Sarah Pacheco, DOB [birth year], MRN [id number]  PCP:  Patient, No Pcp Per (Inactive)   [facility] Cardiologist:  Peter Martinique, MD     Referring MD: No ref. provider found   Chief Complaint:   History of Present Illness:    Sarah Pacheco is a 76 y.o. female with a hx of MI, CAD, hyperlipidemia, GERD, anemia, anxiety, depression, and osteoarthritis.   Prior inferior STEMI complicated by VF arrest (during cath) and cardiogenic shock 12/20 requiring DES x 2. Echo revealed nl LVEF without rwma. In October 2021, she began experiencing intermittent substernal chest discomfort notable when lying down and with activity.  Symptoms improved with sitting up and SL nitroglycerin.  ED on 06/01/2020 normal troponins x2, EKG showed subtle inferior lateral ST depression.  She was pain-free and was subsequently discharged. Unfortunately she continued to have intermittent chest heaviness associated with occasional diaphoresis and radiation to the shoulders and down both arms, symptoms reminiscent of angina prior to MI. Admitted and underwent LHC which revealed severe in-stent restenosis of the RCA treated with scoring balloon PTCA, LVEF 55 to 65%.  Due to in-stent restenosis she was referred to lipid clinic and started on PCSK9 inhibitor.    She presented again to the ED on 08/27/20 with worsening chest pain described as a burning pain that radiates down both arms, similar to pain prior to MI, but not as severe.  Her pain was relieved with 3 sublingual nitroglycerin. LHC revealed severe 2 site recurrent in-stent restenosis of the extensive proximal to distal RCA stents proximal to mid 95% ISR, distal stent 75% ISR.  She had successful scoring balloon and angioplasty for 2 additional overlapping stents covering the 2 old stents. No change in left coronary arteries, normal LV function and EDP. Lifelong DAPT was recommended. She understandably reported  struggling emotionally with the prior events related to her heart.   She was last seen on 07/31/21 by Nicholes Rough, PA for evaluation of chest pain radiating down both arms once a month requiring nitro.  Also having less severe episodes of chest tightness across chest that resolved with deep breathing. Dyspnea especially walking up her stairs.  Periods of hypotension with SBP in the 90s, fatigue and positional dizziness.  Occasional palpitations in the morning; these resolve after resting for a few minutes. Also described heart skips a beat. ZIO monitor reevealed NSR, rare PACs and PVCs (<1%), infrequent short runs of PAT, no a fib, intermittent bundle branch block. There was discussion of starting Ranexa but it would require a 2-week EKG for which she would not be in town.  Imdur 15 mg at bedtime was added.  Lab work revealed stable electrolytes, labile blood counts. Creatinine was slightly elevated at 1.24. Advised to follow-up in 6-8 weeks for discussion of potential repeat cardiac cath if pain persists.   Today, she is here alone for follow-up. She reports most bothersome symptom is palpitations and waking up during the night and in the morning with her heart racing.  Has a history of night terrors but denies snoring or apneic episodes.  When asked about daytime somnolence she states "I'm always tired." Is not sleeping well. She is aching all over however this feels different from heaviness in both arms that is her angina equivalent.  Feels stable in regards to chest pain, activity tolerance, dyspnea. "I don't think I will ever be the same as prior to the heart  attack."  Took low-dose Imdur on occasion but could not tell an improvement and had a terrible headache. She is not exercising regularly but notes dyspnea with climbing stairs. This is not new. She denies lower extremity edema, melena, hematuria, hemoptysis, diaphoresis, weakness, presyncope, syncope, orthopnea, and PND.   Past Medical History:   Diagnosis Date   Anxiety    Arthritis    CAD (coronary artery disease)    a. 07/2019 Inf STEMI/VF Arrest/CGS/PCI: LM nl, LADmin irregs, LCX nl, RCA 100p/m (2.75x38 & 2.75x26 Resolute Onyx DESs).   Cataract    Chest pain    Depression    GERD (gastroesophageal reflux disease)    History of echocardiogram    a. 07/2019 Echo: EF 60-65%, no rwma, nl RV size/fxn, mildly dil LA, mild MR.   Hyperlipidemia with target LDL less than 70 10/25/2015    Past Surgical History:  Procedure Laterality Date   APPENDECTOMY     CATARACT EXTRACTION, BILATERAL     COLONOSCOPY  2017   X2     colon polyps   CORONARY BALLOON ANGIOPLASTY N/A 06/04/2020   Procedure: CORONARY BALLOON ANGIOPLASTY;  Surgeon: Jettie Booze, MD;  Location: [facility];  Service: Cardiovascular;  Laterality: N/A;   CORONARY STENT INTERVENTION N/A 08/28/2020   Procedure: CORONARY STENT INTERVENTION;  Surgeon: Leonie Man, MD;  Location: [facility];  Service: Cardiovascular;  Laterality: N/A;   CORONARY/GRAFT ACUTE MI REVASCULARIZATION N/A 07/11/2019   Procedure: CORONARY/GRAFT ACUTE MI REVASCULARIZATION;  Surgeon: Martinique, Peter M, MD;  Location: [facility];  Service: Cardiovascular;  Laterality: N/A;   INTRAVASCULAR ULTRASOUND/IVUS N/A 06/04/2020   Procedure: Intravascular Ultrasound/IVUS;  Surgeon: Jettie Booze, MD;  Location: [facility];  Service: Cardiovascular;  Laterality: N/A;   LAPAROTOMY N/A 10/10/2018   Procedure: EXPLORATORY LAPAROTOMY RIGHT COLECTOMY;  Surgeon: Rolm Bookbinder, MD;  Location: [facility];  Service: General;  Laterality: N/A;   LEFT HEART CATH AND CORONARY ANGIOGRAPHY N/A 07/11/2019   Procedure: LEFT HEART CATH AND CORONARY ANGIOGRAPHY;  Surgeon: Martinique, Peter M, MD;  Location: [facility];  Service: Cardiovascular;  Laterality: N/A;   LEFT HEART CATH AND CORONARY ANGIOGRAPHY N/A 06/04/2020   Procedure: LEFT HEART CATH AND CORONARY ANGIOGRAPHY;  Surgeon:  Jettie Booze, MD;  Location: [facility];  Service: Cardiovascular;  Laterality: N/A;   LEFT HEART CATH AND CORONARY ANGIOGRAPHY N/A 08/28/2020   Procedure: LEFT HEART CATH AND CORONARY ANGIOGRAPHY;  Surgeon: Leonie Man, MD;  Location: [facility];  Service: Cardiovascular;  Laterality: N/A;   TEMPORARY PACEMAKER N/A 07/11/2019   Procedure: TEMPORARY PACEMAKER;  Surgeon: Martinique, Peter M, MD;  Location: [facility];  Service: Cardiovascular;  Laterality: N/A;   VAGINAL HYSTERECTOMY  1973   AUB, fibroids    Current Medications: Current Meds  Medication Sig   acetaminophen (TYLENOL) 500 MG tablet Take 1,000 mg by mouth every 6 (six) hours as needed for mild pain or headache.    aspirin EC 81 MG EC tablet Take 1 tablet (81 mg total) by mouth daily.   clopidogrel (PLAVIX) 75 MG tablet Take 1 tablet (75 mg total) by mouth daily.   Cyanocobalamin (VITAMIN B-12) 5000 MCG TBDP Take 5,000 mcg by mouth daily.    escitalopram (LEXAPRO) 10 MG tablet TAKE 1 TABLET(10 MG) BY MOUTH DAILY   isosorbide mononitrate (IMDUR) 30 MG 24 hr tablet Take 0.5 tablets (15 mg total) by mouth daily. (Patient  taking differently: Take 30 mg by mouth as needed.)   metoprolol succinate (TOPROL-XL) 25 MG 24 hr tablet Take 1 tablet (25 mg total) by mouth at bedtime. Metorpolol Succinate 12.5mg  daily at bedtime. If you are tolerating this in 1-2 weeks you may increase your dose to 25mg  daily at bedtime!   nitroGLYCERIN (NITROSTAT) 0.4 MG SL tablet Place 1 tablet (0.4 mg total) under the tongue every 5 (five) minutes as needed for chest pain (up to 3 doses. If taking 3rd dose call 911).   Omega-3 Fatty Acids (EQL OMEGA 3 FISH OIL) 1200 MG CAPS Take 1,200 mg by mouth daily.   pantoprazole (PROTONIX) 40 MG tablet Take 1 tablet (40 mg total) by mouth daily.   REPATHA SURECLICK XX123456 MG/ML SOAJ INJECT 1 DOSE UNDER THE SKIN EVERY 14 DAYS   simvastatin (ZOCOR) 40 MG tablet Take 1 tablet (40 mg total) by  mouth daily.     Allergies:   Atorvastatin, Codeine, Erythromycin base, Penicillin g, and Rosuvastatin   Social History   Socioeconomic History   Marital status: Divorced    Spouse name: Not on file   Number of children: Not on file   Years of education: Not on file   Highest education level: Not on file  Occupational History   Not on file  Tobacco Use   Smoking status: Former    Packs/day: 0.50    Years: 15.00    Pack years: 7.50    Types: Cigarettes   Smokeless tobacco: Never   Tobacco comments:    Quit around age 44  Vaping Use   Vaping Use: Never used  Substance and Sexual Activity   Alcohol use: No   Drug use: No   Sexual activity: Not on file  Other Topics Concern   Not on file  Social History Narrative   Lives locally with husband.   Social Determinants of Health   Financial Resource Strain: Not on file  Food Insecurity: Not on file  Transportation Needs: Not on file  Physical Activity: Not on file  Stress: Not on file  Social Connections: Not on file     Family History: The patient's family history includes COPD in her brother; Colon cancer (age of onset: 68) in her brother and sister; Colon polyps in her brother and sister; Heart attack in her sister; Heart attack (age of onset: 32) in her brother; Heart attack (age of onset: 71) in her father; Heart disease in her brother, father, sister, and sister; Heart failure in her brother; Lung cancer in her brother. There is no history of Esophageal cancer, Rectal cancer, or Stomach cancer.  ROS:   Please see the history of present illness.    +palpitations +dyspnea on exertion All other systems reviewed and are negative.  Labs/Other Studies Reviewed:    The following studies were reviewed today:  LHC 10/21  Prox RCA to Mid RCA lesion is 99% stenosed. Mid RCA lesion is 95% stenosed. Scoring balloon angioplasty was performed using a BALLOON WOLVERINE 3.00X10, followed by a 3.5 [place] balloon, on both  lesions. Post intervention, there is a 0% residual stenosis. The left ventricular systolic function is normal. LV end diastolic pressure is normal. The left ventricular ejection fraction is 55-65% by visual estimate. There is no aortic valve stenosis.   Continue aggressive secondary prevention.  Patient with some residual pain, likely related to some decreased flow in marginal branches.    LHC 1/22  The left ventricular systolic function is normal. The left  ventricular ejection fraction is 50-55% by visual estimate. LV end diastolic pressure is low. -------------------- Proximal RCA stent is 95% stenosed, in-stent restenosis (subsequent) Mid-distal RCA stent is 75% stenosed.-In-stent restenosis (subsequent) Scoring balloon angioplasty was performed on both lesions using a BALLOON WOLVERINE 3.00X15. Noncompliant balloon angioplasty balloon angioplasty was performed using a 3.5 mm by 15 mm [place] balloon. A drug-eluting stent was successfully placed open from the distal stent edge to the mid stented segment, using a SYNERGY XD 3.50X38. Postdilated to 3.8 mm A second drug-eluting stent was successfully placed overlapping the original stent and the new stent for the proximal segment, using a SYNERGY XD 3.50X28. Postdilated to 3.8 mm Post intervention, there is a 0% residual stenosis throughout the entire stented segment (the entire old stent segment was covered with new stents). -------------------------------------- Dist RCA lesion is 20% stenosed. Otherwise minimal LCA disease.   SUMMARY Severe 2 site recurrent in-stent restenosis of the extensive proximal to distal RCA stents -> proximal-mid 95% ISR, distal stent 75% ISR Successful Scoring Balloon followed by [place] Balloon Angioplasty lesion preparation for 2 additional overlapping stents placed covering the 2 old stents using Synergy DES 3.5 mm x 38 and 3.5 mm by 28 mm stent postdilated to 3.8 mm. Relatively normal left coronary arteries-no  change Normal LV function and EDP.     RECOMMENDATIONS Return to nursing unit for ongoing care.  Anticipate discharge in the morning. [place] 1 reconsulted Continue aggressive risk modification Lifelong Thienopyridine antiplatelet agent.    Diagnostic Dominance: Right Intervention     Echo 12/20   Left Ventricle: Left ventricular ejection fraction, by visual estimation,  is 60 to 65%. The left ventricle has normal function. The left ventricular  internal cavity size was the left ventricle is normal in size. There is no  left ventricular hypertrophy.  Left ventricular diastolic parameters are indeterminate.  Right Ventricle: The right ventricular size is normal. No increase in  right ventricular wall thickness. Global RV systolic function is has  normal systolic function.  Left Atrium: Left atrial size was mildly dilated.  Right Atrium: Right atrial size was normal in size  Pericardium: There is no evidence of pericardial effusion.  Mitral Valve: The mitral valve is normal in structure. Mild mitral valve  regurgitation.  Tricuspid Valve: The tricuspid valve is normal in structure. Tricuspid  valve regurgitation is not demonstrated.  Aortic Valve: The aortic valve is tricuspid. Aortic valve regurgitation is  not visualized. The aortic valve is structurally normal, with no evidence  of sclerosis or stenosis.  Pulmonic Valve: The pulmonic valve was grossly normal. Pulmonic valve  regurgitation is trivial.  Aorta: The aortic root and ascending aorta are structurally normal, with  no evidence of dilitation.  Venous: The inferior vena cava is normal in size with greater than 50%  respiratory variability, suggesting right atrial pressure of 3 mmHg.  IAS/Shunts: The atrial septum is grossly normal.   Recent Labs: 07/31/2021: BUN 20; Creatinine, Ser 1.24; Hemoglobin 11.5; Platelets 287; Potassium 4.8; Sodium 140; TSH 1.980  Recent Lipid Panel    Component Value Date/Time   CHOL  117 12/04/2020 0957   TRIG 77 12/04/2020 0957   HDL 61 12/04/2020 0957   CHOLHDL 1.9 12/04/2020 0957   CHOLHDL 1.4 08/27/2020 2213   VLDL 6 08/27/2020 2213   LDLCALC 41 12/04/2020 0957     Risk Assessment/Calculations:      Physical Exam:    VS:  BP 138/70    Pulse 69    Ht  5\' 2"  (1.575 m)    Wt 146 lb 14.4 oz (66.6 kg)    SpO2 95%    BMI 26.87 kg/m     Wt Readings from Last 3 Encounters:  09/11/21 146 lb 14.4 oz (66.6 kg)  07/31/21 144 lb (65.3 kg)  12/30/20 142 lb (64.4 kg)     GEN:  Well nourished, well developed in no acute distress HEENT: Normal NECK: No JVD; No carotid bruits CARDIAC: RRR, no murmurs, rubs, gallops RESPIRATORY:  Clear to auscultation without rales, wheezing or rhonchi  ABDOMEN: Soft, non-tender, non-distended MUSCULOSKELETAL:  No edema; No deformity.  SKIN: Warm and dry NEUROLOGIC:  Alert and oriented x 3 PSYCHIATRIC:  Normal affect   EKG:  EKG is not ordered today.   Diagnoses:    1. Palpitations   2. Coronary artery disease of native artery of native heart with stable angina pectoris ([place])   3. Hyperlipidemia LDL goal <70    Assessment and Plan:     CAD with stable angina: She feels that her symptoms of angina are stable without significant bilateral arm heaviness similar to prior MI. Palpitations and difficulty sleeping are most bothersome to her. She would like to resume metoprolol prior to further ischemia testing or intervention.  Encouraged her to resume exercise and to notify us if angina increases with activity. Taking Imdur intermittently due to headache. Trial 15 mg QHS with scheduled Tylenol for a few days. Is still having headache, stop Imdur. Reinitiating Toprol as indicated below.  Encouraged her to continue to monitor symptoms and to call back if symptoms worsen or she has other concerns. Will plan for soon return visit. Continue statin, Plavix, aspirin.   Palpitations: Monitor with episodes of PAT. Palpitations are very  bothersome to her and are interfering with sleep.  Metoprolol previously d/ced due to fatigue and low BP but is agreeable to retry with nighttime dosing. We will start with 12.5 mg of metoprolol succinate with instructions to increase to 25 mg if well-tolerated.   Hyperlipidemia LDL goal < 70: LDL 41 12/04/20. Is managed by Dr. Debara Pickett and is due for appointment in May; appointment scheduled. Lipid order given for testing prior to appointment. Continue Repatha, simvastatin.   Disposition: 2 months with Dr. Martinique   Medication Adjustments/Labs and Tests Ordered: Current medicines are reviewed at length with the patient today.  Concerns regarding medicines are outlined above.  Orders Placed This Encounter  Procedures   Lipid panel   Meds ordered this encounter  Medications   metoprolol succinate (TOPROL-XL) 25 MG 24 hr tablet    Sig: Take 1 tablet (25 mg total) by mouth at bedtime. Metorpolol Succinate 12.5mg  daily at bedtime. If you are tolerating this in 1-2 weeks you may increase your dose to 25mg  daily at bedtime!    Dispense:  90 tablet    Refill:  3    Patient Instructions  Medication Instructions:  Your physician has recommended you make the following change in your medication:   Restart: Metorpolol Succinate 12.5mg  daily at bedtime. If you are tolerating this in 1-2 weeks you may increase your dose to 25mg  daily at bedtime!   *If you need a refill on your cardiac medications before your next appointment, please call your pharmacy*   Lab Work: Your physician recommends that you return for lab work Before May appointment with Dr. Debara Pickett at the [facility] in [place]   If you have labs (blood work) drawn today and your tests are completely normal, you  will receive your results only by: [place] (if you have MyChart) OR A paper copy in the mail If you have any lab test that is abnormal or we need to change your treatment, we will call you to review the  results.   Testing/Procedures: None ordered today    Follow-Up: At [facility], you and your health needs are our priority.  As part of our continuing mission to provide you with exceptional heart care, we have created designated Provider Care Teams.  These Care Teams include your primary Cardiologist (physician) and Advanced Practice Providers (APPs -  Physician Assistants and Nurse Practitioners) who all work together to provide you with the care you need, when you need it.  We recommend signing up for the patient portal called "MyChart".  Sign up information is provided on this After Visit Summary.  MyChart is used to connect with patients for Virtual Visits (Telemedicine).  Patients are able to view lab/test results, encounter notes, upcoming appointments, etc.  Non-urgent messages can be sent to your provider as well.   To learn more about what you can do with MyChart, go to [email or web address].    Your next appointment:   As scheduled with Dr. Debara Pickett and Dr. Martinique!     Signed, Emmaline Life, NP  09/11/2021 4:51 PM    [facility]

## 2021-09-11 ENCOUNTER — Encounter (HOSPITAL_BASED_OUTPATIENT_CLINIC_OR_DEPARTMENT_OTHER): Payer: Self-pay | Admitting: Nurse Practitioner

## 2021-09-11 ENCOUNTER — Other Ambulatory Visit: Payer: Self-pay

## 2021-09-11 ENCOUNTER — Ambulatory Visit (HOSPITAL_BASED_OUTPATIENT_CLINIC_OR_DEPARTMENT_OTHER): Payer: Medicare Other | Admitting: Nurse Practitioner

## 2021-09-11 VITALS — BP 138/70 | HR 69 | Ht 62.0 in | Wt 146.9 lb

## 2021-09-11 DIAGNOSIS — I25118 Atherosclerotic heart disease of native coronary artery with other forms of angina pectoris: Secondary | ICD-10-CM | POA: Diagnosis not present

## 2021-09-11 DIAGNOSIS — E785 Hyperlipidemia, unspecified: Secondary | ICD-10-CM

## 2021-09-11 DIAGNOSIS — R002 Palpitations: Secondary | ICD-10-CM

## 2021-09-11 MED ORDER — METOPROLOL SUCCINATE ER 25 MG PO TB24: 25.0000 mg | ORAL_TABLET | Freq: Every day | ORAL | 3 refills | Status: DC

## 2021-09-11 NOTE — Patient Instructions (Signed)
Medication Instructions:  Your physician has recommended you make the following change in your medication:   Restart: Metorpolol Succinate 12.5mg  daily at bedtime. If you are tolerating this in 1-2 weeks you may increase your dose to 25mg  daily at bedtime!   *If you need a refill on your cardiac medications before your next appointment, please call your pharmacy*   Lab Work: Your physician recommends that you return for lab work Before May appointment with Dr. June at the [facility] in [place]   If you have labs (blood work) drawn today and your tests are completely normal, you will receive your results only by: MyChart Message (if you have MyChart) OR A paper copy in the mail If you have any lab test that is abnormal or we need to change your treatment, we will call you to review the results.   Testing/Procedures: None ordered today    Follow-Up: At [facility], you and your health needs are our priority.  As part of our continuing mission to provide you with exceptional heart care, we have created designated Provider Care Teams.  These Care Teams include your primary Cardiologist (physician) and Advanced Practice Providers (APPs -  Physician Assistants and Nurse Practitioners) who all work together to provide you with the care you need, when you need it.  We recommend signing up for the patient portal called "MyChart".  Sign up information is provided on this After Visit Summary.  MyChart is used to connect with patients for Virtual Visits (Telemedicine).  Patients are able to view lab/test results, encounter notes, upcoming appointments, etc.  Non-urgent messages can be sent to your provider as well.   To learn more about what you can do with MyChart, go to [facility].    Your next appointment:   As scheduled with Dr. [email or web address] and Dr. Rennis Golden!

## 2021-11-13 NOTE — Progress Notes (Signed)
?Cardiology Office Note:   ? ?Date:  11/19/2021  ? ?ID:  Sarah Pacheco, DOB [birth year], MRN [id number] ? ?PCP:  Patient, No Pcp Per (Inactive) ?  ?[facility] Providers ?Cardiologist:  Destony Prevost Martinique, MD    ? ?Referring MD: No ref. provider found  ? ?Chief Complaint:  ? ?History of Present Illness:   ? ?Sarah Pacheco is a 76 y.o. female with a hx of MI, CAD, hyperlipidemia, GERD, anemia, anxiety, depression, and osteoarthritis.  ? ?Prior inferior STEMI complicated by VF arrest (during cath) and cardiogenic shock 12/20 requiring DES x 2. Echo revealed nl LVEF without rwma. In October 2021, she began experiencing intermittent substernal chest discomfort notable when lying down and with activity.  Symptoms improved with sitting up and SL nitroglycerin.  ED on 06/01/2020 normal troponins x2, EKG showed subtle inferior lateral ST depression.  She was pain-free and was subsequently discharged. Unfortunately she continued to have intermittent chest heaviness associated with occasional diaphoresis and radiation to the shoulders and down both arms, symptoms reminiscent of angina prior to MI. Admitted and underwent LHC which revealed severe in-stent restenosis of the RCA treated with scoring balloon PTCA, LVEF 55 to 65%.  Due to in-stent restenosis she was referred to lipid clinic and started on PCSK9 inhibitor.   ? ?She presented again to the ED on 08/27/20 with worsening chest pain described as a burning pain that radiates down both arms, similar to pain prior to MI, but not as severe.  Her pain was relieved with 3 sublingual nitroglycerin. LHC revealed severe 2 site recurrent in-stent restenosis of the extensive proximal to distal RCA stents proximal to mid 95% ISR, distal stent 75% ISR.  She had successful scoring balloon and angioplasty for 2 additional overlapping stents covering the 2 old stents. No change in left coronary arteries, normal LV function and EDP. Lifelong DAPT was recommended.  ? ?She was last seen on  07/31/21 by Nicholes Rough, PA for evaluation of chest pain radiating down both arms once a month requiring nitro.  Also having less severe episodes of chest tightness across chest that resolved with deep breathing. Dyspnea especially walking up her stairs.  Periods of hypotension with SBP in the 90s, fatigue and positional dizziness.  Occasional palpitations in the morning; these resolve after resting for a few minutes. Also described heart skips a beat. ZIO monitor reevealed NSR, rare PACs and PVCs (<1%), infrequent short runs of PAT, no a fib, intermittent bundle branch block.  Imdur 15 mg at bedtime was added without benefit and was stopped. She was placed on metoprolol up to 25 mg daily. Lab work revealed stable electrolytes, labile blood counts. Creatinine was slightly elevated at 1.24.  ? ?On follow up today she still notes intermittent racing usually when in bed at night. Lasts seconds. HR is slower on metoprolol. No dizziness. Does have intermittent mid sternal chest pain. Not related to exertion. Enjoys gardening and walks regularly. Does note she gets breathy going up hills.  ? ? ?Past Medical History:  ?Diagnosis Date  ? Anxiety   ? Arthritis   ? CAD (coronary artery disease)   ? a. 07/2019 Inf STEMI/VF Arrest/CGS/PCI: LM nl, LADmin irregs, LCX nl, RCA 100p/m (2.75x38 & 2.75x26 Resolute Onyx DESs).  ? Cataract   ? Chest pain   ? Depression   ? GERD (gastroesophageal reflux disease)   ? History of echocardiogram   ? a. 07/2019 Echo: EF 60-65%, no rwma, nl RV size/fxn, mildly dil LA, mild MR.  ?  Hyperlipidemia with target LDL less than 70 10/25/2015  ? ? ?Past Surgical History:  ?Procedure Laterality Date  ? APPENDECTOMY    ? CATARACT EXTRACTION, BILATERAL    ? COLONOSCOPY  2017  ? X2     colon polyps  ? CORONARY BALLOON ANGIOPLASTY N/A 06/04/2020  ? Procedure: CORONARY BALLOON ANGIOPLASTY;  Surgeon: Jettie Booze, MD;  Location: [place] [facility];  Service: Cardiovascular;  Laterality: N/A;  ?  CORONARY STENT INTERVENTION N/A 08/28/2020  ? Procedure: CORONARY STENT INTERVENTION;  Surgeon: Leonie Man, MD;  Location: [place] [facility];  Service: Cardiovascular;  Laterality: N/A;  ? CORONARY/GRAFT ACUTE MI REVASCULARIZATION N/A 07/11/2019  ? Procedure: CORONARY/GRAFT ACUTE MI REVASCULARIZATION;  Surgeon: Martinique, Philomina Leon M, MD;  Location: [place] [facility];  Service: Cardiovascular;  Laterality: N/A;  ? INTRAVASCULAR ULTRASOUND/IVUS N/A 06/04/2020  ? Procedure: Intravascular Ultrasound/IVUS;  Surgeon: Jettie Booze, MD;  Location: [place] [facility];  Service: Cardiovascular;  Laterality: N/A;  ? LAPAROTOMY N/A 10/10/2018  ? Procedure: EXPLORATORY LAPAROTOMY RIGHT COLECTOMY;  Surgeon: Rolm Bookbinder, MD;  Location: [facility];  Service: General;  Laterality: N/A;  ? LEFT HEART CATH AND CORONARY ANGIOGRAPHY N/A 07/11/2019  ? Procedure: LEFT HEART CATH AND CORONARY ANGIOGRAPHY;  Surgeon: Martinique, Farrie Sann M, MD;  Location: [place] [facility];  Service: Cardiovascular;  Laterality: N/A;  ? LEFT HEART CATH AND CORONARY ANGIOGRAPHY N/A 06/04/2020  ? Procedure: LEFT HEART CATH AND CORONARY ANGIOGRAPHY;  Surgeon: Jettie Booze, MD;  Location: [place] [facility];  Service: Cardiovascular;  Laterality: N/A;  ? LEFT HEART CATH AND CORONARY ANGIOGRAPHY N/A 08/28/2020  ? Procedure: LEFT HEART CATH AND CORONARY ANGIOGRAPHY;  Surgeon: Leonie Man, MD;  Location: [place] [facility];  Service: Cardiovascular;  Laterality: N/A;  ? TEMPORARY PACEMAKER N/A 07/11/2019  ? Procedure: TEMPORARY PACEMAKER;  Surgeon: Martinique, Horton Ellithorpe M, MD;  Location: [place] [facility];  Service: Cardiovascular;  Laterality: N/A;  ? VAGINAL HYSTERECTOMY  1973  ? AUB, fibroids  ? ? ?Current Medications: ?Allergies as of 11/19/2021   ? ?   Reactions  ? Atorvastatin Other (See Comments)  ? Cramps in legs  ? Codeine Nausea And Vomiting  ? Erythromycin Base Nausea Only  ? Penicillin G Hives  ? Did it involve swelling of the face/tongue/throat,  SOB, or low BP? No ?Did it involve sudden or severe rash/hives, skin peeling, or any reaction on the inside of your mouth or nose? Yes ?Did you need to seek medical attention at a hospital or doctor's office? Yes ?When did it last happen?  2010 ?If all above answers are "NO", may proceed with cephalosporin use.  ? Rosuvastatin Other (See Comments)  ? Muscle pain  ? ?  ? ?  ?Medication List  ?  ? ?  ? Accurate as of November 19, 2021  1:52 PM. If you have any questions, ask your nurse or doctor.  ?  ?  ? ?  ? ?STOP taking these medications   ? ?isosorbide mononitrate 30 MG 24 hr tablet ?Commonly known as: IMDUR ?Stopped by: Sruthi Maurer Martinique, MD ?  ? ?  ? ?TAKE these medications   ? ?acetaminophen 500 MG tablet ?Commonly known as: TYLENOL ?Take 1,000 mg by mouth every 6 (six) hours as needed for mild pain or headache. ?  ?aspirin 81 MG EC tablet ?Take 1 tablet (81 mg total) by mouth daily. ?  ?clopidogrel 75 MG tablet ?Commonly known as: PLAVIX ?Take 1 tablet (  75 mg total) by mouth daily. ?  ?EQL Omega 3 Fish Oil 1200 MG Caps ?Take 1,200 mg by mouth daily. ?  ?escitalopram 10 MG tablet ?Commonly known as: LEXAPRO ?TAKE 1 TABLET(10 MG) BY MOUTH DAILY ?  ?metoprolol succinate 25 MG 24 hr tablet ?Commonly known as: TOPROL-XL ?Take 1 tablet (25 mg total) by mouth at bedtime. Metorpolol Succinate 12.5mg  daily at bedtime. If you are tolerating this in 1-2 weeks you may increase your dose to 25mg  daily at bedtime! ?  ?nitroGLYCERIN 0.4 MG SL tablet ?Commonly known as: NITROSTAT ?Place 1 tablet (0.4 mg total) under the tongue every 5 (five) minutes as needed for chest pain (up to 3 doses. If taking 3rd dose call 911). ?  ?pantoprazole 40 MG tablet ?Commonly known as: PROTONIX ?Take 1 tablet (40 mg total) by mouth daily. ?  ?Repatha SureClick [id number] MG/ML Soaj ?Generic drug: Evolocumab ?INJECT 1 DOSE UNDER THE SKIN EVERY 14 DAYS ?  ?simvastatin 40 MG tablet ?Commonly known as: ZOCOR ?Take 1 tablet (40 mg total) by mouth daily. ?   ?Vitamin B-12 5000 MCG Tbdp ?Take 5,000 mcg by mouth daily. ?  ? ?  ? ? ?Allergies:   Atorvastatin, Codeine, Erythromycin base, Penicillin g, and Rosuvastatin  ? ?Social History  ? ?Socioeconomic History  ? Marital s

## 2021-11-19 ENCOUNTER — Ambulatory Visit: Payer: Medicare Other | Admitting: Cardiology

## 2021-11-19 ENCOUNTER — Encounter: Payer: Self-pay | Admitting: Cardiology

## 2021-11-19 VITALS — BP 130/70 | HR 56 | Ht 62.0 in | Wt 148.8 lb

## 2021-11-19 DIAGNOSIS — R002 Palpitations: Secondary | ICD-10-CM

## 2021-11-19 DIAGNOSIS — I471 Supraventricular tachycardia: Secondary | ICD-10-CM

## 2021-11-19 DIAGNOSIS — I25118 Atherosclerotic heart disease of native coronary artery with other forms of angina pectoris: Secondary | ICD-10-CM

## 2021-11-19 DIAGNOSIS — E785 Hyperlipidemia, unspecified: Secondary | ICD-10-CM

## 2021-11-19 MED ORDER — NITROGLYCERIN 0.4 MG SL SUBL: 0.4000 mg | SUBLINGUAL_TABLET | SUBLINGUAL | 3 refills | Status: DC | PRN

## 2021-12-01 ENCOUNTER — Encounter (HOSPITAL_BASED_OUTPATIENT_CLINIC_OR_DEPARTMENT_OTHER): Payer: Self-pay

## 2021-12-03 LAB — LIPID PANEL
Chol/HDL Ratio: 2.1 ratio (ref 0.0–4.4)
Cholesterol, Total: 123 mg/dL (ref 100–199)
HDL: 59 mg/dL (ref >39)
LDL Chol Calc (NIH): 41 mg/dL (ref 0–99)
Triglycerides: 131 mg/dL (ref 0–149)
VLDL Cholesterol Cal: 23 mg/dL (ref 5–40)

## 2021-12-09 ENCOUNTER — Encounter: Payer: Self-pay | Admitting: Internal Medicine

## 2021-12-09 ENCOUNTER — Ambulatory Visit: Payer: Medicare Other | Admitting: Internal Medicine

## 2021-12-09 VITALS — BP 108/60 | HR 66 | Ht 63.0 in | Wt 147.8 lb

## 2021-12-09 DIAGNOSIS — E785 Hyperlipidemia, unspecified: Secondary | ICD-10-CM

## 2021-12-09 DIAGNOSIS — I2510 Atherosclerotic heart disease of native coronary artery without angina pectoris: Secondary | ICD-10-CM

## 2021-12-09 NOTE — Progress Notes (Signed)
? ? ?LIPID CLINIC CONSULT NOTE ? ?Chief Complaint:  ?Follow-up dyslipidemia ? ?Primary Care Physician: ?Patient, No Pcp Per (Inactive) ? ?Primary Cardiologist:  ?Peter Swaziland, MD ? ?HPI:  ?Sarah Pacheco is a 76 y.o. female who is being seen today for the evaluation of dyslipidemia at the request of No ref. provider found. This is a 76 year old female with a history of dyslipidemia (LDL greater than 200), anxiety, depression and coronary disease.  She presented in early December with substernal chest pain and was found to have inferior ST elevation MI complicated by cardiogenic shock.  She required 2 stents to the proximal RCA however LVEF was normal.  Prior to this her LDL cholesterol had been in the 120s on ezetimibe and simvastatin, however just prior to the procedure LDL was at the low 80s and then 64 the day after, likely owing to the effects of acute MI.  Based on this her Zetia and simvastatin were discontinued and she was switched to rosuvastatin 2 mg daily.  After taking this for several weeks she had significant myalgias and discontinued the medication.  She was seen in follow-up by Azalee Course on December 11 who referred her to the lipid clinic for further evaluation. ? ?11/14/2019 ? ?Sarah Pacheco returns today for follow-up.  I saw her virtually at her last office visit.  As outlined above she had coronary artery disease with cardiogenic shock and inferior ST elevation MI.  Her lipids were above target.  We discussed the combination simvastatin and ezetimibe (Vytorin) and she was successfully placed on that.  Her lipids as of December showed marked improvement with an LDL 64, however more recently her LDLs trended back up to 85.  Total cholesterol was 162 and triglycerides were 115.  She notes a little less activity although recently has started to become more active.  She is asymptomatic with that.  She says that her diet tends to be pretty good except for sweets and baked goods which may contain butter  and other sources of saturated fat. ? ?06/12/2020 ? ?Sarah Pacheco seen today for follow-up of dyslipidemia.  Unfortunately the other week she developed symptoms concerning for angina.  I discussed her case with Dr. Swaziland who recommended her to come in for cardiac catheterization.  This demonstrated a 95% mid RCA stenosis and a proximal to mid 99% stenosis of a previously treated lesion.  She underwent scoring balloon angioplasty with IVUS guidance.  This improved her symptoms significantly.  She did have a repeat lipid profile recently which showed total cholesterol 175, triglycerides 130, HDL 55 and LDL 97.  This is on ezetimibe and simvastatin 40 mg daily.  We discussed that she remains above a target LDL less than 70 and could drive further risk reduction with further improvement. ? ?12/09/2020 ? ?Sarah Pacheco is seen today in follow-up.  Overall she is doing well on Repatha.  Her recent lipids showed total cholesterol 117, triglycerides 77, HDL 61 and LDL 41.  She denies any side effects with this.  She says is well-tolerated and affordable for her. ? ?12/09/2021 ? ?Sarah Pacheco returns today for follow-up.  Its been a year since we last saw her.  She has done well on Repatha in addition to simvastatin.  Cholesterol remains low with LDL at 41.  She is tolerating the medicine well.  She recently had seen her primary cardiologist for issues with some palpitations and bilateral dorsal hand pain.  She said the symptoms were similar to her symptoms she  had prior to her coronary disease but this was not felt to be due to ischemia. ? ?PMHx:  ?Past Medical History:  ?Diagnosis Date  ? Anxiety   ? Arthritis   ? CAD (coronary artery disease)   ? a. 07/2019 Inf STEMI/VF Arrest/CGS/PCI: LM nl, LADmin irregs, LCX nl, RCA 100p/m (2.75x38 & 2.75x26 Resolute Onyx DESs).  ? Cataract   ? Chest pain   ? Depression   ? GERD (gastroesophageal reflux disease)   ? History of echocardiogram   ? a. 07/2019 Echo: EF 60-65%, no rwma, nl RV  size/fxn, mildly dil LA, mild MR.  ? Hyperlipidemia with target LDL less than 70 10/25/2015  ? ? ?Past Surgical History:  ?Procedure Laterality Date  ? APPENDECTOMY    ? CATARACT EXTRACTION, BILATERAL    ? COLONOSCOPY  2017  ? X2     colon polyps  ? CORONARY BALLOON ANGIOPLASTY N/A 06/04/2020  ? Procedure: CORONARY BALLOON ANGIOPLASTY;  Surgeon: Corky Crafts, MD;  Location: [facility];  Service: Cardiovascular;  Laterality: N/A;  ? CORONARY STENT INTERVENTION N/A 08/28/2020  ? Procedure: CORONARY STENT INTERVENTION;  Surgeon: Marykay Lex, MD;  Location: [facility];  Service: Cardiovascular;  Laterality: N/A;  ? CORONARY/GRAFT ACUTE MI REVASCULARIZATION N/A 07/11/2019  ? Procedure: CORONARY/GRAFT ACUTE MI REVASCULARIZATION;  Surgeon: Swaziland, Peter M, MD;  Location: [facility];  Service: Cardiovascular;  Laterality: N/A;  ? INTRAVASCULAR ULTRASOUND/IVUS N/A 06/04/2020  ? Procedure: Intravascular Ultrasound/IVUS;  Surgeon: Corky Crafts, MD;  Location: [facility];  Service: Cardiovascular;  Laterality: N/A;  ? LAPAROTOMY N/A 10/10/2018  ? Procedure: EXPLORATORY LAPAROTOMY RIGHT COLECTOMY;  Surgeon: Emelia Loron, MD;  Location: [facility];  Service: General;  Laterality: N/A;  ? LEFT HEART CATH AND CORONARY ANGIOGRAPHY N/A 07/11/2019  ? Procedure: LEFT HEART CATH AND CORONARY ANGIOGRAPHY;  Surgeon: Swaziland, Peter M, MD;  Location: [facility];  Service: Cardiovascular;  Laterality: N/A;  ? LEFT HEART CATH AND CORONARY ANGIOGRAPHY N/A 06/04/2020  ? Procedure: LEFT HEART CATH AND CORONARY ANGIOGRAPHY;  Surgeon: Corky Crafts, MD;  Location: [facility];  Service: Cardiovascular;  Laterality: N/A;  ? LEFT HEART CATH AND CORONARY ANGIOGRAPHY N/A 08/28/2020  ? Procedure: LEFT HEART CATH AND CORONARY ANGIOGRAPHY;  Surgeon: Marykay Lex, MD;  Location: [facility];  Service: Cardiovascular;  Laterality: N/A;  ? TEMPORARY PACEMAKER N/A 07/11/2019  ?  Procedure: TEMPORARY PACEMAKER;  Surgeon: Swaziland, Peter M, MD;  Location: [facility];  Service: Cardiovascular;  Laterality: N/A;  ? VAGINAL HYSTERECTOMY  1973  ? AUB, fibroids  ? ? ?FAMHx:  ?Family History  ?Problem Relation Age of Onset  ? Heart attack Father 49  ?     MI  ? Heart disease Father   ? Heart disease Sister   ? Heart attack Brother 57  ?     MI  ? Heart attack Sister   ? Heart disease Sister   ? Colon cancer Sister 55  ? Colon polyps Sister   ? Heart disease Brother   ? Heart failure Brother   ? Colon cancer Brother 17  ? Colon polyps Brother   ? COPD Brother   ? Lung cancer Brother   ? Esophageal cancer Neg Hx   ? Rectal cancer Neg Hx   ? Stomach cancer Neg Hx   ? ? ?SOCHx:  ? reports that she has quit smoking. Her smoking use included  cigarettes. She has a 7.50 pack-year smoking history. She has never used smokeless tobacco. She reports that she does not drink alcohol and does not use drugs. ? ?ALLERGIES:  ?Allergies  ?Allergen Reactions  ? Atorvastatin Other (See Comments)  ?  Cramps in legs  ? Codeine Nausea And Vomiting  ? Erythromycin Base Nausea Only  ? Penicillin G Hives  ?  Did it involve swelling of the face/tongue/throat, SOB, or low BP? No ?Did it involve sudden or severe rash/hives, skin peeling, or any reaction on the inside of your mouth or nose? Yes ?Did you need to seek medical attention at a hospital or doctor's office? Yes ?When did it last happen?  2010 ?If all above answers are "NO", may proceed with cephalosporin use. ?  ? Rosuvastatin Other (See Comments)  ?  Muscle pain  ? ? ?ROS: ?Pertinent items noted in HPI and remainder of comprehensive ROS otherwise negative. ? ?HOME MEDS: ?Current Outpatient Medications on File Prior to Visit  ?Medication Sig Dispense Refill  ? acetaminophen (TYLENOL) 500 MG tablet Take 1,000 mg by mouth every 6 (six) hours as needed for mild pain or headache.     ? aspirin EC 81 MG EC tablet Take 1 tablet (81 mg total) by mouth daily. 90 tablet 3   ? clopidogrel (PLAVIX) 75 MG tablet Take 1 tablet (75 mg total) by mouth daily. 90 tablet 3  ? Cyanocobalamin (VITAMIN B-12) 5000 MCG TBDP Take 5,000 mcg by mouth daily.     ? escitalopram (LEXAPRO) 10 MG

## 2021-12-09 NOTE — Patient Instructions (Signed)
Medication Instructions:  ?Your physician recommends that you continue on your current medications as directed. Please refer to the Current Medication list given to you today. ? ?*If you need a refill on your cardiac medications before your next appointment, please call your pharmacy* ? ? ?Lab Work: ?FASTING lab work to check cholesterol in about 1 year -- before next appointment  ? ?If you have labs (blood work) drawn today and your tests are completely normal, you will receive your results only by: ?MyChart Message (if you have MyChart) OR ?A paper copy in the mail ?If you have any lab test that is abnormal or we need to change your treatment, we will call you to review the results. ? ? ?Follow-Up: ?At [facility], you and your health needs are our priority.  As part of our continuing mission to provide you with exceptional heart care, we have created designated Provider Care Teams.  These Care Teams include your primary Cardiologist (physician) and Advanced Practice Providers (APPs -  Physician Assistants and Nurse Practitioners) who all work together to provide you with the care you need, when you need it. ? ?We recommend signing up for the patient portal called "MyChart".  Sign up information is provided on this After Visit Summary.  MyChart is used to connect with patients for Virtual Visits (Telemedicine).  Patients are able to view lab/test results, encounter notes, upcoming appointments, etc.  Non-urgent messages can be sent to your provider as well.   ?To learn more about what you can do with MyChart, go to [email or web address].   ? ?Your next appointment:   ?12 month(s) ? ?The format for your next appointment:   ?In Person ? ?Provider:   ?Dr. Zoila Shutter ?

## 2021-12-17 ENCOUNTER — Other Ambulatory Visit: Payer: Self-pay | Admitting: Cardiology

## 2022-04-22 DIAGNOSIS — N390 Urinary tract infection, site not specified: Secondary | ICD-10-CM | POA: Diagnosis not present

## 2022-05-09 NOTE — Progress Notes (Unsigned)
Cardiology Office Note:    Date:  05/09/2022   ID:  Sarah Pacheco, DOB 04-01-1946, MRN [id number]  PCP:  Patient, No Pcp Per   [facility] Cardiologist:  Taesha Goodell [place], MD     Referring MD: No ref. provider found   Chief Complaint:   History of Present Illness:    Sarah Pacheco is a 76 y.o. female with a hx of MI, CAD, hyperlipidemia, GERD, anemia, anxiety, depression, and osteoarthritis.   Prior inferior STEMI complicated by VF arrest (during cath) and cardiogenic shock 12/20 requiring DES x 2. Echo revealed nl LVEF without rwma. In October 2021, she began experiencing intermittent substernal chest discomfort notable when lying down and with activity.  Symptoms improved with sitting up and SL nitroglycerin.  ED on 06/01/2020 normal troponins x2, EKG showed subtle inferior lateral ST depression.  She was pain-free and was subsequently discharged. Unfortunately she continued to have intermittent chest heaviness associated with occasional diaphoresis and radiation to the shoulders and down both arms, symptoms reminiscent of angina prior to MI. Admitted and underwent LHC which revealed severe in-stent restenosis of the RCA treated with scoring balloon PTCA, LVEF 55 to 65%.  Due to in-stent restenosis she was referred to lipid clinic and started on PCSK9 inhibitor.    She presented again to the ED on 08/27/20 with worsening chest pain described as a burning pain that radiates down both arms, similar to pain prior to MI, but not as severe.  Her pain was relieved with 3 sublingual nitroglycerin. LHC revealed severe 2 site recurrent in-stent restenosis of the extensive proximal to distal RCA stents proximal to mid 95% ISR, distal stent 75% ISR.  She had successful scoring balloon and angioplasty for 2 additional overlapping stents covering the 2 old stents. No change in left coronary arteries, normal LV function and EDP. Lifelong DAPT was recommended.   She was last seen on 07/31/21 by  Nicholes Rough, PA for evaluation of chest pain radiating down both arms once a month requiring nitro.  Also having less severe episodes of chest tightness across chest that resolved with deep breathing. Dyspnea especially walking up her stairs.  Periods of hypotension with SBP in the 90s, fatigue and positional dizziness.  Occasional palpitations in the morning; these resolve after resting for a few minutes. Also described heart skips a beat. ZIO monitor reevealed NSR, rare PACs and PVCs (<1%), infrequent short runs of PAT, no a fib, intermittent bundle branch block.  Imdur 15 mg at bedtime was added without benefit and was stopped. She was placed on metoprolol up to 25 mg daily. Lab work revealed stable electrolytes, labile blood counts. Creatinine was slightly elevated at 1.24.   On follow up today she still notes intermittent racing usually when in bed at night. Lasts seconds. HR is slower on metoprolol. No dizziness. Does have intermittent mid sternal chest pain. Not related to exertion. Enjoys gardening and walks regularly. Does note she gets breathy going up hills.    Past Medical History:  Diagnosis Date   Anxiety    Arthritis    CAD (coronary artery disease)    a. 07/2019 Inf STEMI/VF Arrest/CGS/PCI: LM nl, LADmin irregs, LCX nl, RCA 100p/m (2.75x38 & 2.75x26 Resolute Onyx DESs).   Cataract    Chest pain    Depression    GERD (gastroesophageal reflux disease)    History of echocardiogram    a. 07/2019 Echo: EF 60-65%, no rwma, nl RV size/fxn, mildly dil LA, mild MR.  Hyperlipidemia with target LDL less than 70 10/25/2015    Past Surgical History:  Procedure Laterality Date   APPENDECTOMY     CATARACT EXTRACTION, BILATERAL     COLONOSCOPY  2017   X2     colon polyps   CORONARY BALLOON ANGIOPLASTY N/A 06/04/2020   Procedure: CORONARY BALLOON ANGIOPLASTY;  Surgeon: Corky Crafts, MD;  Location: [facility] INVASIVE CV LAB;  Service: Cardiovascular;  Laterality: N/A;   CORONARY STENT  INTERVENTION N/A 08/28/2020   Procedure: CORONARY STENT INTERVENTION;  Surgeon: Marykay Lex, MD;  Location: [facility] INVASIVE CV LAB;  Service: Cardiovascular;  Laterality: N/A;   CORONARY/GRAFT ACUTE MI REVASCULARIZATION N/A 07/11/2019   Procedure: CORONARY/GRAFT ACUTE MI REVASCULARIZATION;  Surgeon: [place], Deran Barro M, MD;  Location: [facility] INVASIVE CV LAB;  Service: Cardiovascular;  Laterality: N/A;   INTRAVASCULAR ULTRASOUND/IVUS N/A 06/04/2020   Procedure: Intravascular Ultrasound/IVUS;  Surgeon: Corky Crafts, MD;  Location: [facility] INVASIVE CV LAB;  Service: Cardiovascular;  Laterality: N/A;   LAPAROTOMY N/A 10/10/2018   Procedure: EXPLORATORY LAPAROTOMY RIGHT COLECTOMY;  Surgeon: Emelia Loron, MD;  Location: [facility] ORS;  Service: General;  Laterality: N/A;   LEFT HEART CATH AND CORONARY ANGIOGRAPHY N/A 07/11/2019   Procedure: LEFT HEART CATH AND CORONARY ANGIOGRAPHY;  Surgeon: [place], Olon Russ M, MD;  Location: [facility] INVASIVE CV LAB;  Service: Cardiovascular;  Laterality: N/A;   LEFT HEART CATH AND CORONARY ANGIOGRAPHY N/A 06/04/2020   Procedure: LEFT HEART CATH AND CORONARY ANGIOGRAPHY;  Surgeon: Corky Crafts, MD;  Location: [facility] INVASIVE CV LAB;  Service: Cardiovascular;  Laterality: N/A;   LEFT HEART CATH AND CORONARY ANGIOGRAPHY N/A 08/28/2020   Procedure: LEFT HEART CATH AND CORONARY ANGIOGRAPHY;  Surgeon: Marykay Lex, MD;  Location: [facility] INVASIVE CV LAB;  Service: Cardiovascular;  Laterality: N/A;   TEMPORARY PACEMAKER N/A 07/11/2019   Procedure: TEMPORARY PACEMAKER;  Surgeon: [place], Cera Rorke M, MD;  Location: [facility] INVASIVE CV LAB;  Service: Cardiovascular;  Laterality: N/A;   VAGINAL HYSTERECTOMY  1973   AUB, fibroids    Current Medications: Allergies as of 05/11/2022       Reactions   Atorvastatin Other (See Comments)   Cramps in legs   Codeine Nausea And Vomiting   Erythromycin Base Nausea Only   Penicillin G Hives   Did it involve swelling of the face/tongue/throat, SOB, or low BP?  No Did it involve sudden or severe rash/hives, skin peeling, or any reaction on the inside of your mouth or nose? Yes Did you need to seek medical attention at a hospital or doctor's office? Yes When did it last happen?  2010 If all above answers are "NO", may proceed with cephalosporin use.   Rosuvastatin Other (See Comments)   Muscle pain        Medication List        Accurate as of May 09, 2022 10:37 AM. If you have any questions, ask your nurse or doctor.          acetaminophen 500 MG tablet Commonly known as: TYLENOL Take 1,000 mg by mouth every 6 (six) hours as needed for mild pain or headache.   aspirin EC 81 MG tablet Take 1 tablet (81 mg total) by mouth daily.   clopidogrel 75 MG tablet Commonly known as: PLAVIX TAKE 1 TABLET(75 MG) BY MOUTH DAILY   EQL Omega 3 Fish Oil 1200 MG Caps Take 1,200 mg by mouth daily.   escitalopram 10 MG tablet Commonly known as: LEXAPRO TAKE 1 TABLET(10 MG) BY MOUTH  DAILY   metoprolol succinate 25 MG 24 hr tablet Commonly known as: TOPROL-XL Take 1 tablet (25 mg total) by mouth at bedtime. Metorpolol Succinate 12.5mg  daily at bedtime. If you are tolerating this in 1-2 weeks you may increase your dose to 25mg  daily at bedtime!   nitroGLYCERIN 0.4 MG SL tablet Commonly known as: NITROSTAT Place 1 tablet (0.4 mg total) under the tongue every 5 (five) minutes as needed for chest pain (up to 3 doses. If taking 3rd dose call 911).   pantoprazole 40 MG tablet Commonly known as: PROTONIX Take 1 tablet (40 mg total) by mouth daily.   Repatha SureClick 140 MG/ML Soaj Generic drug: Evolocumab INJECT 1 DOSE UNDER THE SKIN EVERY 14 DAYS   simvastatin 40 MG tablet Commonly known as: ZOCOR Take 1 tablet (40 mg total) by mouth daily.   Vitamin B-12 5000 MCG Tbdp Take 5,000 mcg by mouth daily.        Allergies:   Atorvastatin, Codeine, Erythromycin base, Penicillin g, and Rosuvastatin   Social History   Socioeconomic  History   Marital status: Divorced    Spouse name: Not on file   Number of children: Not on file   Years of education: Not on file   Highest education level: Not on file  Occupational History   Not on file  Tobacco Use   Smoking status: Former    Packs/day: 0.50    Years: 15.00    Total pack years: 7.50    Types: Cigarettes   Smokeless tobacco: Never   Tobacco comments:    Quit around age 39  Vaping Use   Vaping Use: Never used  Substance and Sexual Activity   Alcohol use: No   Drug use: No   Sexual activity: Not on file  Other Topics Concern   Not on file  Social History Narrative   Lives locally with husband.   Social Determinants of Health   Financial Resource Strain: Not on file  Food Insecurity: Not on file  Transportation Needs: Not on file  Physical Activity: Not on file  Stress: Not on file  Social Connections: Not on file     Family History: The patient's family history includes COPD in her brother; Colon cancer (age of onset: 29) in her brother and sister; Colon polyps in her brother and sister; Heart attack in her sister; Heart attack (age of onset: 18) in her brother; Heart attack (age of onset: 39) in her father; Heart disease in her brother, father, sister, and sister; Heart failure in her brother; Lung cancer in her brother. There is no history of Esophageal cancer, Rectal cancer, or Stomach cancer.  ROS:   Please see the history of present illness.    +palpitations +dyspnea on exertion All other systems reviewed and are negative.  Labs/Other Studies Reviewed:    The following studies were reviewed today:  LHC 10/21  Prox RCA to Mid RCA lesion is 99% stenosed. Mid RCA lesion is 95% stenosed. Scoring balloon angioplasty was performed using a BALLOON WOLVERINE 3.00X10, followed by a 3.5 [place] balloon, on both lesions. Post intervention, there is a 0% residual stenosis. The left ventricular systolic function is normal. LV end diastolic pressure is  normal. The left ventricular ejection fraction is 55-65% by visual estimate. There is no aortic valve stenosis.   Continue aggressive secondary prevention.  Patient with some residual pain, likely related to some decreased flow in marginal branches.    LHC 1/22  The left ventricular systolic  function is normal. The left ventricular ejection fraction is 50-55% by visual estimate. LV end diastolic pressure is low. -------------------- Proximal RCA stent is 95% stenosed, in-stent restenosis (subsequent) Mid-distal RCA stent is 75% stenosed.-In-stent restenosis (subsequent) Scoring balloon angioplasty was performed on both lesions using a BALLOON WOLVERINE 3.00X15. Noncompliant balloon angioplasty balloon angioplasty was performed using a 3.5 mm by 15 mm Hughson balloon. A drug-eluting stent was successfully placed open from the distal stent edge to the mid stented segment, using a SYNERGY XD 3.50X38. Postdilated to 3.8 mm A second drug-eluting stent was successfully placed overlapping the original stent and the new stent for the proximal segment, using a SYNERGY XD 3.50X28. Postdilated to 3.8 mm Post intervention, there is a 0% residual stenosis throughout the entire stented segment (the entire old stent segment was covered with new stents). -------------------------------------- Dist RCA lesion is 20% stenosed. Otherwise minimal LCA disease.   SUMMARY Severe 2 site recurrent in-stent restenosis of the extensive proximal to distal RCA stents -> proximal-mid 95% ISR, distal stent 75% ISR Successful Scoring Balloon followed by Victoria Balloon Angioplasty lesion preparation for 2 additional overlapping stents placed covering the 2 old stents using Synergy DES 3.5 mm x 38 and 3.5 mm by 28 mm stent postdilated to 3.8 mm. Relatively normal left coronary arteries-no change Normal LV function and EDP.     RECOMMENDATIONS Return to nursing unit for ongoing care.  Anticipate discharge in the morning. [facility] 1  reconsulted Continue aggressive risk modification Lifelong Thienopyridine antiplatelet agent.    Diagnostic Dominance: Right Intervention     Echo 12/20   Left Ventricle: Left ventricular ejection fraction, by visual estimation,  is 60 to 65%. The left ventricle has normal function. The left ventricular  internal cavity size was the left ventricle is normal in size. There is no  left ventricular hypertrophy.  Left ventricular diastolic parameters are indeterminate.  Right Ventricle: The right ventricular size is normal. No increase in  right ventricular wall thickness. Global RV systolic function is has  normal systolic function.  Left Atrium: Left atrial size was mildly dilated.  Right Atrium: Right atrial size was normal in size  Pericardium: There is no evidence of pericardial effusion.  Mitral Valve: The mitral valve is normal in structure. Mild mitral valve  regurgitation.  Tricuspid Valve: The tricuspid valve is normal in structure. Tricuspid  valve regurgitation is not demonstrated.  Aortic Valve: The aortic valve is tricuspid. Aortic valve regurgitation is  not visualized. The aortic valve is structurally normal, with no evidence  of sclerosis or stenosis.  Pulmonic Valve: The pulmonic valve was grossly normal. Pulmonic valve  regurgitation is trivial.  Aorta: The aortic root and ascending aorta are structurally normal, with  no evidence of dilitation.  Venous: The inferior vena cava is normal in size with greater than 50%  respiratory variability, suggesting right atrial pressure of 3 mmHg.  IAS/Shunts: The atrial septum is grossly normal.   Recent Labs: 07/31/2021: BUN 20; Creatinine, Ser 1.24; Hemoglobin 11.5; Platelets 287; Potassium 4.8; Sodium 140; TSH 1.980  Recent Lipid Panel    Component Value Date/Time   CHOL 123 12/02/2021 0924   TRIG 131 12/02/2021 0924   HDL 59 12/02/2021 0924   CHOLHDL 2.1 12/02/2021 0924   CHOLHDL 1.4 08/27/2020 2213   VLDL 6  08/27/2020 2213   LDLCALC 41 12/02/2021 0924     Risk Assessment/Calculations:      Physical Exam:    VS:  There were no vitals taken for  this visit.    Wt Readings from Last 3 Encounters:  12/09/21 147 lb 12.8 oz (67 kg)  11/19/21 148 lb 12.8 oz (67.5 kg)  09/11/21 146 lb 14.4 oz (66.6 kg)     GEN:  Well nourished, well developed in no acute distress HEENT: Normal NECK: No JVD; No carotid bruits CARDIAC: RRR, no murmurs, rubs, gallops RESPIRATORY:  Clear to auscultation without rales, wheezing or rhonchi  ABDOMEN: Soft, non-tender, non-distended MUSCULOSKELETAL:  No edema; No deformity.  SKIN: Warm and dry NEUROLOGIC:  Alert and oriented x 3 PSYCHIATRIC:  Normal affect   EKG:  EKG is not ordered today.   Diagnoses:    No diagnosis found.   Assessment and Plan:     CAD with stable angina: She feels that her symptoms of angina are stable without significant acceleration. Plan to continue DAPT. Continue Toprol.   Palpitations: Monitor with episodes of PAT which was short lived. Based on HR I don't think we can increase metoprolol further. We discussed trying diltiazem instead but she is Ok with her current symptoms and doesn't want to change. She is not having enough arrhythmia to warrant AAD therapy or ablation. She is reassures that while bothersome her risk is low.   Hyperlipidemia LDL goal < 70: LDL 41 12/04/20. Is managed by Dr. Rennis Golden and is due for appointment in May.  Disposition: 6 months with Dr. [place]   Medication Adjustments/Labs and Tests Ordered: Current medicines are reviewed at length with the patient today.  Concerns regarding medicines are outlined above.  No orders of the defined types were placed in this encounter.  No orders of the defined types were placed in this encounter.   There are no Patient Instructions on file for this visit.   Signed, Dixon Luczak [place], MD  05/09/2022 10:37 AM    [facility]

## 2022-05-09 NOTE — H&P (View-Only) (Signed)
Cardiology Office Note:    Date:  05/11/2022   ID:  Sarah Pacheco, DOB 13-Jul-1946, MRN [id number]  PCP:  Patient, No Pcp Per   [facility] Providers Cardiologist:  Paisly Fingerhut [place], MD     Referring MD: No ref. provider found   Chief Complaint:   History of Present Illness:    Sarah Pacheco is a 76 y.o. female with a hx of MI, CAD, hyperlipidemia, GERD, anemia, anxiety, depression, and osteoarthritis.   Prior inferior STEMI complicated by VF arrest (during cath) and cardiogenic shock 12/20 requiring DES x 2. Echo revealed nl LVEF without rwma. In October 2021, she began experiencing intermittent substernal chest discomfort notable when lying down and with activity.  Symptoms improved with sitting up and SL nitroglycerin.  ED on 06/01/2020 normal troponins x2, EKG showed subtle inferior lateral ST depression.  She was pain-free and was subsequently discharged. Unfortunately she continued to have intermittent chest heaviness associated with occasional diaphoresis and radiation to the shoulders and down both arms, symptoms reminiscent of angina prior to MI. Admitted and underwent LHC which revealed severe in-stent restenosis of the RCA treated with scoring balloon PTCA, LVEF 55 to 65%.  Due to in-stent restenosis she was referred to lipid clinic and started on PCSK9 inhibitor.    She presented again to the ED on 08/27/20 with worsening chest pain described as a burning pain that radiates down both arms, similar to pain prior to MI, but not as severe.  Her pain was relieved with 3 sublingual nitroglycerin. LHC revealed severe 2 site recurrent in-stent restenosis of the extensive proximal to distal RCA stents proximal to mid 95% ISR, distal stent 75% ISR.  She had successful scoring balloon and angioplasty for 2 additional overlapping stents covering the 2 old stents. No change in left coronary arteries, normal LV function and EDP. Lifelong DAPT was recommended.   She was last seen on 07/31/21 by  Jari Favre, PA for evaluation of chest pain radiating down both arms once a month requiring nitro.   Occasional palpitations in the morning; these resolve after resting for a few minutes. Also described heart skips a beat. ZIO monitor reevealed NSR, rare PACs and PVCs (<1%), infrequent short runs of PAT, no a fib, intermittent bundle branch block.  Imdur 15 mg at bedtime was added without benefit and was stopped. She was placed on metoprolol up to 25 mg daily.   On follow up today she notes symptoms of a heaviness in her chest and feeling she needs to take a deep breath. She notes progressive shortness of breath especially going up an incline or steps and gets exhausted easily when using her arms. She did have Covid in July but this was a fairly minor event. These symptoms are different than angina in the past which was more mid sternal pain radiating down her arms.    Past Medical History:  Diagnosis Date   Anxiety    Arthritis    CAD (coronary artery disease)    a. 07/2019 Inf STEMI/VF Arrest/CGS/PCI: LM nl, LADmin irregs, LCX nl, RCA 100p/m (2.75x38 & 2.75x26 Resolute Onyx DESs).   Cataract    Chest pain    Depression    GERD (gastroesophageal reflux disease)    History of echocardiogram    a. 07/2019 Echo: EF 60-65%, no rwma, nl RV size/fxn, mildly dil LA, mild MR.   Hyperlipidemia with target LDL less than 70 10/25/2015    Past Surgical History:  Procedure Laterality Date  APPENDECTOMY     CATARACT EXTRACTION, BILATERAL     COLONOSCOPY  2017   X2     colon polyps   CORONARY BALLOON ANGIOPLASTY N/A 06/04/2020   Procedure: CORONARY BALLOON ANGIOPLASTY;  Surgeon: Corky Crafts, MD;  Location: [facility] INVASIVE CV LAB;  Service: Cardiovascular;  Laterality: N/A;   CORONARY STENT INTERVENTION N/A 08/28/2020   Procedure: CORONARY STENT INTERVENTION;  Surgeon: Marykay Lex, MD;  Location: [facility] INVASIVE CV LAB;  Service: Cardiovascular;  Laterality: N/A;   CORONARY/GRAFT ACUTE MI  REVASCULARIZATION N/A 07/11/2019   Procedure: CORONARY/GRAFT ACUTE MI REVASCULARIZATION;  Surgeon: [place], Caulder Wehner M, MD;  Location: [facility] INVASIVE CV LAB;  Service: Cardiovascular;  Laterality: N/A;   INTRAVASCULAR ULTRASOUND/IVUS N/A 06/04/2020   Procedure: Intravascular Ultrasound/IVUS;  Surgeon: Corky Crafts, MD;  Location: [facility] INVASIVE CV LAB;  Service: Cardiovascular;  Laterality: N/A;   LAPAROTOMY N/A 10/10/2018   Procedure: EXPLORATORY LAPAROTOMY RIGHT COLECTOMY;  Surgeon: Emelia Loron, MD;  Location: [facility] ORS;  Service: General;  Laterality: N/A;   LEFT HEART CATH AND CORONARY ANGIOGRAPHY N/A 07/11/2019   Procedure: LEFT HEART CATH AND CORONARY ANGIOGRAPHY;  Surgeon: [place], Chevie Birkhead M, MD;  Location: [facility] INVASIVE CV LAB;  Service: Cardiovascular;  Laterality: N/A;   LEFT HEART CATH AND CORONARY ANGIOGRAPHY N/A 06/04/2020   Procedure: LEFT HEART CATH AND CORONARY ANGIOGRAPHY;  Surgeon: Corky Crafts, MD;  Location: [facility] INVASIVE CV LAB;  Service: Cardiovascular;  Laterality: N/A;   LEFT HEART CATH AND CORONARY ANGIOGRAPHY N/A 08/28/2020   Procedure: LEFT HEART CATH AND CORONARY ANGIOGRAPHY;  Surgeon: Marykay Lex, MD;  Location: [facility] INVASIVE CV LAB;  Service: Cardiovascular;  Laterality: N/A;   TEMPORARY PACEMAKER N/A 07/11/2019   Procedure: TEMPORARY PACEMAKER;  Surgeon: [place], Phuong Moffatt M, MD;  Location: [facility] INVASIVE CV LAB;  Service: Cardiovascular;  Laterality: N/A;   VAGINAL HYSTERECTOMY  1973   AUB, fibroids    Current Medications: Allergies as of 05/11/2022       Reactions   Atorvastatin Other (See Comments)   Cramps in legs   Codeine Nausea And Vomiting   Erythromycin Base Nausea Only   Penicillin G Hives   Did it involve swelling of the face/tongue/throat, SOB, or low BP? No Did it involve sudden or severe rash/hives, skin peeling, or any reaction on the inside of your mouth or nose? Yes Did you need to seek medical attention at a hospital or doctor's office? Yes When  did it last happen?  2010 If all above answers are "NO", may proceed with cephalosporin use.   Rosuvastatin Other (See Comments)   Muscle pain        Medication List        Accurate as of May 11, 2022  1:37 PM. If you have any questions, ask your nurse or doctor.          acetaminophen 500 MG tablet Commonly known as: TYLENOL Take 1,000 mg by mouth every 6 (six) hours as needed for mild pain or headache.   aspirin EC 81 MG tablet Take 1 tablet (81 mg total) by mouth daily.   clopidogrel 75 MG tablet Commonly known as: PLAVIX TAKE 1 TABLET(75 MG) BY MOUTH DAILY   EQL Omega 3 Fish Oil 1200 MG Caps Take 1,200 mg by mouth daily.   escitalopram 10 MG tablet Commonly known as: LEXAPRO TAKE 1 TABLET(10 MG) BY MOUTH DAILY   metoprolol succinate 25 MG 24 hr tablet Commonly known as: TOPROL-XL Take 1 tablet (25 mg  total) by mouth at bedtime. Metorpolol Succinate 12.5mg  daily at bedtime. If you are tolerating this in 1-2 weeks you may increase your dose to 25mg  daily at bedtime!   nitroGLYCERIN 0.4 MG SL tablet Commonly known as: NITROSTAT Place 1 tablet (0.4 mg total) under the tongue every 5 (five) minutes as needed for chest pain (up to 3 doses. If taking 3rd dose call 911).   pantoprazole 40 MG tablet Commonly known as: PROTONIX Take 1 tablet (40 mg total) by mouth daily.   Repatha SureClick 154 MG/ML Soaj Generic drug: Evolocumab INJECT 1 DOSE UNDER THE SKIN EVERY 14 DAYS   simvastatin 40 MG tablet Commonly known as: ZOCOR Take 1 tablet (40 mg total) by mouth daily.   Vitamin B-12 5000 MCG Tbdp Take 5,000 mcg by mouth daily.        Allergies:   Atorvastatin, Codeine, Erythromycin base, Penicillin g, and Rosuvastatin   Social History   Socioeconomic History   Marital status: Divorced    Spouse name: Not on file   Number of children: Not on file   Years of education: Not on file   Highest education level: Not on file  Occupational History   Not on  file  Tobacco Use   Smoking status: Former    Packs/day: 0.50    Years: 15.00    Total pack years: 7.50    Types: Cigarettes   Smokeless tobacco: Never   Tobacco comments:    Quit around age 44  Vaping Use   Vaping Use: Never used  Substance and Sexual Activity   Alcohol use: No   Drug use: No   Sexual activity: Not on file  Other Topics Concern   Not on file  Social History Narrative   Lives locally with husband.   Social Determinants of Health   Financial Resource Strain: Not on file  Food Insecurity: Not on file  Transportation Needs: Not on file  Physical Activity: Not on file  Stress: Not on file  Social Connections: Not on file     Family History: The patient's family history includes COPD in her brother; Colon cancer (age of onset: 18) in her brother and sister; Colon polyps in her brother and sister; Heart attack in her sister; Heart attack (age of onset: 79) in her brother; Heart attack (age of onset: 55) in her father; Heart disease in her brother, father, sister, and sister; Heart failure in her brother; Lung cancer in her brother. There is no history of Esophageal cancer, Rectal cancer, or Stomach cancer.  ROS:   Please see the history of present illness.    +palpitations +dyspnea on exertion All other systems reviewed and are negative.  Labs/Other Studies Reviewed:    The following studies were reviewed today:  LHC 10/21  Prox RCA to Mid RCA lesion is 99% stenosed. Mid RCA lesion is 95% stenosed. Scoring balloon angioplasty was performed using a BALLOON WOLVERINE 3.00X10, followed by a 3.5 [place] balloon, on both lesions. Post intervention, there is a 0% residual stenosis. The left ventricular systolic function is normal. LV end diastolic pressure is normal. The left ventricular ejection fraction is 55-65% by visual estimate. There is no aortic valve stenosis.   Continue aggressive secondary prevention.  Patient with some residual pain, likely related to  some decreased flow in marginal branches.    LHC 1/22  The left ventricular systolic function is normal. The left ventricular ejection fraction is 50-55% by visual estimate. LV end diastolic pressure is low. --------------------  Proximal RCA stent is 95% stenosed, in-stent restenosis (subsequent) Mid-distal RCA stent is 75% stenosed.-In-stent restenosis (subsequent) Scoring balloon angioplasty was performed on both lesions using a BALLOON WOLVERINE 3.00X15. Noncompliant balloon angioplasty balloon angioplasty was performed using a 3.5 mm by 15 mm [place] balloon. A drug-eluting stent was successfully placed open from the distal stent edge to the mid stented segment, using a SYNERGY XD 3.50X38. Postdilated to 3.8 mm A second drug-eluting stent was successfully placed overlapping the original stent and the new stent for the proximal segment, using a SYNERGY XD 3.50X28. Postdilated to 3.8 mm Post intervention, there is a 0% residual stenosis throughout the entire stented segment (the entire old stent segment was covered with new stents). -------------------------------------- Dist RCA lesion is 20% stenosed. Otherwise minimal LCA disease.   SUMMARY Severe 2 site recurrent in-stent restenosis of the extensive proximal to distal RCA stents -> proximal-mid 95% ISR, distal stent 75% ISR Successful Scoring Balloon followed by [place] Balloon Angioplasty lesion preparation for 2 additional overlapping stents placed covering the 2 old stents using Synergy DES 3.5 mm x 38 and 3.5 mm by 28 mm stent postdilated to 3.8 mm. Relatively normal left coronary arteries-no change Normal LV function and EDP.     RECOMMENDATIONS Return to nursing unit for ongoing care.  Anticipate discharge in the morning. [facility] 1 reconsulted Continue aggressive risk modification Lifelong Thienopyridine antiplatelet agent.    Diagnostic Dominance: Right Intervention     Echo 12/20   Left Ventricle: Left ventricular ejection  fraction, by visual estimation,  is 60 to 65%. The left ventricle has normal function. The left ventricular  internal cavity size was the left ventricle is normal in size. There is no  left ventricular hypertrophy.  Left ventricular diastolic parameters are indeterminate.  Right Ventricle: The right ventricular size is normal. No increase in  right ventricular wall thickness. Global RV systolic function is has  normal systolic function.  Left Atrium: Left atrial size was mildly dilated.  Right Atrium: Right atrial size was normal in size  Pericardium: There is no evidence of pericardial effusion.  Mitral Valve: The mitral valve is normal in structure. Mild mitral valve  regurgitation.  Tricuspid Valve: The tricuspid valve is normal in structure. Tricuspid  valve regurgitation is not demonstrated.  Aortic Valve: The aortic valve is tricuspid. Aortic valve regurgitation is  not visualized. The aortic valve is structurally normal, with no evidence  of sclerosis or stenosis.  Pulmonic Valve: The pulmonic valve was grossly normal. Pulmonic valve  regurgitation is trivial.  Aorta: The aortic root and ascending aorta are structurally normal, with  no evidence of dilitation.  Venous: The inferior vena cava is normal in size with greater than 50%  respiratory variability, suggesting right atrial pressure of 3 mmHg.  IAS/Shunts: The atrial septum is grossly normal.   Recent Labs: 07/31/2021: BUN 20; Creatinine, Ser 1.24; Hemoglobin 11.5; Platelets 287; Potassium 4.8; Sodium 140; TSH 1.980  Recent Lipid Panel    Component Value Date/Time   CHOL 123 12/02/2021 0924   TRIG 131 12/02/2021 0924   HDL 59 12/02/2021 0924   CHOLHDL 2.1 12/02/2021 0924   CHOLHDL 1.4 08/27/2020 2213   VLDL 6 08/27/2020 2213   LDLCALC 41 12/02/2021 0924     Risk Assessment/Calculations:      Physical Exam:    VS:  BP 114/64   Pulse (!) 58   Ht 5\' 3"  (1.6 m)   Wt 149 lb 9.6 oz (67.9 kg)   SpO2 97%  BMI  26.50 kg/m     Wt Readings from Last 3 Encounters:  05/11/22 149 lb 9.6 oz (67.9 kg)  12/09/21 147 lb 12.8 oz (67 kg)  11/19/21 148 lb 12.8 oz (67.5 kg)     GEN:  Well nourished, well developed in no acute distress HEENT: Normal NECK: No JVD; No carotid bruits CARDIAC: RRR, no murmurs, rubs, gallops RESPIRATORY:  Clear to auscultation without rales, wheezing or rhonchi  ABDOMEN: Soft, non-tender, non-distended MUSCULOSKELETAL:  No edema; No deformity.  SKIN: Warm and dry NEUROLOGIC:  Alert and oriented x 3 PSYCHIATRIC:  Normal affect   EKG:  EKG is not ordered today.   Diagnoses:    1. Coronary artery disease of native artery of native heart with stable angina pectoris (HCC)   2. Hyperlipidemia LDL goal <70   3. Essential hypertension      Assessment and Plan:     CAD with progressive symptoms of heaviness, SOB and exercise intolerance. Concerning for anginal equivalent symptoms given her history.  Plan to continue DAPT. Continue Toprol. Discussed further ischemic evaluation. After discussion we decided to proceed with cardiac cath for more definitive evaluation. The procedure and risks were reviewed including but not limited to death, myocardial infarction, stroke, arrythmias, bleeding, transfusion, emergency surgery, dye allergy, or renal dysfunction. The patient voices understanding and is agreeable to proceed.   Palpitations: Monitor with brief episodes of PAT which were short lived. Continue low dose Toprol XL  Hyperlipidemia LDL goal < 70: LDL 41. Is managed by Dr. Rennis GoldenHilty. On Repatha.  Disposition: cardiac cath arranged on October 17.    Medication Adjustments/Labs and Tests Ordered: Current medicines are reviewed at length with the patient today.  Concerns regarding medicines are outlined above.  No orders of the defined types were placed in this encounter.  No orders of the defined types were placed in this encounter.   There are no Patient Instructions on  file for this visit.   Signed, Nolan Tuazon [place], MD  05/11/2022 1:37 PM    [facility]

## 2022-05-11 ENCOUNTER — Encounter: Payer: Self-pay | Admitting: Cardiology

## 2022-05-11 ENCOUNTER — Ambulatory Visit: Payer: Medicare Other | Attending: Cardiology | Admitting: Cardiology

## 2022-05-11 VITALS — BP 114/64 | HR 58 | Ht 63.0 in | Wt 149.6 lb

## 2022-05-11 DIAGNOSIS — Z01812 Encounter for preprocedural laboratory examination: Secondary | ICD-10-CM

## 2022-05-11 DIAGNOSIS — E785 Hyperlipidemia, unspecified: Secondary | ICD-10-CM | POA: Diagnosis not present

## 2022-05-11 DIAGNOSIS — I25118 Atherosclerotic heart disease of native coronary artery with other forms of angina pectoris: Secondary | ICD-10-CM

## 2022-05-11 DIAGNOSIS — I10 Essential (primary) hypertension: Secondary | ICD-10-CM | POA: Diagnosis not present

## 2022-05-11 MED ORDER — SODIUM CHLORIDE 0.9% FLUSH: 3.0000 mL | Freq: Two times a day (BID) | INTRAVENOUS | Status: DC

## 2022-05-11 NOTE — Patient Instructions (Signed)
Medication Instructions:  Continue same medications *If you need a refill on your cardiac medications before your next appointment, please call your pharmacy*   Lab Work: Bmet,cbc,pt today   Testing/Procedures: Cardiac cath scheduled Tue 10/17   Follow instructions below    Follow-Up: At [facility], you and your health needs are our priority.  As part of our continuing mission to provide you with exceptional heart care, we have created designated Provider Care Teams.  These Care Teams include your primary Cardiologist (physician) and Advanced Practice Providers (APPs -  Physician Assistants and Nurse Practitioners) who all work together to provide you with the care you need, when you need it.  We recommend signing up for the patient portal called "MyChart".  Sign up information is provided on this After Visit Summary.  MyChart is used to connect with patients for Virtual Visits (Telemedicine).  Patients are able to view lab/test results, encounter notes, upcoming appointments, etc.  Non-urgent messages can be sent to your provider as well.   To learn more about what you can do with MyChart, go to [email or web address].    Your next appointment: After cardiac cath     The format for your next appointment: Office   Provider:  Dr.Jordan           Cardiac/Peripheral Catheterization   You are scheduled for a Cardiac Catheterization on Tuesday, October 17 with Dr. Peter Martinique.  1. Please arrive at the Main Entrance A at [facility]: [place], [place] [address] on October 17 at 5:30 AM (This time is two hours before your procedure to ensure your preparation). Free valet parking service is available. You will check in at ADMITTING. The support person will be asked to wait in the waiting room.  It is OK to have someone drop you off and come back when you are ready to be discharged.        Special note: Every effort is made to have your procedure  done on time. Please understand that emergencies sometimes delay scheduled procedures.   . 2. Diet: Do not eat solid foods after midnight.  You may have clear liquids until 5 AM the day of the procedure.  3. Labs: You will need to have blood drawn on Monday 10/2 at [place] office.You do not need to be fasting.  4. Medication instructions in preparation for your procedure:           On the morning of your procedure, take Aspirin 81 mg and any morning medicines NOT listed above.  You may use sips of water.  5. Plan to go home the same day, you will only stay overnight if medically necessary. 6. You MUST have a responsible adult to drive you home. 7. An adult MUST be with you the first 24 hours after you arrive home. 8. Bring a current list of your medications, and the last time and date medication taken. 9. Bring ID and current insurance cards. 10.Please wear clothes that are easy to get on and off and wear slip-on shoes.  Thank you for allowing [place] to care for you!   -- [facility]     Important Information About Sugar

## 2022-05-12 LAB — CBC WITH DIFFERENTIAL/PLATELET
Basophils Absolute: 0.1 10*3/uL (ref 0.0–0.2)
Basos: 1 %
EOS (ABSOLUTE): 0.1 10*3/uL (ref 0.0–0.4)
Eos: 1 %
Hematocrit: 34.4 % (ref 34.0–46.6)
Hemoglobin: 10.7 g/dL — ABNORMAL LOW (ref 11.1–15.9)
Immature Grans (Abs): 0 10*3/uL (ref 0.0–0.1)
Immature Granulocytes: 0 %
Lymphocytes Absolute: 3 10*3/uL (ref 0.7–3.1)
Lymphs: 43 %
MCH: 26.7 pg (ref 26.6–33.0)
MCHC: 31.1 g/dL — ABNORMAL LOW (ref 31.5–35.7)
MCV: 86 fL (ref 79–97)
Monocytes Absolute: 0.5 10*3/uL (ref 0.1–0.9)
Monocytes: 8 %
Neutrophils Absolute: 3.4 10*3/uL (ref 1.4–7.0)
Neutrophils: 47 %
Platelets: 298 10*3/uL (ref 150–450)
RBC: 4.01 x10E6/uL (ref 3.77–5.28)
RDW: 14.1 % (ref 11.7–15.4)
WBC: 7.2 10*3/uL (ref 3.4–10.8)

## 2022-05-12 LAB — BASIC METABOLIC PANEL
BUN/Creatinine Ratio: 11 — ABNORMAL LOW (ref 12–28)
BUN: 13 mg/dL (ref 8–27)
CO2: 23 mmol/L (ref 20–29)
Calcium: 9.6 mg/dL (ref 8.7–10.3)
Chloride: 107 mmol/L — ABNORMAL HIGH (ref 96–106)
Creatinine, Ser: 1.14 mg/dL — ABNORMAL HIGH (ref 0.57–1.00)
Glucose: 79 mg/dL (ref 70–99)
Potassium: 4.9 mmol/L (ref 3.5–5.2)
Sodium: 143 mmol/L (ref 134–144)
eGFR: 51 mL/min/{1.73_m2} — ABNORMAL LOW (ref >59)

## 2022-05-12 LAB — PT AND PTT
INR: 1 (ref 0.9–1.2)
Prothrombin Time: 11.2 s (ref 9.1–12.0)
aPTT: 25 s (ref 24–33)

## 2022-05-18 ENCOUNTER — Other Ambulatory Visit: Payer: Self-pay | Admitting: Internal Medicine

## 2022-05-18 DIAGNOSIS — I214 Non-ST elevation (NSTEMI) myocardial infarction: Secondary | ICD-10-CM

## 2022-05-18 DIAGNOSIS — I2510 Atherosclerotic heart disease of native coronary artery without angina pectoris: Secondary | ICD-10-CM

## 2022-05-18 DIAGNOSIS — E785 Hyperlipidemia, unspecified: Secondary | ICD-10-CM

## 2022-05-25 ENCOUNTER — Telehealth: Payer: Self-pay | Admitting: *Deleted

## 2022-05-25 NOTE — Telephone Encounter (Signed)
Cardiac Catheterization scheduled at [facility] for: Tuesday May 26, 2022 7:30 AM Arrival time and place: [place] Entrance A at: 5:30 AM  Nothing to eat after midnight prior to procedure, clear liquids until 5 AM day of procedure.  Medication instructions: -Usual morning medications can be taken with sips of water including aspirin 81 mg and Plavix 75 mg  Confirmed patient has responsible adult to drive home post procedure and be with patient first 24 hours after arriving home.  Patient reports no new symptoms concerning for COVID-19 in the past 10 days.   Reviewed procedure instructions with patient.

## 2022-05-26 ENCOUNTER — Encounter (HOSPITAL_COMMUNITY): Payer: Self-pay | Admitting: Cardiology

## 2022-05-26 ENCOUNTER — Encounter (HOSPITAL_COMMUNITY)
Admission: RE | Disposition: A | Discharge status: Home / Self Care | Payer: Self-pay | Source: Home / Self Care | Attending: Cardiology

## 2022-05-26 ENCOUNTER — Ambulatory Visit (HOSPITAL_COMMUNITY)
Admission: RE | Admit: 2022-05-26 | Discharge: 2022-05-26 | Disposition: A | Discharge status: Home / Self Care | Payer: Medicare Other | Attending: Cardiology | Admitting: Cardiology

## 2022-05-26 ENCOUNTER — Telehealth: Payer: Self-pay | Admitting: Cardiology

## 2022-05-26 ENCOUNTER — Other Ambulatory Visit: Payer: Self-pay

## 2022-05-26 ENCOUNTER — Telehealth: Payer: Self-pay

## 2022-05-26 DIAGNOSIS — I252 Old myocardial infarction: Secondary | ICD-10-CM | POA: Insufficient documentation

## 2022-05-26 DIAGNOSIS — E785 Hyperlipidemia, unspecified: Secondary | ICD-10-CM | POA: Diagnosis not present

## 2022-05-26 DIAGNOSIS — K219 Gastro-esophageal reflux disease without esophagitis: Secondary | ICD-10-CM | POA: Diagnosis not present

## 2022-05-26 DIAGNOSIS — Z955 Presence of coronary angioplasty implant and graft: Secondary | ICD-10-CM | POA: Insufficient documentation

## 2022-05-26 DIAGNOSIS — F419 Anxiety disorder, unspecified: Secondary | ICD-10-CM | POA: Insufficient documentation

## 2022-05-26 DIAGNOSIS — D649 Anemia, unspecified: Secondary | ICD-10-CM | POA: Diagnosis not present

## 2022-05-26 DIAGNOSIS — F32A Depression, unspecified: Secondary | ICD-10-CM | POA: Diagnosis not present

## 2022-05-26 DIAGNOSIS — I2584 Coronary atherosclerosis due to calcified coronary lesion: Secondary | ICD-10-CM | POA: Diagnosis not present

## 2022-05-26 DIAGNOSIS — Z87891 Personal history of nicotine dependence: Secondary | ICD-10-CM | POA: Diagnosis not present

## 2022-05-26 DIAGNOSIS — I25118 Atherosclerotic heart disease of native coronary artery with other forms of angina pectoris: Secondary | ICD-10-CM

## 2022-05-26 DIAGNOSIS — I2582 Chronic total occlusion of coronary artery: Secondary | ICD-10-CM | POA: Diagnosis not present

## 2022-05-26 HISTORY — PX: LEFT HEART CATH AND CORONARY ANGIOGRAPHY: CATH118249

## 2022-05-26 SURGERY — LEFT HEART CATH AND CORONARY ANGIOGRAPHY
Anesthesia: LOCAL

## 2022-05-26 MED ORDER — MIDAZOLAM HCL 2 MG/2ML IJ SOLN
INTRAMUSCULAR | Status: DC | PRN
  Administered 2022-05-26: 2 mg via INTRAVENOUS

## 2022-05-26 MED ORDER — SODIUM CHLORIDE 0.9% FLUSH: 3.0000 mL | INTRAVENOUS | Status: DC | PRN

## 2022-05-26 MED ORDER — LIDOCAINE HCL (PF) 1 % IJ SOLN
INTRAMUSCULAR | Status: AC
  Filled 2022-05-26: qty 30

## 2022-05-26 MED ORDER — SODIUM CHLORIDE 0.9 % WEIGHT BASED INFUSION: 1.0000 mL/kg/h | INTRAVENOUS | Status: DC

## 2022-05-26 MED ORDER — HYDRALAZINE HCL 20 MG/ML IJ SOLN: 10.0000 mg | INTRAMUSCULAR | Status: DC | PRN

## 2022-05-26 MED ORDER — HEPARIN SODIUM (PORCINE) 1000 UNIT/ML IJ SOLN
INTRAMUSCULAR | Status: DC | PRN
  Administered 2022-05-26: 3500 [IU] via INTRAVENOUS

## 2022-05-26 MED ORDER — ASPIRIN 81 MG PO CHEW: 81.0000 mg | CHEWABLE_TABLET | ORAL | Status: DC

## 2022-05-26 MED ORDER — FENTANYL CITRATE (PF) 100 MCG/2ML IJ SOLN
INTRAMUSCULAR | Status: DC | PRN
  Administered 2022-05-26: 25 ug via INTRAVENOUS

## 2022-05-26 MED ORDER — SODIUM CHLORIDE 0.9% FLUSH: 3.0000 mL | Freq: Two times a day (BID) | INTRAVENOUS | Status: DC

## 2022-05-26 MED ORDER — IOHEXOL 350 MG/ML SOLN
INTRAVENOUS | Status: DC | PRN
  Administered 2022-05-26: 50 mL

## 2022-05-26 MED ORDER — SODIUM CHLORIDE 0.9 % WEIGHT BASED INFUSION
3.0000 mL/kg/h | INTRAVENOUS | Status: AC
  Administered 2022-05-26: 3 mL/kg/h via INTRAVENOUS

## 2022-05-26 MED ORDER — HEPARIN (PORCINE) IN NACL 1000-0.9 UT/500ML-% IV SOLN
INTRAVENOUS | Status: AC
  Filled 2022-05-26: qty 1000

## 2022-05-26 MED ORDER — ACETAMINOPHEN 325 MG PO TABS: 650.0000 mg | ORAL_TABLET | ORAL | Status: DC | PRN

## 2022-05-26 MED ORDER — HEPARIN SODIUM (PORCINE) 1000 UNIT/ML IJ SOLN
INTRAMUSCULAR | Status: AC
  Filled 2022-05-26: qty 10

## 2022-05-26 MED ORDER — LIDOCAINE HCL (PF) 1 % IJ SOLN
INTRAMUSCULAR | Status: DC | PRN
  Administered 2022-05-26: 2 mL via INTRADERMAL

## 2022-05-26 MED ORDER — VERAPAMIL HCL 2.5 MG/ML IV SOLN
INTRAVENOUS | Status: DC | PRN
  Administered 2022-05-26: 10 mL via INTRA_ARTERIAL

## 2022-05-26 MED ORDER — MIDAZOLAM HCL 2 MG/2ML IJ SOLN
INTRAMUSCULAR | Status: AC
  Filled 2022-05-26: qty 2

## 2022-05-26 MED ORDER — FENTANYL CITRATE (PF) 100 MCG/2ML IJ SOLN
INTRAMUSCULAR | Status: AC
  Filled 2022-05-26: qty 2

## 2022-05-26 MED ORDER — SODIUM CHLORIDE 0.9 % IV SOLN: 250.0000 mL | INTRAVENOUS | Status: DC | PRN

## 2022-05-26 MED ORDER — LABETALOL HCL 5 MG/ML IV SOLN: 10.0000 mg | INTRAVENOUS | Status: DC | PRN

## 2022-05-26 MED ORDER — ONDANSETRON HCL 4 MG/2ML IJ SOLN: 4.0000 mg | Freq: Four times a day (QID) | INTRAMUSCULAR | Status: DC | PRN

## 2022-05-26 MED ORDER — VERAPAMIL HCL 2.5 MG/ML IV SOLN
INTRAVENOUS | Status: AC
  Filled 2022-05-26: qty 2

## 2022-05-26 MED ORDER — HEPARIN (PORCINE) IN NACL 1000-0.9 UT/500ML-% IV SOLN
INTRAVENOUS | Status: DC | PRN
  Administered 2022-05-26 (×2): 500 mL

## 2022-05-26 SURGICAL SUPPLY — 11 items
BAND ZEPHYR COMPRESS 30 LONG (HEMOSTASIS) IMPLANT
CATH 5FR JL3.5 JR4 ANG PIG MP (CATHETERS) IMPLANT
GLIDESHEATH SLEND SS 6F .021 (SHEATH) IMPLANT
GUIDEWIRE INQWIRE 1.5J.035X260 (WIRE) IMPLANT
INQWIRE 1.5J .035X260CM (WIRE) ×1
KIT HEART LEFT (KITS) ×1 IMPLANT
PACK CARDIAC CATHETERIZATION (CUSTOM PROCEDURE TRAY) ×1 IMPLANT
SYR MEDRAD MARK 7 150ML (SYRINGE) ×1 IMPLANT
TRANSDUCER W/STOPCOCK (MISCELLANEOUS) ×1 IMPLANT
TUBING CIL FLEX 10 FLL-RA (TUBING) ×1 IMPLANT
WIRE HI TORQ VERSACORE-J 145CM (WIRE) IMPLANT

## 2022-05-26 NOTE — Interval H&P Note (Signed)
History and Physical Interval Note:  05/26/2022 7:22 AM  Sarah Pacheco  has presented today for surgery, with the diagnosis of chest pain.  The various methods of treatment have been discussed with the patient and family. After consideration of risks, benefits and other options for treatment, the patient has consented to  Procedure(s): LEFT HEART CATH AND CORONARY ANGIOGRAPHY (N/A) as a surgical intervention.  The patient's history has been reviewed, patient examined, no change in status, stable for surgery.  I have reviewed the patient's chart and labs.  Questions were answered to the patient's satisfaction.    Cath Lab Visit (complete for each Cath Lab visit)  Clinical Evaluation Leading to the Procedure:   ACS: No.  Non-ACS:    Anginal Classification: CCS III  Anti-ischemic medical therapy: Minimal Therapy (1 class of medications)  Non-Invasive Test Results: No non-invasive testing performed  Prior CABG: No previous CABG       [facility] 05/26/2022 7:22 AM

## 2022-05-26 NOTE — Telephone Encounter (Signed)
Attempted to call [employer] to find out what medication has been approved. Was left on hold.

## 2022-05-26 NOTE — Telephone Encounter (Signed)
PA for Repatha submitted and approved by [facility], Effective from 05/26/2022 through 05/27/2023.

## 2022-05-26 NOTE — Telephone Encounter (Signed)
Prior Auth for medication has been approved for a year per [employer]. Prior Auth #  [id number]

## 2022-06-03 NOTE — Progress Notes (Signed)
Cardiology Office Note:    Date:  06/17/2022   ID:  Sarah Pacheco, DOB 1945-12-25, MRN [id number]  PCP:  Patient, No Pcp Per   [facility] Cardiologist:  Lashena Signer Martin[place], MD     Referring MD: No ref. provider found   Chief Complaint:   History of Present Illness:    Sarah Pacheco is a 76 y.o. female with a hx of MI, CAD, hyperlipidemia, GERD, anemia, anxiety, depression, and osteoarthritis.   Prior inferior STEMI complicated by VF arrest (during cath) and cardiogenic shock 12/20 requiring DES x 2. Echo revealed nl LVEF without rwma. In October 2021, she began experiencing intermittent substernal chest discomfort notable when lying down and with activity.  Symptoms improved with sitting up and SL nitroglycerin.  ED on 06/01/2020 normal troponins x2, EKG showed subtle inferior lateral ST depression.  She was pain-free and was subsequently discharged. Unfortunately she continued to have intermittent chest heaviness associated with occasional diaphoresis and radiation to the shoulders and down both arms, symptoms reminiscent of angina prior to MI. Admitted and underwent LHC which revealed severe in-stent restenosis of the RCA treated with scoring balloon PTCA, LVEF 55 to 65%.  Due to in-stent restenosis she was referred to lipid clinic and started on PCSK9 inhibitor.    She presented again to the ED on 08/27/20 with worsening chest pain described as a burning pain that radiates down both arms, similar to pain prior to MI, but not as severe.  Her pain was relieved with 3 sublingual nitroglycerin. LHC revealed severe 2 site recurrent in-stent restenosis of the extensive proximal to distal RCA stents proximal to mid 95% ISR, distal stent 75% ISR.  She had successful scoring balloon and angioplasty for 2 additional overlapping stents covering the 2 old stents. No change in left coronary arteries, normal LV function and EDP. Lifelong DAPT was recommended.   She was last seen on 07/31/21 by  Nicholes Rough, PA for evaluation of chest pain radiating down both arms once a month requiring nitro.   Occasional palpitations in the morning; these resolve after resting for a few minutes. Also described heart skips a beat. ZIO monitor reevealed NSR, rare PACs and PVCs (<1%), infrequent short runs of PAT, no a fib, intermittent bundle branch block.  Imdur 15 mg at bedtime was added without benefit and was stopped. She was placed on metoprolol up to 25 mg daily.   When seen before  she noted symptoms of a heaviness in her chest and feeling she needs to take a deep breath. She notes progressive shortness of breath especially going up an incline or steps and gets exhausted easily when using her arms. She did have Covid in July but this was a fairly minor event. These symptoms are different than angina in the past which was more mid sternal pain radiating down her arms. She underwent cardiac cath showing widely patent RCA stents and no significant obstructive disease.  She is seeing a chiropractor now for the hand tingling and falling asleep. This has helped. Is trying to be more active.    Past Medical History:  Diagnosis Date   Anxiety    Arthritis    CAD (coronary artery disease)    a. 07/2019 Inf STEMI/VF Arrest/CGS/PCI: LM nl, LADmin irregs, LCX nl, RCA 100p/m (2.75x38 & 2.75x26 Resolute Onyx DESs).   Cataract    Chest pain    Depression    GERD (gastroesophageal reflux disease)    History of echocardiogram  a. 07/2019 Echo: EF 60-65%, no rwma, nl RV size/fxn, mildly dil LA, mild MR.   Hyperlipidemia with target LDL less than 70 10/25/2015    Past Surgical History:  Procedure Laterality Date   APPENDECTOMY     CATARACT EXTRACTION, BILATERAL     COLONOSCOPY  2017   X2     colon polyps   CORONARY BALLOON ANGIOPLASTY N/A 06/04/2020   Procedure: CORONARY BALLOON ANGIOPLASTY;  Surgeon: Corky CraftsVaranasi, Jayadeep S, MD;  Location: [facility];  Service: Cardiovascular;  Laterality: N/A;    CORONARY STENT INTERVENTION N/A 08/28/2020   Procedure: CORONARY STENT INTERVENTION;  Surgeon: Marykay LexHarding, David W, MD;  [facility];  Service: Cardiovascular;  Laterality: N/A;   CORONARY/GRAFT ACUTE MI REVASCULARIZATION N/A 07/11/2019   Procedure: CORONARY/GRAFT ACUTE MI REVASCULARIZATION;  Surgeon: SwazilandJordan, Merl Guardino M, MD;  [facility];  Service: Cardiovascular;  Laterality: N/A;   INTRAVASCULAR ULTRASOUND/IVUS N/A 06/04/2020   Procedure: Intravascular Ultrasound/IVUS;  Surgeon: Corky CraftsVaranasi, Jayadeep S, MD;  [facility];  Service: Cardiovascular;  Laterality: N/A;   LAPAROTOMY N/A 10/10/2018   Procedure: EXPLORATORY LAPAROTOMY RIGHT COLECTOMY;  Surgeon: Emelia LoronWakefield, Matthew, MD;  Location: [facility];  Service: General;  Laterality: N/A;   LEFT HEART CATH AND CORONARY ANGIOGRAPHY N/A 07/11/2019   Procedure: LEFT HEART CATH AND CORONARY ANGIOGRAPHY;  Surgeon: SwazilandJordan, Rhaya Coale M, MD;  [facility];  Service: Cardiovascular;  Laterality: N/A;   LEFT HEART CATH AND CORONARY ANGIOGRAPHY N/A 06/04/2020   Procedure: LEFT HEART CATH AND CORONARY ANGIOGRAPHY;  Surgeon: Corky CraftsVaranasi, Jayadeep S, MD;  [facility];  Service: Cardiovascular;  Laterality: N/A;   LEFT HEART CATH AND CORONARY ANGIOGRAPHY N/A 08/28/2020   Procedure: LEFT HEART CATH AND CORONARY ANGIOGRAPHY;  Surgeon: Marykay LexHarding, David W, MD;  [facility];  Service: Cardiovascular;  Laterality: N/A;   LEFT HEART CATH AND CORONARY ANGIOGRAPHY N/A 05/26/2022   Procedure: LEFT HEART CATH AND CORONARY ANGIOGRAPHY;  Surgeon: SwazilandJordan, Timmy Bubeck M, MD;  [facility];  Service: Cardiovascular;  Laterality: N/A;   TEMPORARY PACEMAKER N/A 07/11/2019   Procedure: TEMPORARY PACEMAKER;  Surgeon: SwazilandJordan, Marqueta Pulley M, MD;  [facility];  Service: Cardiovascular;  Laterality: N/A;   VAGINAL HYSTERECTOMY  1973   AUB, fibroids    Current Medications: Allergies as of  06/17/2022       Reactions   Atorvastatin Other (See Comments)   Cramps in legs   Codeine Nausea And Vomiting   Erythromycin Base Nausea Only   Penicillin G Hives   Did it involve swelling of the face/tongue/throat, SOB, or low BP? No Did it involve sudden or severe rash/hives, skin peeling, or any reaction on the inside of your mouth or nose? Yes Did you need to seek medical attention at a hospital or doctor's office? Yes When did it last happen?  2010 If all above answers are "NO", may proceed with cephalosporin use.   Rosuvastatin Other (See Comments)   Muscle pain        Medication List        Accurate as of June 17, 2022 10:42 AM. If you have any questions, ask your nurse or doctor.          acetaminophen 500 MG tablet Commonly known as: TYLENOL Take 1,000 mg by mouth every 6 (six) hours as needed for mild pain or headache.   aspirin EC 81 MG tablet Take 1 tablet (81  mg total) by mouth daily.   cholecalciferol 25 MCG (1000 UNIT) tablet Commonly known as: VITAMIN D3 Take 1,000 Units by mouth daily.   clopidogrel 75 MG tablet Commonly known as: PLAVIX TAKE 1 TABLET(75 MG) BY MOUTH DAILY   escitalopram 10 MG tablet Commonly known as: LEXAPRO TAKE 1 TABLET(10 MG) BY MOUTH DAILY   metoprolol succinate 25 MG 24 hr tablet Commonly known as: TOPROL-XL Take 25 mg by mouth at bedtime.   nitroGLYCERIN 0.4 MG SL tablet Commonly known as: NITROSTAT Place 1 tablet (0.4 mg total) under the tongue every 5 (five) minutes as needed for chest pain (up to 3 doses. If taking 3rd dose call 911).   pantoprazole 40 MG tablet Commonly known as: PROTONIX Take 1 tablet (40 mg total) by mouth daily.   Repatha SureClick 140 MG/ML Soaj Generic drug: Evolocumab INJECT 1 DOSE UNDER THE SKIN ONCE EVERY 14 DAYS   simvastatin 40 MG tablet Commonly known as: ZOCOR TAKE 1 TABLET(40 MG) BY MOUTH DAILY   Vitamin B-12 5000 MCG Tbdp Take 5,000 mcg by mouth daily.         Allergies:   Atorvastatin, Codeine, Erythromycin base, Penicillin g, and Rosuvastatin   Social History   Socioeconomic History   Marital status: Divorced    Spouse name: Not on file   Number of children: Not on file   Years of education: Not on file   Highest education level: Not on file  Occupational History   Not on file  Tobacco Use   Smoking status: Former    Packs/day: 0.50    Years: 15.00    Total pack years: 7.50    Types: Cigarettes   Smokeless tobacco: Never   Tobacco comments:    Quit around age 62  Vaping Use   Vaping Use: Never used  Substance and Sexual Activity   Alcohol use: No   Drug use: No   Sexual activity: Not on file  Other Topics Concern   Not on file  Social History Narrative   Lives locally with husband.   Social Determinants of Health   Financial Resource Strain: Not on file  Food Insecurity: Not on file  Transportation Needs: Not on file  Physical Activity: Not on file  Stress: Not on file  Social Connections: Not on file     Family History: The patient's family history includes COPD in her brother; Colon cancer (age of onset: 43) in her brother and sister; Colon polyps in her brother and sister; Heart attack in her sister; Heart attack (age of onset: 14) in her brother; Heart attack (age of onset: 37) in her father; Heart disease in her brother, father, sister, and sister; Heart failure in her brother; Lung cancer in her brother. There is no history of Esophageal cancer, Rectal cancer, or Stomach cancer.  ROS:   Please see the history of present illness.    +palpitations +dyspnea on exertion All other systems reviewed and are negative.  Labs/Other Studies Reviewed:    The following studies were reviewed today:  LHC 10/21  Prox RCA to Mid RCA lesion is 99% stenosed. Mid RCA lesion is 95% stenosed. Scoring balloon angioplasty was performed using a BALLOON WOLVERINE 3.00X10, followed by a 3.5 [place] balloon, on both lesions. Post  intervention, there is a 0% residual stenosis. The left ventricular systolic function is normal. LV end diastolic pressure is normal. The left ventricular ejection fraction is 55-65% by visual estimate. There is no aortic valve stenosis.  Continue aggressive secondary prevention.  Patient with some residual pain, likely related to some decreased flow in marginal branches.    LHC 1/22  The left ventricular systolic function is normal. The left ventricular ejection fraction is 50-55% by visual estimate. LV end diastolic pressure is low. -------------------- Proximal RCA stent is 95% stenosed, in-stent restenosis (subsequent) Mid-distal RCA stent is 75% stenosed.-In-stent restenosis (subsequent) Scoring balloon angioplasty was performed on both lesions using a BALLOON WOLVERINE 3.00X15. Noncompliant balloon angioplasty balloon angioplasty was performed using a 3.5 mm by 15 mm [place] balloon. A drug-eluting stent was successfully placed open from the distal stent edge to the mid stented segment, using a SYNERGY XD 3.50X38. Postdilated to 3.8 mm A second drug-eluting stent was successfully placed overlapping the original stent and the new stent for the proximal segment, using a SYNERGY XD 3.50X28. Postdilated to 3.8 mm Post intervention, there is a 0% residual stenosis throughout the entire stented segment (the entire old stent segment was covered with new stents). -------------------------------------- Dist RCA lesion is 20% stenosed. Otherwise minimal LCA disease.   SUMMARY Severe 2 site recurrent in-stent restenosis of the extensive proximal to distal RCA stents -> proximal-mid 95% ISR, distal stent 75% ISR Successful Scoring Balloon followed by [place] Balloon Angioplasty lesion preparation for 2 additional overlapping stents placed covering the 2 old stents using Synergy DES 3.5 mm x 38 and 3.5 mm by 28 mm stent postdilated to 3.8 mm. Relatively normal left coronary arteries-no change Normal LV  function and EDP.     RECOMMENDATIONS Return to nursing unit for ongoing care.  Anticipate discharge in the morning. [facility] 1 reconsulted Continue aggressive risk modification Lifelong Thienopyridine antiplatelet agent.    Diagnostic Dominance: Right Intervention     Echo 12/20   Left Ventricle: Left ventricular ejection fraction, by visual estimation,  is 60 to 65%. The left ventricle has normal function. The left ventricular  internal cavity size was the left ventricle is normal in size. There is no  left ventricular hypertrophy.  Left ventricular diastolic parameters are indeterminate.  Right Ventricle: The right ventricular size is normal. No increase in  right ventricular wall thickness. Global RV systolic function is has  normal systolic function.  Left Atrium: Left atrial size was mildly dilated.  Right Atrium: Right atrial size was normal in size  Pericardium: There is no evidence of pericardial effusion.  Mitral Valve: The mitral valve is normal in structure. Mild mitral valve  regurgitation.  Tricuspid Valve: The tricuspid valve is normal in structure. Tricuspid  valve regurgitation is not demonstrated.  Aortic Valve: The aortic valve is tricuspid. Aortic valve regurgitation is  not visualized. The aortic valve is structurally normal, with no evidence  of sclerosis or stenosis.  Pulmonic Valve: The pulmonic valve was grossly normal. Pulmonic valve  regurgitation is trivial.  Aorta: The aortic root and ascending aorta are structurally normal, with  no evidence of dilitation.  Venous: The inferior vena cava is normal in size with greater than 50%  respiratory variability, suggesting right atrial pressure of 3 mmHg.  IAS/Shunts: The atrial septum is grossly normal.   Cardiac cath 05/26/22:  LEFT HEART CATH AND CORONARY ANGIOGRAPHY   Conclusion      Dist RCA lesion is 20% stenosed.   RV Branch lesion is 100% stenosed.   Non-stenotic Prox RCA lesion was  previously treated.   Non-stenotic Mid RCA lesion was previously treated.   The left ventricular systolic function is normal.   LV end diastolic pressure  is mildly elevated.   The left ventricular ejection fraction is 55-65% by visual estimate.   Nonobstructive CAD. Widely patent stents in the RCA Normal LV function Mildly elevated LVEDP   Plan: continue medical management  Coronary Diagrams  Diagnostic Dominance: Right  Intervention   Recent Labs: 07/31/2021: TSH 1.980 05/11/2022: BUN 13; Creatinine, Ser 1.14; Hemoglobin 10.7; Platelets 298; Potassium 4.9; Sodium 143  Recent Lipid Panel    Component Value Date/Time   CHOL 123 12/02/2021 0924   TRIG 131 12/02/2021 0924   HDL 59 12/02/2021 0924   CHOLHDL 2.1 12/02/2021 0924   CHOLHDL 1.4 08/27/2020 2213   VLDL 6 08/27/2020 2213   LDLCALC 41 12/02/2021 0924     Risk Assessment/Calculations:      Physical Exam:    VS:  BP 124/64   Pulse 65   Ht 5\' 3"  (1.6 m)   Wt 152 lb 9.6 oz (69.2 kg)   SpO2 96%   BMI 27.03 kg/m     Wt Readings from Last 3 Encounters:  06/17/22 152 lb 9.6 oz (69.2 kg)  05/26/22 145 lb (65.8 kg)  05/11/22 149 lb 9.6 oz (67.9 kg)     GEN:  Well nourished, well developed in no acute distress HEENT: Normal NECK: No JVD; No carotid bruits CARDIAC: RRR, no murmurs, rubs, gallops RESPIRATORY:  Clear to auscultation without rales, wheezing or rhonchi  ABDOMEN: Soft, non-tender, non-distended MUSCULOSKELETAL:  No edema; No deformity.  SKIN: Warm and dry NEUROLOGIC:  Alert and oriented x 3 PSYCHIATRIC:  Normal affect   EKG:  EKG is not ordered today.   Diagnoses:    1. Coronary artery disease of native artery of native heart with stable angina pectoris (HCC)   2. Essential hypertension   3. Hyperlipidemia LDL goal <70       Assessment and Plan:     CAD - excellent patency of RCA stents on recent cath.  Plan to continue DAPT. Continue Toprol. Cardiac cath showed widely patent stent in  RCA.    Palpitations: Monitor with brief episodes of PAT which were short lived. Continue low dose Toprol XL  Hyperlipidemia LDL goal < 70: LDL 41. Is managed by Dr. 07/11/22. On Repatha.  Disposition: follow up in 6 months   Medication Adjustments/Labs and Tests Ordered: Current medicines are reviewed at length with the patient today.  Concerns regarding medicines are outlined above.  No orders of the defined types were placed in this encounter.  No orders of the defined types were placed in this encounter.   There are no Patient Instructions on file for this visit.   Signed, Bernisha Verma Rennis Golden, MD  06/17/2022 10:42 AM    [facility]

## 2022-06-16 ENCOUNTER — Other Ambulatory Visit: Payer: Self-pay | Admitting: Internal Medicine

## 2022-06-17 ENCOUNTER — Encounter: Payer: Self-pay | Admitting: Cardiology

## 2022-06-17 ENCOUNTER — Ambulatory Visit: Payer: Medicare Other | Attending: Cardiology | Admitting: Cardiology

## 2022-06-17 VITALS — BP 124/64 | HR 65 | Ht 63.0 in | Wt 152.6 lb

## 2022-06-17 DIAGNOSIS — E785 Hyperlipidemia, unspecified: Secondary | ICD-10-CM | POA: Diagnosis not present

## 2022-06-17 DIAGNOSIS — I10 Essential (primary) hypertension: Secondary | ICD-10-CM | POA: Diagnosis not present

## 2022-06-17 DIAGNOSIS — I25118 Atherosclerotic heart disease of native coronary artery with other forms of angina pectoris: Secondary | ICD-10-CM

## 2022-06-29 DIAGNOSIS — D509 Iron deficiency anemia, unspecified: Secondary | ICD-10-CM | POA: Diagnosis not present

## 2022-06-29 DIAGNOSIS — F32A Depression, unspecified: Secondary | ICD-10-CM | POA: Diagnosis not present

## 2022-06-29 DIAGNOSIS — R5382 Chronic fatigue, unspecified: Secondary | ICD-10-CM | POA: Diagnosis not present

## 2022-06-29 DIAGNOSIS — I25119 Atherosclerotic heart disease of native coronary artery with unspecified angina pectoris: Secondary | ICD-10-CM | POA: Diagnosis not present

## 2022-08-11 ENCOUNTER — Other Ambulatory Visit: Payer: Self-pay | Admitting: Internal Medicine

## 2022-08-12 NOTE — Telephone Encounter (Signed)
Per chart review, has a primary care provider with [facility]. Will defer Rx to this practice

## 2022-09-22 DIAGNOSIS — R300 Dysuria: Secondary | ICD-10-CM | POA: Diagnosis not present

## 2022-10-14 DIAGNOSIS — H524 Presbyopia: Secondary | ICD-10-CM | POA: Diagnosis not present

## 2022-10-15 ENCOUNTER — Other Ambulatory Visit: Payer: Self-pay | Admitting: *Deleted

## 2022-10-15 DIAGNOSIS — E785 Hyperlipidemia, unspecified: Secondary | ICD-10-CM

## 2022-11-17 DIAGNOSIS — D508 Other iron deficiency anemias: Secondary | ICD-10-CM | POA: Diagnosis not present

## 2022-11-17 DIAGNOSIS — N39492 Postural (urinary) incontinence: Secondary | ICD-10-CM | POA: Diagnosis not present

## 2022-11-17 DIAGNOSIS — R5383 Other fatigue: Secondary | ICD-10-CM | POA: Diagnosis not present

## 2022-11-17 DIAGNOSIS — E785 Hyperlipidemia, unspecified: Secondary | ICD-10-CM | POA: Diagnosis not present

## 2022-11-29 DIAGNOSIS — R112 Nausea with vomiting, unspecified: Secondary | ICD-10-CM | POA: Diagnosis not present

## 2022-11-29 DIAGNOSIS — J189 Pneumonia, unspecified organism: Secondary | ICD-10-CM | POA: Diagnosis not present

## 2022-11-29 DIAGNOSIS — Z20822 Contact with and (suspected) exposure to covid-19: Secondary | ICD-10-CM | POA: Diagnosis not present

## 2022-11-29 DIAGNOSIS — R059 Cough, unspecified: Secondary | ICD-10-CM | POA: Diagnosis not present

## 2022-11-29 DIAGNOSIS — R0602 Shortness of breath: Secondary | ICD-10-CM | POA: Diagnosis not present

## 2022-11-30 DIAGNOSIS — R079 Chest pain, unspecified: Secondary | ICD-10-CM | POA: Diagnosis not present

## 2022-12-01 ENCOUNTER — Telehealth (HOSPITAL_BASED_OUTPATIENT_CLINIC_OR_DEPARTMENT_OTHER): Payer: Self-pay | Admitting: Internal Medicine

## 2022-12-01 NOTE — Telephone Encounter (Signed)
Patient returned call

## 2022-12-01 NOTE — Telephone Encounter (Signed)
Spoke to patient Dr.Hilty's advice given. 

## 2022-12-01 NOTE — Telephone Encounter (Signed)
Patient had lab work completed on 4/9. She wanted you to review her lipid panel.

## 2022-12-01 NOTE — Telephone Encounter (Signed)
Patient is calling to notify Dr. Rennis Golden she had a lipid panel performed on 04/09 he is able to see. Please advise.

## 2022-12-01 NOTE — Telephone Encounter (Signed)
I reviewed her labs - trigs are a little high, but LDL is very low. No changes to medicine. Watch diet for sugars and continue physical activity.  Dr. Rexene Edison

## 2022-12-01 NOTE — Telephone Encounter (Signed)
Left voicemail for patient to return call to office. 

## 2022-12-05 ENCOUNTER — Other Ambulatory Visit: Payer: Self-pay | Admitting: Cardiology

## 2022-12-05 ENCOUNTER — Other Ambulatory Visit (HOSPITAL_BASED_OUTPATIENT_CLINIC_OR_DEPARTMENT_OTHER): Payer: Self-pay | Admitting: Nurse Practitioner

## 2022-12-07 ENCOUNTER — Telehealth: Payer: Self-pay | Admitting: Cardiology

## 2022-12-07 NOTE — Telephone Encounter (Signed)
Pt c/o medication issue:  1. Name of Medication:  clopidogrel (PLAVIX) 75 MG tablet  metoprolol succinate (TOPROL-XL) 25 MG 24 hr tablet   2. How are you currently taking this medication (dosage and times per day)?   3. Are you having a reaction (difficulty breathing--STAT)?   4. What is your medication issue? Pt states she had called in for a refill for the medications, it was sent to the pharmacy, and the pharmacy confirmed receipt of it. However, the pharmacy called the pt and told her that it had been denied by the provider.   Pharmacy that medication was sent to was -  [id number] - [place], [place] - [address] AT [address] OF [address]

## 2022-12-07 NOTE — Telephone Encounter (Signed)
Spoke to patient-she states she received a message 10 minutes ago that her medications were now ready.  Advised Plavix and metoprolol were sent today and receipt confirmed.   She states she will call back if needed.

## 2023-01-08 ENCOUNTER — Other Ambulatory Visit: Payer: Self-pay | Admitting: Cardiology

## 2023-01-12 DIAGNOSIS — H18413 Arcus senilis, bilateral: Secondary | ICD-10-CM | POA: Diagnosis not present

## 2023-01-12 DIAGNOSIS — H26492 Other secondary cataract, left eye: Secondary | ICD-10-CM | POA: Diagnosis not present

## 2023-01-12 DIAGNOSIS — H02831 Dermatochalasis of right upper eyelid: Secondary | ICD-10-CM | POA: Diagnosis not present

## 2023-01-12 DIAGNOSIS — Z961 Presence of intraocular lens: Secondary | ICD-10-CM | POA: Diagnosis not present

## 2023-01-15 DIAGNOSIS — T753XXD Motion sickness, subsequent encounter: Secondary | ICD-10-CM | POA: Diagnosis not present

## 2023-01-15 DIAGNOSIS — M1990 Unspecified osteoarthritis, unspecified site: Secondary | ICD-10-CM | POA: Diagnosis not present

## 2023-01-15 DIAGNOSIS — N3281 Overactive bladder: Secondary | ICD-10-CM | POA: Diagnosis not present

## 2023-01-15 DIAGNOSIS — Z8701 Personal history of pneumonia (recurrent): Secondary | ICD-10-CM | POA: Diagnosis not present

## 2023-02-24 DIAGNOSIS — R300 Dysuria: Secondary | ICD-10-CM | POA: Diagnosis not present

## 2023-03-02 DIAGNOSIS — Z7982 Long term (current) use of aspirin: Secondary | ICD-10-CM | POA: Diagnosis not present

## 2023-03-02 DIAGNOSIS — I700 Atherosclerosis of aorta: Secondary | ICD-10-CM | POA: Diagnosis not present

## 2023-03-02 DIAGNOSIS — M79601 Pain in right arm: Secondary | ICD-10-CM | POA: Diagnosis not present

## 2023-03-02 DIAGNOSIS — R000 Tachycardia, unspecified: Secondary | ICD-10-CM | POA: Diagnosis not present

## 2023-03-02 DIAGNOSIS — I4891 Unspecified atrial fibrillation: Secondary | ICD-10-CM | POA: Diagnosis not present

## 2023-03-02 DIAGNOSIS — Z9989 Dependence on other enabling machines and devices: Secondary | ICD-10-CM | POA: Diagnosis not present

## 2023-03-02 DIAGNOSIS — M79602 Pain in left arm: Secondary | ICD-10-CM | POA: Diagnosis not present

## 2023-03-02 DIAGNOSIS — R079 Chest pain, unspecified: Secondary | ICD-10-CM | POA: Diagnosis not present

## 2023-03-02 DIAGNOSIS — R7989 Other specified abnormal findings of blood chemistry: Secondary | ICD-10-CM | POA: Diagnosis not present

## 2023-03-02 DIAGNOSIS — I252 Old myocardial infarction: Secondary | ICD-10-CM | POA: Diagnosis not present

## 2023-03-02 DIAGNOSIS — R0789 Other chest pain: Secondary | ICD-10-CM | POA: Diagnosis not present

## 2023-03-02 DIAGNOSIS — I2510 Atherosclerotic heart disease of native coronary artery without angina pectoris: Secondary | ICD-10-CM | POA: Diagnosis not present

## 2023-03-02 DIAGNOSIS — R6884 Jaw pain: Secondary | ICD-10-CM | POA: Diagnosis not present

## 2023-03-02 DIAGNOSIS — Z7902 Long term (current) use of antithrombotics/antiplatelets: Secondary | ICD-10-CM | POA: Diagnosis not present

## 2023-03-02 DIAGNOSIS — R9431 Abnormal electrocardiogram [ECG] [EKG]: Secondary | ICD-10-CM | POA: Diagnosis not present

## 2023-03-05 ENCOUNTER — Other Ambulatory Visit (HOSPITAL_BASED_OUTPATIENT_CLINIC_OR_DEPARTMENT_OTHER): Payer: Self-pay | Admitting: Cardiology

## 2023-03-05 NOTE — Progress Notes (Unsigned)
Cardiology Office Note:    Date:  03/08/2023   ID:  Sarah Pacheco, DOB Mar 14, 1946, MRN [id number]  PCP:  Patient, No Pcp Per   [facility] Providers Cardiologist:  Cranford Blessinger [place], MD     Referring MD: No ref. provider found   Chief Complaint: Afib  History of Present Illness:    Sarah Pacheco is a 77 y.o. female with a hx of MI, CAD, hyperlipidemia, GERD, anemia, anxiety, depression, and osteoarthritis.   Prior inferior STEMI complicated by VF arrest (during cath) and cardiogenic shock 12/20 requiring DES x 2. Echo revealed nl LVEF without rwma. In October 2021, she began experiencing intermittent substernal chest discomfort notable when lying down and with activity.  Symptoms improved with sitting up and SL nitroglycerin.  ED on 06/01/2020 normal troponins x2, EKG showed subtle inferior lateral ST depression.  She was pain-free and was subsequently discharged. Unfortunately she continued to have intermittent chest heaviness associated with occasional diaphoresis and radiation to the shoulders and down both arms, symptoms reminiscent of angina prior to MI. Admitted and underwent LHC which revealed severe in-stent restenosis of the RCA treated with scoring balloon PTCA, LVEF 55 to 65%.  Due to in-stent restenosis she was referred to lipid clinic and started on PCSK9 inhibitor.    She presented again to the ED on 08/27/20 with worsening chest pain described as a burning pain that radiates down both arms, similar to pain prior to MI, but not as severe.  Her pain was relieved with 3 sublingual nitroglycerin. LHC revealed severe 2 site recurrent in-stent restenosis of the extensive proximal to distal RCA stents proximal to mid 95% ISR, distal stent 75% ISR.  She had successful scoring balloon and angioplasty for 2 additional overlapping stents covering the 2 old stents. No change in left coronary arteries, normal LV function and EDP. Lifelong DAPT was recommended.   In Dec 2022 she complained of  palpitations.  ZIO monitor reevealed NSR, rare PACs and PVCs (<1%), infrequent short runs of PAT, no a fib, intermittent bundle branch block. She was placed on metoprolol up to 25 mg daily.   She was recently on vacation with family in [place]. She developed sudden onset of tachycardia associated with chest pressure and feeling of smothering. She called EMS. Ecg showed Afib with RVR. She went to ED. Converted to NSR spontaneously. Troponin level 16 (range 0-14). Normal D dimer. CXR OK. CT chest with no PE. Mild anemia other labs OK. Her Toprol dose was increased to 50 mg. She was started on Eliquis 5 mg bid. She denies any triggers for the Afib. None since.    Past Medical History:  Diagnosis Date   Anxiety    Arthritis    CAD (coronary artery disease)    a. 07/2019 Inf STEMI/VF Arrest/CGS/PCI: LM nl, LADmin irregs, LCX nl, RCA 100p/m (2.75x38 & 2.75x26 Resolute Onyx DESs).   Cataract    Chest pain    Depression    GERD (gastroesophageal reflux disease)    History of echocardiogram    a. 07/2019 Echo: EF 60-65%, no rwma, nl RV size/fxn, mildly dil LA, mild MR.   Hyperlipidemia with target LDL less than 70 10/25/2015    Past Surgical History:  Procedure Laterality Date   APPENDECTOMY     CATARACT EXTRACTION, BILATERAL     COLONOSCOPY  2017   X2     colon polyps   CORONARY BALLOON ANGIOPLASTY N/A 06/04/2020   Procedure: CORONARY BALLOON ANGIOPLASTY;  Surgeon: Lance Muss  S, MD;  Location: [facility] INVASIVE CV LAB;  Service: Cardiovascular;  Laterality: N/A;   CORONARY STENT INTERVENTION N/A 08/28/2020   Procedure: CORONARY STENT INTERVENTION;  Surgeon: Marykay Lex, MD;  Location: [facility] INVASIVE CV LAB;  Service: Cardiovascular;  Laterality: N/A;   CORONARY ULTRASOUND/IVUS N/A 06/04/2020   Procedure: Intravascular Ultrasound/IVUS;  Surgeon: Corky Crafts, MD;  Location: [facility] INVASIVE CV LAB;  Service: Cardiovascular;  Laterality: N/A;   CORONARY/GRAFT ACUTE MI REVASCULARIZATION  N/A 07/11/2019   Procedure: CORONARY/GRAFT ACUTE MI REVASCULARIZATION;  Surgeon: [place], Stephone Gum M, MD;  Location: [facility] INVASIVE CV LAB;  Service: Cardiovascular;  Laterality: N/A;   LAPAROTOMY N/A 10/10/2018   Procedure: EXPLORATORY LAPAROTOMY RIGHT COLECTOMY;  Surgeon: Emelia Loron, MD;  Location: [facility] ORS;  Service: General;  Laterality: N/A;   LEFT HEART CATH AND CORONARY ANGIOGRAPHY N/A 07/11/2019   Procedure: LEFT HEART CATH AND CORONARY ANGIOGRAPHY;  Surgeon: [place], Chaim Gatley M, MD;  Location: [facility] INVASIVE CV LAB;  Service: Cardiovascular;  Laterality: N/A;   LEFT HEART CATH AND CORONARY ANGIOGRAPHY N/A 06/04/2020   Procedure: LEFT HEART CATH AND CORONARY ANGIOGRAPHY;  Surgeon: Corky Crafts, MD;  Location: [facility] INVASIVE CV LAB;  Service: Cardiovascular;  Laterality: N/A;   LEFT HEART CATH AND CORONARY ANGIOGRAPHY N/A 08/28/2020   Procedure: LEFT HEART CATH AND CORONARY ANGIOGRAPHY;  Surgeon: Marykay Lex, MD;  Location: [facility] INVASIVE CV LAB;  Service: Cardiovascular;  Laterality: N/A;   LEFT HEART CATH AND CORONARY ANGIOGRAPHY N/A 05/26/2022   Procedure: LEFT HEART CATH AND CORONARY ANGIOGRAPHY;  Surgeon: [place], Deetta Siegmann M, MD;  Location: [facility] INVASIVE CV LAB;  Service: Cardiovascular;  Laterality: N/A;   TEMPORARY PACEMAKER N/A 07/11/2019   Procedure: TEMPORARY PACEMAKER;  Surgeon: [place], Jaziya Obarr M, MD;  Location: [facility] INVASIVE CV LAB;  Service: Cardiovascular;  Laterality: N/A;   VAGINAL HYSTERECTOMY  1973   AUB, fibroids    Current Medications: Allergies as of 03/08/2023       Reactions   Atorvastatin Other (See Comments)   Cramps in legs   Codeine Nausea And Vomiting   Erythromycin Base Nausea Only   Penicillin G Hives   Did it involve swelling of the face/tongue/throat, SOB, or low BP? No Did it involve sudden or severe rash/hives, skin peeling, or any reaction on the inside of your mouth or nose? Yes Did you need to seek medical attention at a hospital or doctor's office? Yes When  did it last happen?  2010 If all above answers are "NO", may proceed with cephalosporin use.   Rosuvastatin Other (See Comments)   Muscle pain        Medication List        Accurate as of March 08, 2023 10:20 AM. If you have any questions, ask your nurse or doctor.          STOP taking these medications    aspirin EC 81 MG tablet Stopped by: Sohana Austell [place]   escitalopram 10 MG tablet Commonly known as: LEXAPRO Stopped by: Devarion Mcclanahan [place]   ferrous sulfate 325 (65 FE) MG EC tablet Stopped by: Chantalle Defilippo [place]       TAKE these medications    acetaminophen 500 MG tablet Commonly known as: TYLENOL Take 1,000 mg by mouth every 6 (six) hours as needed for mild pain or headache.   cholecalciferol 25 MCG (1000 UNIT) tablet Commonly known as: VITAMIN D3 Take 1,000 Units by mouth daily.   clopidogrel 75 MG tablet Commonly known as: PLAVIX TAKE 1 TABLET(75 MG)  BY MOUTH DAILY   Eliquis 5 MG Tabs tablet Generic drug: apixaban Take 5 mg by mouth 2 (two) times daily.   metoprolol succinate 50 MG 24 hr tablet Commonly known as: Toprol XL Take 1 tablet (50 mg total) by mouth daily. Take with or immediately following a meal. What changed:  medication strength how much to take additional instructions Changed by: Donaven Criswell [place]   nitroGLYCERIN 0.4 MG SL tablet Commonly known as: NITROSTAT DISSOLVE 1 TABLET UNDER THE TONGUE EVERY 5 MINUTES AS NEEDED FOR CHEST PAIN FOR UP TO 3 DOSES. IF TAKING 3RD DOSE CALL 911.   Omega-3 1000 MG Caps Take by mouth daily.   oxybutynin 10 MG 24 hr tablet Commonly known as: DITROPAN-XL Take 1 tablet by mouth daily.   pantoprazole 40 MG tablet Commonly known as: PROTONIX Take 1 tablet (40 mg total) by mouth daily.   Repatha SureClick 140 MG/ML Soaj Generic drug: Evolocumab INJECT 1 DOSE UNDER THE SKIN ONCE EVERY 14 DAYS   simvastatin 40 MG tablet Commonly known as: ZOCOR TAKE 1 TABLET(40 MG) BY MOUTH DAILY   Vitamin B-12 5000 MCG  Tbdp Take 5,000 mcg by mouth daily.        Allergies:   Atorvastatin, Codeine, Erythromycin base, Penicillin g, and Rosuvastatin   Social History   Socioeconomic History   Marital status: Divorced    Spouse name: Not on file   Number of children: Not on file   Years of education: Not on file   Highest education level: Not on file  Occupational History   Not on file  Tobacco Use   Smoking status: Former    Current packs/day: 0.50    Average packs/day: 0.5 packs/day for 15.0 years (7.5 ttl pk-yrs)    Types: Cigarettes   Smokeless tobacco: Never   Tobacco comments:    Quit around age 40  Vaping Use   Vaping status: Never Used  Substance and Sexual Activity   Alcohol use: No   Drug use: No   Sexual activity: Not on file  Other Topics Concern   Not on file  Social History Narrative   Lives locally with husband.   Social Determinants of Health   Financial Resource Strain: Not on file  Food Insecurity: Low Risk  (11/14/2022)   [facility]   Food vital sign    Within the past 12 months, you worried that your food would run out before you got money to buy more: Never true    Within the past 12 months, the food you bought just didn't last and you didn't have money to get more. : Never true  Transportation Needs: No Transportation Needs (11/14/2022)   [facility]   Transportation    In the past 12 months, has lack of reliable transportation kept you from medical appointments, meetings, work or from getting things needed for daily living? : No  Physical Activity: Not on file  Stress: Not on file  Social Connections: Unknown (12/23/2021)   [facility]   Social Network    Social Network: Not on file     Family History: The patient's family history includes COPD in her brother; Colon cancer (age of onset: 17) in her brother and sister; Colon polyps in her brother and sister; Heart  attack in her sister; Heart attack (age of onset: 36) in her brother; Heart attack (age of onset: 60) in her father; Heart disease in her  brother, father, sister, and sister; Heart failure in her brother; Lung cancer in her brother. There is no history of Esophageal cancer, Rectal cancer, or Stomach cancer.  ROS:   Please see the history of present illness.    +palpitations +dyspnea on exertion All other systems reviewed and are negative.  Labs/Other Studies Reviewed:    The following studies were reviewed today:  LHC 10/21  Prox RCA to Mid RCA lesion is 99% stenosed. Mid RCA lesion is 95% stenosed. Scoring balloon angioplasty was performed using a BALLOON WOLVERINE 3.00X10, followed by a 3.5 [place] balloon, on both lesions. Post intervention, there is a 0% residual stenosis. The left ventricular systolic function is normal. LV end diastolic pressure is normal. The left ventricular ejection fraction is 55-65% by visual estimate. There is no aortic valve stenosis.   Continue aggressive secondary prevention.  Patient with some residual pain, likely related to some decreased flow in marginal branches.    LHC 1/22  The left ventricular systolic function is normal. The left ventricular ejection fraction is 50-55% by visual estimate. LV end diastolic pressure is low. -------------------- Proximal RCA stent is 95% stenosed, in-stent restenosis (subsequent) Mid-distal RCA stent is 75% stenosed.-In-stent restenosis (subsequent) Scoring balloon angioplasty was performed on both lesions using a BALLOON WOLVERINE 3.00X15. Noncompliant balloon angioplasty balloon angioplasty was performed using a 3.5 mm by 15 mm [place]rel [place] balloon. A drug-eluting stent was successfully placed open from the distal stent edge to the mid stented segment, using a SYNERGY XD 3.50X38. Postdilated to 3.8 mm A second drug-eluting stent was successfully placed overlapping the original stent and the new stent for the proximal  segment, using a SYNERGY XD 3.50X28. Postdilated to 3.8 mm Post intervention, there is a 0% residual stenosis throughout the entire stented segment (the entire old stent segment was covered with new stents). -------------------------------------- Dist RCA lesion is 20% stenosed. Otherwise minimal LCA disease.   SUMMARY Severe 2 site recurrent in-stent restenosis of the extensive proximal to distal RCA stents -> proximal-mid 95% ISR, distal stent 75% ISR Successful Scoring Balloon followed by [place] Balloon Angioplasty lesion preparation for 2 additional overlapping stents placed covering the 2 old stents using Synergy DES 3.5 mm x 38 and 3.5 mm by 28 mm stent postdilated to 3.8 mm. Relatively normal left coronary arteries-no change Normal LV function and EDP.     RECOMMENDATIONS Return to nursing unit for ongoing care.  Anticipate discharge in the morning. [facility] 1 reconsulted Continue aggressive risk modification Lifelong Thienopyridine antiplatelet agent.    Diagnostic Dominance: Right Intervention     Echo 12/20   Left Ventricle: Left ventricular ejection fraction, by visual estimation,  is 60 to 65%. The left ventricle has normal function. The left ventricular  internal cavity size was the left ventricle is normal in size. There is no  left ventricular hypertrophy.  Left ventricular diastolic parameters are indeterminate.  Right Ventricle: The right ventricular size is normal. No increase in  right ventricular wall thickness. Global RV systolic function is has  normal systolic function.  Left [facility]: Left atrial size was mildly dilated.  Right [facility]: Right atrial size was normal in size  Pericardium: There is no evidence of pericardial effusion.  Mitral Valve: The mitral valve is normal in structure. Mild mitral valve  regurgitation.  Tricuspid Valve: The tricuspid valve is normal in structure. Tricuspid  valve regurgitation is not demonstrated.  Aortic Valve: The aortic  valve is tricuspid. Aortic valve regurgitation is  not visualized. The aortic valve  is structurally normal, with no evidence  of sclerosis or stenosis.  Pulmonic Valve: The pulmonic valve was grossly normal. Pulmonic valve  regurgitation is trivial.  Aorta: The aortic root and ascending aorta are structurally normal, with  no evidence of dilitation.  Venous: The inferior vena cava is normal in size with greater than 50%  respiratory variability, suggesting right atrial pressure of 3 mmHg.  IAS/Shunts: The atrial septum is grossly normal.   Cardiac cath 05/26/22:  LEFT HEART CATH AND CORONARY ANGIOGRAPHY   Conclusion      Dist RCA lesion is 20% stenosed.   RV Branch lesion is 100% stenosed.   Non-stenotic Prox RCA lesion was previously treated.   Non-stenotic Mid RCA lesion was previously treated.   The left ventricular systolic function is normal.   LV end diastolic pressure is mildly elevated.   The left ventricular ejection fraction is 55-65% by visual estimate.   Nonobstructive CAD. Widely patent stents in the RCA Normal LV function Mildly elevated LVEDP   Plan: continue medical management  Coronary Diagrams  Diagnostic Dominance: Right  Intervention   Recent Labs: 05/11/2022: BUN 13; Creatinine, Ser 1.14; Hemoglobin 10.7; Platelets 298; Potassium 4.9; Sodium 143  Recent Lipid Panel    Component Value Date/Time   CHOL 123 12/02/2021 0924   TRIG 131 12/02/2021 0924   HDL 59 12/02/2021 0924   CHOLHDL 2.1 12/02/2021 0924   CHOLHDL 1.4 08/27/2020 2213   VLDL 6 08/27/2020 2213   LDLCALC 41 12/02/2021 0924   EKG Interpretation Date/Time:  Monday March 08 2023 09:28:19 EDT Ventricular Rate:  53 PR Interval:  132 QRS Duration:  80 QT Interval:  414 QTC Calculation: 388 R Axis:   14  Text Interpretation: Sinus bradycardia When compared with ECG of 29-Aug-2020 06:20, Nonspecific T wave abnormality, improved in Inferior leads Nonspecific T wave abnormality no longer  evident in Lateral leads Confirmed by [place], Iram Astorino [phone]) on 03/08/2023 10:15:19 AM    Risk Assessment/Calculations:      Physical Exam:    VS:  Pulse (!) 53   Ht 5\' 3"  (1.6 m)   Wt 152 lb 9.6 oz (69.2 kg)   SpO2 95%   BMI 27.03 kg/m     Wt Readings from Last 3 Encounters:  03/08/23 152 lb 9.6 oz (69.2 kg)  06/17/22 152 lb 9.6 oz (69.2 kg)  05/26/22 145 lb (65.8 kg)     GEN:  Well nourished, well developed in no acute distress HEENT: Normal NECK: No JVD; No carotid bruits CARDIAC: RRR, no murmurs, rubs, gallops RESPIRATORY:  Clear to auscultation without rales, wheezing or rhonchi  ABDOMEN: Soft, non-tender, non-distended MUSCULOSKELETAL:  No edema; No deformity.  SKIN: Warm and dry NEUROLOGIC:  Alert and oriented x 3 PSYCHIATRIC:  Normal affect     Diagnoses:    1. Coronary artery disease of native artery of native heart with stable angina pectoris (HCC)   2. Paroxysmal atrial fibrillation (HCC)   3. Hyperlipidemia LDL goal <70   4. Essential hypertension        Assessment and Plan:     CAD - excellent patency of RCA stents on last cath in October 2023.  Given need to take Eliquis now would recommend she stop ASA but continue Plavix.  Continue Toprol. Cardiac cath showed widely patent stent in RCA.   Paroxysmal Afib: no clear trigger. [place] vasc score of 4. Continue Eliquis 5 mg bid. On higher Toprol dose now. Will monitor for recurrent symptoms. Discussed using  Apple watch to detect Afib. If more frequent recurrences consider AAD therapy vs ablation. Instructed to take Extra Toprol prn. Will update Echo.   Hyperlipidemia LDL goal < 70: LDL 26. Is managed by Dr. Rennis Golden. On Repatha.  Disposition: follow up in 3 months   Medication Adjustments/Labs and Tests Ordered: Current medicines are reviewed at length with the patient today.  Concerns regarding medicines are outlined above.  Orders Placed This Encounter  Procedures   EKG 12-Lead   ECHOCARDIOGRAM  COMPLETE   Meds ordered this encounter  Medications   metoprolol succinate (TOPROL XL) 50 MG 24 hr tablet    Sig: Take 1 tablet (50 mg total) by mouth daily. Take with or immediately following a meal.    Dispense:  909 tablet    Refill:  3    Patient Instructions  Medication Instructions:  Stop Aspirin Take Plavix 75 mg daily Continue Eliquis 5 mg twice a day and all other medications *If you need a refill on your cardiac medications before your next appointment, please call your pharmacy*   Lab Work: None ordered   Testing/Procedures: Echo   Follow-Up: At [facility], you and your health needs are our priority.  As part of our continuing mission to provide you with exceptional heart care, we have created designated Provider Care Teams.  These Care Teams include your primary Cardiologist (physician) and Advanced Practice Providers (APPs -  Physician Assistants and Nurse Practitioners) who all work together to provide you with the care you need, when you need it.  We recommend signing up for the patient portal called "MyChart".  Sign up information is provided on this After Visit Summary.  MyChart is used to connect with patients for Virtual Visits (Telemedicine).  Patients are able to view lab/test results, encounter notes, upcoming appointments, etc.  Non-urgent messages can be sent to your provider as well.   To learn more about what you can do with MyChart, go to [email or web address].    Your next appointment:  3 months    Provider:  Dr.Marina Desire     Signed, Yeimy Brabant [place], MD  03/08/2023 10:20 AM    [facility]

## 2023-03-08 ENCOUNTER — Ambulatory Visit: Payer: Medicare Other | Attending: Cardiology | Admitting: Cardiology

## 2023-03-08 ENCOUNTER — Telehealth: Payer: Self-pay | Admitting: Cardiology

## 2023-03-08 ENCOUNTER — Encounter: Payer: Self-pay | Admitting: Cardiology

## 2023-03-08 VITALS — HR 53 | Ht 63.0 in | Wt 152.6 lb

## 2023-03-08 DIAGNOSIS — I10 Essential (primary) hypertension: Secondary | ICD-10-CM

## 2023-03-08 DIAGNOSIS — E785 Hyperlipidemia, unspecified: Secondary | ICD-10-CM

## 2023-03-08 DIAGNOSIS — I25118 Atherosclerotic heart disease of native coronary artery with other forms of angina pectoris: Secondary | ICD-10-CM | POA: Diagnosis not present

## 2023-03-08 DIAGNOSIS — I480 Paroxysmal atrial fibrillation: Secondary | ICD-10-CM

## 2023-03-08 MED ORDER — METOPROLOL SUCCINATE ER 50 MG PO TB24: 50.0000 mg | ORAL_TABLET | Freq: Every day | ORAL | 3 refills | Status: DC

## 2023-03-08 NOTE — Patient Instructions (Signed)
Medication Instructions:  Stop Aspirin Take Plavix 75 mg daily Continue Eliquis 5 mg twice a day and all other medications *If you need a refill on your cardiac medications before your next appointment, please call your pharmacy*   Lab Work: None ordered   Testing/Procedures: Echo   Follow-Up: At [facility], you and your health needs are our priority.  As part of our continuing mission to provide you with exceptional heart care, we have created designated Provider Care Teams.  These Care Teams include your primary Cardiologist (physician) and Advanced Practice Providers (APPs -  Physician Assistants and Nurse Practitioners) who all work together to provide you with the care you need, when you need it.  We recommend signing up for the patient portal called "MyChart".  Sign up information is provided on this After Visit Summary.  MyChart is used to connect with patients for Virtual Visits (Telemedicine).  Patients are able to view lab/test results, encounter notes, upcoming appointments, etc.  Non-urgent messages can be sent to your provider as well.   To learn more about what you can do with MyChart, go to [email or web address].    Your next appointment:  3 months    Provider:  Dr.Jordan

## 2023-03-08 NOTE — Addendum Note (Signed)
Addended by: Neoma Laming on: 03/08/2023 10:28 AM   Modules accepted: Orders

## 2023-03-08 NOTE — Telephone Encounter (Signed)
Nurse Elnita Maxwell resent prescription

## 2023-03-08 NOTE — Telephone Encounter (Signed)
Pt c/o medication issue:  1. Name of Medication:   metoprolol succinate (TOPROL XL) 50 MG 24 hr tablet    2. How are you currently taking this medication (dosage and times per day)? As prescribed   3. Are you having a reaction (difficulty breathing--STAT)?   4. What is your medication issue? [facility] is calling to get clarification on medication.  Disp amount is 909. Requesting call back to making sure this was the right amount.

## 2023-04-05 ENCOUNTER — Ambulatory Visit (HOSPITAL_COMMUNITY): Payer: Medicare Other | Attending: Cardiology

## 2023-04-05 DIAGNOSIS — I10 Essential (primary) hypertension: Secondary | ICD-10-CM | POA: Diagnosis not present

## 2023-04-05 DIAGNOSIS — I25118 Atherosclerotic heart disease of native coronary artery with other forms of angina pectoris: Secondary | ICD-10-CM | POA: Insufficient documentation

## 2023-04-05 DIAGNOSIS — E785 Hyperlipidemia, unspecified: Secondary | ICD-10-CM | POA: Insufficient documentation

## 2023-04-05 DIAGNOSIS — I480 Paroxysmal atrial fibrillation: Secondary | ICD-10-CM | POA: Insufficient documentation

## 2023-04-05 LAB — ECHOCARDIOGRAM COMPLETE
Area-P 1/2: 4.29 cm2
S' Lateral: 3 cm

## 2023-04-19 DIAGNOSIS — I4892 Unspecified atrial flutter: Secondary | ICD-10-CM | POA: Diagnosis not present

## 2023-04-19 DIAGNOSIS — I4891 Unspecified atrial fibrillation: Secondary | ICD-10-CM | POA: Diagnosis not present

## 2023-04-19 DIAGNOSIS — M1990 Unspecified osteoarthritis, unspecified site: Secondary | ICD-10-CM | POA: Diagnosis not present

## 2023-04-19 DIAGNOSIS — N3281 Overactive bladder: Secondary | ICD-10-CM | POA: Diagnosis not present

## 2023-05-13 ENCOUNTER — Telehealth: Payer: Self-pay | Admitting: Cardiology

## 2023-05-13 NOTE — Telephone Encounter (Signed)
*  STAT* If patient is at the pharmacy, call can be transferred to refill team.   1. Which medications need to be refilled? (please list name of each medication and dose if known)   ELIQUIS 5 MG TABS tablet   2. Would you like to learn more about the convenience, safety, & potential cost savings by using the [facility]?   3. Are you open to using the [facility] (Type [facility]. ).  4. Which pharmacy/location (including street and city if local pharmacy) is medication to be sent to?  [id number] - [place], [place] - [address]   5. Do they need a 30 day or 90 day supply?   30 day  Patient stated she is completely out of this medication. Patient has appointment scheduled on 10/21.

## 2023-05-14 MED ORDER — ELIQUIS 5 MG PO TABS: 5.0000 mg | ORAL_TABLET | Freq: Two times a day (BID) | ORAL | 1 refills | Status: DC

## 2023-05-25 NOTE — Progress Notes (Signed)
Cardiology Office Note:    Date:  05/31/2023   ID:  Sarah Pacheco, DOB [birth year], MRN [id number]  PCP:  Patient, No Pcp Per   [facility] Providers Cardiologist:  Andreika Vandagriff [place], MD     Referring MD: No ref. provider found   Chief Complaint: Afib  History of Present Illness:    Sarah Pacheco is a 77 y.o. female with a hx of MI, CAD, hyperlipidemia, GERD, anemia, anxiety, depression, and osteoarthritis.   Prior inferior STEMI complicated by VF arrest (during cath) and cardiogenic shock 12/20 requiring DES x 2. Echo revealed nl LVEF without rwma. In October 2021, she began experiencing intermittent substernal chest discomfort notable when lying down and with activity.  Symptoms improved with sitting up and SL nitroglycerin.  ED on 06/01/2020 normal troponins x2, EKG showed subtle inferior lateral ST depression.  She was pain-free and was subsequently discharged. Unfortunately she continued to have intermittent chest heaviness associated with occasional diaphoresis and radiation to the shoulders and down both arms, symptoms reminiscent of angina prior to MI. Admitted and underwent LHC which revealed severe in-stent restenosis of the RCA treated with scoring balloon PTCA, LVEF 55 to 65%.  Due to in-stent restenosis she was referred to lipid clinic and started on PCSK9 inhibitor.    She presented again to the ED on 08/27/20 with worsening chest pain described as a burning pain that radiates down both arms, similar to pain prior to MI, but not as severe.  Her pain was relieved with 3 sublingual nitroglycerin. LHC revealed severe 2 site recurrent in-stent restenosis of the extensive proximal to distal RCA stents proximal to mid 95% ISR, distal stent 75% ISR.  She had successful scoring balloon and angioplasty for 2 additional overlapping stents covering the 2 old stents. No change in left coronary arteries, normal LV function and EDP. Lifelong DAPT was recommended.   In Dec 2022 she complained of  palpitations.  ZIO monitor reevealed NSR, rare PACs and PVCs (<1%), infrequent short runs of PAT, no a fib, intermittent bundle branch block. She was placed on metoprolol up to 25 mg daily.   Earlier this year she was  on vacation with family in [place]. She developed sudden onset of tachycardia associated with chest pressure and feeling of smothering. She called EMS. Ecg showed Afib with RVR. She went to ED. Converted to NSR spontaneously. Troponin level 16 (range 0-14). Normal D dimer. CXR OK. CT chest with no PE. Mild anemia other labs OK. Her Toprol dose was increased to 50 mg. She was started on Eliquis 5 mg bid. She denies any triggers for the Afib. None since. Echo done in August was normal.   On follow up today she is doing well. No recurrent Afib. Has only had to use sl Ntg once. She is active and feels very well. Minor bruising.    Past Medical History:  Diagnosis Date   Anxiety    Arthritis    CAD (coronary artery disease)    a. 07/2019 Inf STEMI/VF Arrest/CGS/PCI: LM nl, LADmin irregs, LCX nl, RCA 100p/m (2.75x38 & 2.75x26 Resolute Onyx DESs).   Cataract    Chest pain    Depression    GERD (gastroesophageal reflux disease)    History of echocardiogram    a. 07/2019 Echo: EF 60-65%, no rwma, nl RV size/fxn, mildly dil LA, mild MR.   Hyperlipidemia with target LDL less than 70 10/25/2015    Past Surgical History:  Procedure Laterality Date   APPENDECTOMY  CATARACT EXTRACTION, BILATERAL     COLONOSCOPY  2017   X2     colon polyps   CORONARY BALLOON ANGIOPLASTY N/A 06/04/2020   Procedure: CORONARY BALLOON ANGIOPLASTY;  Surgeon: Corky Crafts, MD;  Location: [facility] INVASIVE CV LAB;  Service: Cardiovascular;  Laterality: N/A;   CORONARY STENT INTERVENTION N/A 08/28/2020   Procedure: CORONARY STENT INTERVENTION;  Surgeon: Marykay Lex, MD;  Location: [facility] INVASIVE CV LAB;  Service: Cardiovascular;  Laterality: N/A;   CORONARY ULTRASOUND/IVUS N/A 06/04/2020   Procedure:  Intravascular Ultrasound/IVUS;  Surgeon: Corky Crafts, MD;  Location: [facility] INVASIVE CV LAB;  Service: Cardiovascular;  Laterality: N/A;   CORONARY/GRAFT ACUTE MI REVASCULARIZATION N/A 07/11/2019   Procedure: CORONARY/GRAFT ACUTE MI REVASCULARIZATION;  Surgeon: [place], Yuli Lanigan M, MD;  Location: [facility] INVASIVE CV LAB;  Service: Cardiovascular;  Laterality: N/A;   LAPAROTOMY N/A 10/10/2018   Procedure: EXPLORATORY LAPAROTOMY RIGHT COLECTOMY;  Surgeon: Emelia Loron, MD;  Location: [facility] ORS;  Service: General;  Laterality: N/A;   LEFT HEART CATH AND CORONARY ANGIOGRAPHY N/A 07/11/2019   Procedure: LEFT HEART CATH AND CORONARY ANGIOGRAPHY;  Surgeon: [place], Chimaobi Casebolt M, MD;  Location: [facility] INVASIVE CV LAB;  Service: Cardiovascular;  Laterality: N/A;   LEFT HEART CATH AND CORONARY ANGIOGRAPHY N/A 06/04/2020   Procedure: LEFT HEART CATH AND CORONARY ANGIOGRAPHY;  Surgeon: Corky Crafts, MD;  Location: [facility] INVASIVE CV LAB;  Service: Cardiovascular;  Laterality: N/A;   LEFT HEART CATH AND CORONARY ANGIOGRAPHY N/A 08/28/2020   Procedure: LEFT HEART CATH AND CORONARY ANGIOGRAPHY;  Surgeon: Marykay Lex, MD;  Location: [facility] INVASIVE CV LAB;  Service: Cardiovascular;  Laterality: N/A;   LEFT HEART CATH AND CORONARY ANGIOGRAPHY N/A 05/26/2022   Procedure: LEFT HEART CATH AND CORONARY ANGIOGRAPHY;  Surgeon: [place], Celisa Schoenberg M, MD;  Location: [facility] INVASIVE CV LAB;  Service: Cardiovascular;  Laterality: N/A;   TEMPORARY PACEMAKER N/A 07/11/2019   Procedure: TEMPORARY PACEMAKER;  Surgeon: [place], Gavina Dildine M, MD;  Location: [facility] INVASIVE CV LAB;  Service: Cardiovascular;  Laterality: N/A;   VAGINAL HYSTERECTOMY  1973   AUB, fibroids    Current Medications: Allergies as of 05/31/2023       Reactions   Atorvastatin Other (See Comments)   Cramps in legs   Codeine Nausea And Vomiting   Erythromycin Base Nausea Only   Penicillin G Hives   Did it involve swelling of the face/tongue/throat, SOB, or low BP? No Did it  involve sudden or severe rash/hives, skin peeling, or any reaction on the inside of your mouth or nose? Yes Did you need to seek medical attention at a hospital or doctor's office? Yes When did it last happen?  2010 If all above answers are "NO", may proceed with cephalosporin use.   Rosuvastatin Other (See Comments)   Muscle pain        Medication List        Accurate as of May 31, 2023 10:23 AM. If you have any questions, ask your nurse or doctor.          STOP taking these medications    oxybutynin 10 MG 24 hr tablet Commonly known as: DITROPAN-XL Stopped by: Delma Drone [place]       TAKE these medications    acetaminophen 500 MG tablet Commonly known as: TYLENOL Take 1,000 mg by mouth every 6 (six) hours as needed for mild pain or headache.   cholecalciferol 25 MCG (1000 UNIT) tablet Commonly known as: VITAMIN D3 Take 1,000 Units by mouth daily.  clopidogrel 75 MG tablet Commonly known as: PLAVIX TAKE 1 TABLET(75 MG) BY MOUTH DAILY   Eliquis 5 MG Tabs tablet Generic drug: apixaban Take 1 tablet (5 mg total) by mouth 2 (two) times daily.   metoprolol succinate 50 MG 24 hr tablet Commonly known as: Toprol XL Take 1 tablet (50 mg total) by mouth daily. Take with or immediately following a meal.   nitroGLYCERIN 0.4 MG SL tablet Commonly known as: NITROSTAT DISSOLVE 1 TABLET UNDER THE TONGUE EVERY 5 MINUTES AS NEEDED FOR CHEST PAIN FOR UP TO 3 DOSES. IF TAKING 3RD DOSE CALL 911.   Omega-3 1000 MG Caps Take by mouth daily.   pantoprazole 40 MG tablet Commonly known as: PROTONIX Take 1 tablet (40 mg total) by mouth daily.   Repatha SureClick 140 MG/ML Soaj Generic drug: Evolocumab INJECT 1 DOSE( 140 MG) UNDER THE SKIN ONCE EVERY 14 DAYS   simvastatin 40 MG tablet Commonly known as: ZOCOR TAKE 1 TABLET(40 MG) BY MOUTH DAILY   Vitamin B-12 5000 MCG Tbdp Take 5,000 mcg by mouth daily.        Allergies:   Atorvastatin, Codeine, Erythromycin base,  Penicillin g, and Rosuvastatin   Social History   Socioeconomic History   Marital status: Divorced    Spouse name: Not on file   Number of children: Not on file   Years of education: Not on file   Highest education level: Not on file  Occupational History   Not on file  Tobacco Use   Smoking status: Former    Current packs/day: 0.50    Average packs/day: 0.5 packs/day for 15.0 years (7.5 ttl pk-yrs)    Types: Cigarettes   Smokeless tobacco: Never   Tobacco comments:    Quit around age 63  Vaping Use   Vaping status: Never Used  Substance and Sexual Activity   Alcohol use: No   Drug use: No   Sexual activity: Not on file  Other Topics Concern   Not on file  Social History Narrative   Lives locally with husband.   Social Determinants of Health   Financial Resource Strain: Not on file  Food Insecurity: Low Risk  (11/14/2022)   [facility]   Hunger Vital Sign    Worried About Running Out of Food in the Last Year: Never true    Ran Out of Food in the Last Year: Never true  Transportation Needs: No Transportation Needs (11/14/2022)   [facility]   Transportation    In the past 12 months, has lack of reliable transportation kept you from medical appointments, meetings, work or from getting things needed for daily living? : No  Physical Activity: Not on file  Stress: Not on file  Social Connections: Unknown (12/23/2021)   [facility]   Social Network    Social Network: Not on file     Family History: The patient's family history includes COPD in her brother; Colon cancer (age of onset: 79) in her brother and sister; Colon polyps in her brother and sister; Heart attack in her sister; Heart attack (age of onset: 35) in her brother; Heart attack (age of onset: 37) in her father; Heart disease in her brother, father, sister, and sister; Heart failure in her brother; Lung cancer in her  brother. There is no history of Esophageal cancer, Rectal cancer, or Stomach cancer.  ROS:   Please see the history of present illness.    +  palpitations +dyspnea on exertion All other systems reviewed and are negative.  Labs/Other Studies Reviewed:    The following studies were reviewed today:  LHC 10/21  Prox RCA to Mid RCA lesion is 99% stenosed. Mid RCA lesion is 95% stenosed. Scoring balloon angioplasty was performed using a BALLOON WOLVERINE 3.00X10, followed by a 3.5 [place] balloon, on both lesions. Post intervention, there is a 0% residual stenosis. The left ventricular systolic function is normal. LV end diastolic pressure is normal. The left ventricular ejection fraction is 55-65% by visual estimate. There is no aortic valve stenosis.   Continue aggressive secondary prevention.  Patient with some residual pain, likely related to some decreased flow in marginal branches.    LHC 1/22  The left ventricular systolic function is normal. The left ventricular ejection fraction is 50-55% by visual estimate. LV end diastolic pressure is low. -------------------- Proximal RCA stent is 95% stenosed, in-stent restenosis (subsequent) Mid-distal RCA stent is 75% stenosed.-In-stent restenosis (subsequent) Scoring balloon angioplasty was performed on both lesions using a BALLOON WOLVERINE 3.00X15. Noncompliant balloon angioplasty balloon angioplasty was performed using a 3.5 mm by 15 mm Peak balloon. A drug-eluting stent was successfully placed open from the distal stent edge to the mid stented segment, using a SYNERGY XD 3.50X38. Postdilated to 3.8 mm A second drug-eluting stent was successfully placed overlapping the original stent and the new stent for the proximal segment, using a SYNERGY XD 3.50X28. Postdilated to 3.8 mm Post intervention, there is a 0% residual stenosis throughout the entire stented segment (the entire old stent segment was covered with new  stents). -------------------------------------- Dist RCA lesion is 20% stenosed. Otherwise minimal LCA disease.   SUMMARY Severe 2 site recurrent in-stent restenosis of the extensive proximal to distal RCA stents -> proximal-mid 95% ISR, distal stent 75% ISR Successful Scoring Balloon followed by [place] Balloon Angioplasty lesion preparation for 2 additional overlapping stents placed covering the 2 old stents using Synergy DES 3.5 mm x 38 and 3.5 mm by 28 mm stent postdilated to 3.8 mm. Relatively normal left coronary arteries-no change Normal LV function and EDP.     RECOMMENDATIONS Return to nursing unit for ongoing care.  Anticipate discharge in the morning. [facility] 1 reconsulted Continue aggressive risk modification Lifelong Thienopyridine antiplatelet agent.    Diagnostic Dominance: Right Intervention     Echo 12/20   Left Ventricle: Left ventricular ejection fraction, by visual estimation,  is 60 to 65%. The left ventricle has normal function. The left ventricular  internal cavity size was the left ventricle is normal in size. There is no  left ventricular hypertrophy.  Left ventricular diastolic parameters are indeterminate.  Right Ventricle: The right ventricular size is normal. No increase in  right ventricular wall thickness. Global RV systolic function is has  normal systolic function.  Left [facility]: Left atrial size was mildly dilated.  Right [facility]: Right atrial size was normal in size  Pericardium: There is no evidence of pericardial effusion.  Mitral Valve: The mitral valve is normal in structure. Mild mitral valve  regurgitation.  Tricuspid Valve: The tricuspid valve is normal in structure. Tricuspid  valve regurgitation is not demonstrated.  Aortic Valve: The aortic valve is tricuspid. Aortic valve regurgitation is  not visualized. The aortic valve is structurally normal, with no evidence  of sclerosis or stenosis.  Pulmonic Valve: The pulmonic valve was grossly  normal. Pulmonic valve  regurgitation is trivial.  Aorta: The aortic root and ascending aorta are structurally normal, with  no evidence  of dilitation.  Venous: The inferior vena cava is normal in size with greater than 50%  respiratory variability, suggesting right atrial pressure of 3 mmHg.  IAS/Shunts: The atrial septum is grossly normal.   Cardiac cath 05/26/22:  LEFT HEART CATH AND CORONARY ANGIOGRAPHY   Conclusion      Dist RCA lesion is 20% stenosed.   RV Branch lesion is 100% stenosed.   Non-stenotic Prox RCA lesion was previously treated.   Non-stenotic Mid RCA lesion was previously treated.   The left ventricular systolic function is normal.   LV end diastolic pressure is mildly elevated.   The left ventricular ejection fraction is 55-65% by visual estimate.   Nonobstructive CAD. Widely patent stents in the RCA Normal LV function Mildly elevated LVEDP   Plan: continue medical management  Coronary Diagrams  Diagnostic Dominance: Right  Intervention  Echo 04/05/23: IMPRESSIONS     1. Left ventricular ejection fraction, by estimation, is 60 to 65%. Left  ventricular ejection fraction by PLAX is 62 %. The left ventricle has  normal function. The left ventricle has no regional wall motion  abnormalities. Left ventricular diastolic  parameters were normal. The average left ventricular global longitudinal  strain is -22.5 %. The global longitudinal strain is normal.   2. Right ventricular systolic function is normal. The right ventricular  size is normal. There is normal pulmonary artery systolic pressure. The  estimated right ventricular systolic pressure is 32.6 mmHg.   3. The mitral valve is abnormal. Trivial mitral valve regurgitation.   4. The aortic valve is tricuspid. Aortic valve regurgitation is not  visualized.   5. The inferior vena cava is normal in size with <50% respiratory  variability, suggesting right atrial pressure of 8 mmHg.   Comparison(s):  Changes from prior study are noted. 07/12/2019: LVEF 60-65%.    Recent Labs: No results found for requested labs within last 365 days.  Recent Lipid Panel    Component Value Date/Time   CHOL 123 12/02/2021 0924   TRIG 131 12/02/2021 0924   HDL 59 12/02/2021 0924   CHOLHDL 2.1 12/02/2021 0924   CHOLHDL 1.4 08/27/2020 2213   VLDL 6 08/27/2020 2213   LDLCALC 41 12/02/2021 0924        Risk Assessment/Calculations:      Physical Exam:    VS:  BP 116/74 (BP Location: Left Arm, Patient Position: Sitting, Cuff Size: Normal)   Pulse 68   Ht 5\' 3"  (1.6 m)   Wt 151 lb 6.4 oz (68.7 kg)   SpO2 (!) 66%   BMI 26.82 kg/m     Wt Readings from Last 3 Encounters:  05/31/23 151 lb 6.4 oz (68.7 kg)  03/08/23 152 lb 9.6 oz (69.2 kg)  06/17/22 152 lb 9.6 oz (69.2 kg)     GEN:  Well nourished, well developed in no acute distress HEENT: Normal NECK: No JVD; No carotid bruits CARDIAC: RRR, no murmurs, rubs, gallops RESPIRATORY:  Clear to auscultation without rales, wheezing or rhonchi  ABDOMEN: Soft, non-tender, non-distended MUSCULOSKELETAL:  No edema; No deformity.  SKIN: Warm and dry NEUROLOGIC:  Alert and oriented x 3 PSYCHIATRIC:  Normal affect     Diagnoses:    1. Coronary artery disease of native artery of native heart with stable angina pectoris (HCC)   2. Paroxysmal atrial fibrillation (HCC)   3. Hyperlipidemia LDL goal <70   4. Essential hypertension         Assessment and Plan:     CAD -  excellent patency of RCA stents on last cath in October 2023.  Given need to take Eliquis now would continue Plavix and not ASA.  Continue Toprol.  Paroxysmal Afib: no clear trigger. [place] vasc score of 4. Continue Eliquis 5 mg bid. On higher Toprol dose now. Will monitor for recurrent symptoms.  If more frequent recurrences consider AAD therapy vs ablation. Instructed to take Extra Toprol prn.  Echo was OK.   Hyperlipidemia LDL goal < 70: LDL 26. Is managed by Dr. Rennis Golden. On  Repatha.  Will check labs today including CBC, CMET and lipids.  Disposition: follow up in 6 months   Medication Adjustments/Labs and Tests Ordered: Current medicines are reviewed at length with the patient today.  Concerns regarding medicines are outlined above.  No orders of the defined types were placed in this encounter.  No orders of the defined types were placed in this encounter.   Patient Instructions  Medication Instructions:  Continue all current medications *If you need a refill on your cardiac medications before your next appointment, please call your pharmacy*   Lab Work: CBC, CMET, Lipid  today If you have labs (blood work) drawn today and your tests are completely normal, you will receive your results only by: MyChart Message (if you have MyChart) OR A paper copy in the mail If you have any lab test that is abnormal or we need to change your treatment, we will call you to review the results.   Testing/Procedures: none   Follow-Up: At [facility], you and your health needs are our priority.  As part of our continuing mission to provide you with exceptional heart care, we have created designated Provider Care Teams.  These Care Teams include your primary Cardiologist (physician) and Advanced Practice Providers (APPs -  Physician Assistants and Nurse Practitioners) who all work together to provide you with the care you need, when you need it.    Your next appointment:   6 month(s). Call office in Jan 2025 to schedule Appt April 2025  Provider:   Kimmi Acocella [place], MD     Signed, Sandara Tyree [place], MD  05/31/2023 10:23 AM    [facility]

## 2023-05-26 ENCOUNTER — Other Ambulatory Visit: Payer: Self-pay | Admitting: Internal Medicine

## 2023-05-26 DIAGNOSIS — I214 Non-ST elevation (NSTEMI) myocardial infarction: Secondary | ICD-10-CM

## 2023-05-26 DIAGNOSIS — I2510 Atherosclerotic heart disease of native coronary artery without angina pectoris: Secondary | ICD-10-CM

## 2023-05-26 DIAGNOSIS — E785 Hyperlipidemia, unspecified: Secondary | ICD-10-CM

## 2023-05-31 ENCOUNTER — Encounter: Payer: Self-pay | Admitting: Cardiology

## 2023-05-31 ENCOUNTER — Ambulatory Visit: Payer: Medicare Other | Attending: Cardiology | Admitting: Cardiology

## 2023-05-31 VITALS — BP 116/74 | HR 68 | Ht 63.0 in | Wt 151.4 lb

## 2023-05-31 DIAGNOSIS — I10 Essential (primary) hypertension: Secondary | ICD-10-CM

## 2023-05-31 DIAGNOSIS — I480 Paroxysmal atrial fibrillation: Secondary | ICD-10-CM | POA: Diagnosis not present

## 2023-05-31 DIAGNOSIS — I25118 Atherosclerotic heart disease of native coronary artery with other forms of angina pectoris: Secondary | ICD-10-CM | POA: Diagnosis not present

## 2023-05-31 DIAGNOSIS — E785 Hyperlipidemia, unspecified: Secondary | ICD-10-CM

## 2023-05-31 NOTE — Patient Instructions (Signed)
Medication Instructions:  Continue all current medications *If you need a refill on your cardiac medications before your next appointment, please call your pharmacy*   Lab Work: CBC, CMET, Lipid  today If you have labs (blood work) drawn today and your tests are completely normal, you will receive your results only by: MyChart Message (if you have MyChart) OR A paper copy in the mail If you have any lab test that is abnormal or we need to change your treatment, we will call you to review the results.   Testing/Procedures: none   Follow-Up: At [facility], you and your health needs are our priority.  As part of our continuing mission to provide you with exceptional heart care, we have created designated Provider Care Teams.  These Care Teams include your primary Cardiologist (physician) and Advanced Practice Providers (APPs -  Physician Assistants and Nurse Practitioners) who all work together to provide you with the care you need, when you need it.    Your next appointment:   6 month(s). Call office in Jan 2025 to schedule Appt April 2025  Provider:   Peter [place], MD

## 2023-06-01 LAB — COMPREHENSIVE METABOLIC PANEL
ALT: 6 [IU]/L (ref 0–32)
AST: 14 [IU]/L (ref 0–40)
Albumin: 4.3 g/dL (ref 3.8–4.8)
Alkaline Phosphatase: 68 [IU]/L (ref 44–121)
BUN/Creatinine Ratio: 13 (ref 12–28)
BUN: 16 mg/dL (ref 8–27)
Bilirubin Total: 0.3 mg/dL (ref 0.0–1.2)
CO2: 24 mmol/L (ref 20–29)
Calcium: 9.5 mg/dL (ref 8.7–10.3)
Chloride: 108 mmol/L — ABNORMAL HIGH (ref 96–106)
Creatinine, Ser: 1.19 mg/dL — ABNORMAL HIGH (ref 0.57–1.00)
Globulin, Total: 2.6 g/dL (ref 1.5–4.5)
Glucose: 96 mg/dL (ref 70–99)
Potassium: 5.1 mmol/L (ref 3.5–5.2)
Sodium: 142 mmol/L (ref 134–144)
Total Protein: 6.9 g/dL (ref 6.0–8.5)
eGFR: 48 mL/min/{1.73_m2} — ABNORMAL LOW (ref >59)

## 2023-06-01 LAB — CBC WITH DIFFERENTIAL/PLATELET
Basophils Absolute: 0.1 10*3/uL (ref 0.0–0.2)
Basos: 1 %
EOS (ABSOLUTE): 0.1 10*3/uL (ref 0.0–0.4)
Eos: 2 %
Hematocrit: 34.7 % (ref 34.0–46.6)
Hemoglobin: 11 g/dL — ABNORMAL LOW (ref 11.1–15.9)
Immature Grans (Abs): 0 10*3/uL (ref 0.0–0.1)
Immature Granulocytes: 1 %
Lymphocytes Absolute: 2.5 10*3/uL (ref 0.7–3.1)
Lymphs: 39 %
MCH: 28.9 pg (ref 26.6–33.0)
MCHC: 31.7 g/dL (ref 31.5–35.7)
MCV: 91 fL (ref 79–97)
Monocytes Absolute: 0.4 10*3/uL (ref 0.1–0.9)
Monocytes: 7 %
Neutrophils Absolute: 3.2 10*3/uL (ref 1.4–7.0)
Neutrophils: 50 %
Platelets: 324 10*3/uL (ref 150–450)
RBC: 3.81 x10E6/uL (ref 3.77–5.28)
RDW: 13.2 % (ref 11.7–15.4)
WBC: 6.4 10*3/uL (ref 3.4–10.8)

## 2023-06-01 LAB — LIPID PANEL
Chol/HDL Ratio: 1.9 ratio (ref 0.0–4.4)
Cholesterol, Total: 106 mg/dL (ref 100–199)
HDL: 55 mg/dL (ref >39)
LDL Chol Calc (NIH): 33 mg/dL (ref 0–99)
Triglycerides: 95 mg/dL (ref 0–149)
VLDL Cholesterol Cal: 18 mg/dL (ref 5–40)

## 2023-06-03 ENCOUNTER — Other Ambulatory Visit: Payer: Self-pay | Admitting: Internal Medicine

## 2023-07-12 ENCOUNTER — Other Ambulatory Visit: Payer: Self-pay | Admitting: Cardiology

## 2023-07-12 NOTE — Telephone Encounter (Signed)
Prescription refill request for Eliquis received. Indication:afib Last office visit:10/24 Scr:1.19  10/24 Age: 77 Weight:68.7  kg  Prescription refilled

## 2023-08-02 ENCOUNTER — Other Ambulatory Visit (HOSPITAL_COMMUNITY): Payer: Self-pay

## 2023-08-02 ENCOUNTER — Telehealth: Payer: Self-pay | Admitting: Pharmacy Technician

## 2023-08-02 NOTE — Telephone Encounter (Signed)
Pharmacy Patient Advocate Encounter   Received notification from Fax that prior authorization for repatha is required/requested.   Insurance verification completed.   The patient is insured through [facility] .   Per test claim: PA required; PA submitted to above mentioned insurance via CoverMyMeds Key/confirmation #/EOC [id number] Status is pending

## 2023-08-03 ENCOUNTER — Other Ambulatory Visit (HOSPITAL_COMMUNITY): Payer: Self-pay

## 2023-08-03 NOTE — Telephone Encounter (Signed)
Pharmacy Patient Advocate Encounter  Received notification from [facility] that Prior Authorization for repatha has been APPROVED from 08/02/23 to 08/01/24. Ran test claim, Copay is $133.16 one month. This test claim was processed through [facility]- copay amounts may vary at other pharmacies due to pharmacy/plan contracts, or as the patient moves through the different stages of their insurance plan.   PA #/Case ID/Reference #: [id number]

## 2023-08-10 ENCOUNTER — Other Ambulatory Visit: Payer: Self-pay

## 2023-08-10 MED ORDER — NITROGLYCERIN 0.4 MG SL SUBL: SUBLINGUAL_TABLET | SUBLINGUAL | 9 refills | Status: DC

## 2023-08-29 ENCOUNTER — Other Ambulatory Visit: Payer: Self-pay | Admitting: Cardiology

## 2023-09-12 DIAGNOSIS — R202 Paresthesia of skin: Secondary | ICD-10-CM | POA: Diagnosis not present

## 2023-09-12 DIAGNOSIS — R079 Chest pain, unspecified: Secondary | ICD-10-CM | POA: Diagnosis not present

## 2023-09-12 DIAGNOSIS — R0789 Other chest pain: Secondary | ICD-10-CM | POA: Diagnosis not present

## 2023-09-14 ENCOUNTER — Encounter: Payer: Self-pay | Admitting: Cardiology

## 2023-09-14 DIAGNOSIS — R079 Chest pain, unspecified: Secondary | ICD-10-CM | POA: Diagnosis not present

## 2023-09-14 NOTE — Progress Notes (Addendum)
 Cardiology Office Note:    Date:  09/20/2023   ID:  Sarah Pacheco, DOB [birth year], MRN [id number]  PCP:  Patient, No Pcp Per   [facility] Providers Cardiologist:  Valincia Touch [place], MD     Referring MD: No ref. provider found   Chief Complaint: Afib  History of Present Illness:    Sarah Pacheco is a 78 y.o. female is seen for evaluation of recurrent atrial fibrillation. She has a hx of MI, CAD, hyperlipidemia, GERD, anemia, anxiety, depression, and osteoarthritis.   Prior inferior STEMI complicated by VF arrest (during cath) and cardiogenic shock 12/20 requiring DES x 2. Echo revealed nl LVEF without rwma. In October 2021, she began experiencing intermittent substernal chest discomfort notable when lying down and with activity.  Symptoms improved with sitting up and SL nitroglycerin .  ED on 06/01/2020 normal troponins x2, EKG showed subtle inferior lateral ST depression.  She was pain-free and was subsequently discharged. Unfortunately she continued to have intermittent chest heaviness associated with occasional diaphoresis and radiation to the shoulders and down both arms, symptoms reminiscent of angina prior to MI. Admitted and underwent LHC which revealed severe in-stent restenosis of the RCA treated with scoring balloon PTCA, LVEF 55 to 65%.  Due to in-stent restenosis she was referred to lipid clinic and started on PCSK9 inhibitor.    She presented again to the ED on 08/27/20 with worsening chest pain described as a burning pain that radiates down both arms, similar to pain prior to MI, but not as severe.  Her pain was relieved with 3 sublingual nitroglycerin . LHC revealed severe 2 site recurrent in-stent restenosis of the extensive proximal to distal RCA stents proximal to mid 95% ISR, distal stent 75% ISR.  She had successful scoring balloon and angioplasty for 2 additional overlapping stents covering the 2 old stents. No change in left coronary arteries, normal LV function and EDP.  Lifelong DAPT was recommended.   In Dec 2022 she complained of palpitations.  ZIO monitor reevealed NSR, rare PACs and PVCs (<1%), infrequent short runs of PAT, no a fib, intermittent bundle branch block. She was placed on metoprolol  up to 25 mg daily.   In July 2024 she was  on vacation with family in [place] . She developed sudden onset of tachycardia associated with chest pressure and feeling of smothering. She called EMS. Ecg showed Afib with RVR. She went to ED. Converted to NSR spontaneously. Troponin level 16 (range 0-14). Normal D dimer. CXR OK. CT chest with no PE. Mild anemia other labs OK. Her Toprol  dose was increased to 50 mg. She was started on Eliquis  5 mg bid. She denies any triggers for the Afib. None since. Echo done in August was normal.   More recently she was seen at the hospital in [facility] on Sep 16, 2023. She states at 10 pm she developed Afib with RVR. Felt heart racing and bilateral arm pain. Took 3 sl Ntg without relief. Went to ED and was in Afib with RVR. Troponins were negative. Hgb 9. She did convert spontaneously. No Afib since.    Past Medical History:  Diagnosis Date   Anxiety    Arthritis    CAD (coronary artery disease)    a. 07/2019 Inf STEMI/VF Arrest/CGS/PCI: LM nl, LADmin irregs, LCX nl, RCA 100p/m (2.75x38 & 2.75x26 Resolute Onyx DESs).   Cataract    Chest pain    Depression    GERD (gastroesophageal reflux disease)    History of echocardiogram  a. 07/2019 Echo: EF 60-65%, no rwma, nl RV size/fxn, mildly dil LA, mild MR.   Hyperlipidemia with target LDL less than 70 10/25/2015    Past Surgical History:  Procedure Laterality Date   APPENDECTOMY     CATARACT EXTRACTION, BILATERAL     COLONOSCOPY  2017   X2     colon polyps   CORONARY BALLOON ANGIOPLASTY N/A 06/04/2020   Procedure: CORONARY BALLOON ANGIOPLASTY;  Surgeon: Lucendia Rusk, MD;  Location: [facility] INVASIVE CV LAB;  Service: Cardiovascular;  Laterality: N/A;   CORONARY STENT  INTERVENTION N/A 08/28/2020   Procedure: CORONARY STENT INTERVENTION;  Surgeon: Arleen Lacer, MD;  Location[facility] INVASIVE CV LAB;  Service: Cardiovascular;  Laterality: N/A;   CORONARY ULTRASOUND/IVUS N/A 06/04/2020   Procedure: Intravascular Ultrasound/IVUS;  Surgeon: Lucendia Rusk, MD;  Location: [facility] INVASIVE CV LAB;  Service: Cardiovascular;  Laterality: N/A;   CORONARY/GRAFT ACUTE MI REVASCULARIZATION N/A 07/11/2019   Procedure: CORONARY/GRAFT ACUTE MI REVASCULARIZATION;  Surgeon: [place], Alivea Gladson M, MD;  Location: [facility] INVASIVE CV LAB;  Service: Cardiovascular;  Laterality: N/A;   LAPAROTOMY N/A 10/10/2018   Procedure: EXPLORATORY LAPAROTOMY RIGHT COLECTOMY;  Surgeon: Enid Harry, MD;  Location: [facility] ORS;  Service: General;  Laterality: N/A;   LEFT HEART CATH AND CORONARY ANGIOGRAPHY N/A 07/11/2019   Procedure: LEFT HEART CATH AND CORONARY ANGIOGRAPHY;  Surgeon: [place], Kaylanni Ezelle M, MD;  Location: [facility] INVASIVE CV LAB;  Service: Cardiovascular;  Laterality: N/A;   LEFT HEART CATH AND CORONARY ANGIOGRAPHY N/A 06/04/2020   Procedure: LEFT HEART CATH AND CORONARY ANGIOGRAPHY;  Surgeon: Lucendia Rusk, MD;  Location: [facility] INVASIVE CV LAB;  Service: Cardiovascular;  Laterality: N/A;   LEFT HEART CATH AND CORONARY ANGIOGRAPHY N/A 08/28/2020   Procedure: LEFT HEART CATH AND CORONARY ANGIOGRAPHY;  Surgeon: Arleen Lacer, MD;  Location: [facility] INVASIVE CV LAB;  Service: Cardiovascular;  Laterality: N/A;   LEFT HEART CATH AND CORONARY ANGIOGRAPHY N/A 05/26/2022   Procedure: LEFT HEART CATH AND CORONARY ANGIOGRAPHY;  Surgeon: [place], Delane Wessinger M, MD;  Location: [facility] INVASIVE CV LAB;  Service: Cardiovascular;  Laterality: N/A;   TEMPORARY PACEMAKER N/A 07/11/2019   Procedure: TEMPORARY PACEMAKER;  Surgeon: [place], Krystyl Cannell M, MD;  Location: [facility] INVASIVE CV LAB;  Service: Cardiovascular;  Laterality: N/A;   VAGINAL HYSTERECTOMY  1973   AUB, fibroids    Current Medications: Allergies as of 09/20/2023        Reactions   Atorvastatin Other (See Comments)   Cramps in legs   Codeine Nausea And Vomiting   Erythromycin Base Nausea Only   Penicillin G Hives   Did it involve swelling of the face/tongue/throat, SOB, or low BP? No Did it involve sudden or severe rash/hives, skin peeling, or any reaction on the inside of your mouth or nose? Yes Did you need to seek medical attention at a hospital or doctor's office? Yes When did it last happen?  2010 If all above answers are "NO", may proceed with cephalosporin use.   Rosuvastatin  Other (See Comments)   Muscle pain        Medication List        Accurate as of September 20, 2023  4:44 PM. If you have any questions, ask your nurse or doctor.          acetaminophen  500 MG tablet Commonly known as: TYLENOL  Take 1,000 mg by mouth every 6 (six) hours as needed for mild pain or headache.   cholecalciferol 25 MCG (1000 UNIT) tablet Commonly known  as: VITAMIN D3 Take 1,000 Units by mouth daily.   clopidogrel  75 MG tablet Commonly known as: PLAVIX  TAKE 1 TABLET(75 MG) BY MOUTH DAILY   Eliquis  5 MG Tabs tablet Generic drug: apixaban  TAKE 1 TABLET(5 MG) BY MOUTH TWICE DAILY   metoprolol  succinate 50 MG 24 hr tablet Commonly known as: Toprol  XL Take 1 tablet (50 mg total) by mouth daily. Take with or immediately following a meal.   nitroGLYCERIN  0.4 MG SL tablet Commonly known as: NITROSTAT  DISSOLVE 1 TABLET UNDER THE TONGUE EVERY 5 MINUTES AS NEEDED FOR CHEST PAIN FOR UP TO 3 DOSES. IF TAKING 3RD DOSE CALL 911.   Omega-3 1000 MG Caps Take by mouth daily.   pantoprazole  40 MG tablet Commonly known as: PROTONIX  Take 1 tablet (40 mg total) by mouth daily.   Repatha  SureClick 140 MG/ML Soaj Generic drug: Evolocumab  INJECT 1 DOSE( 140 MG) UNDER THE SKIN ONCE EVERY 14 DAYS   simvastatin  40 MG tablet Commonly known as: ZOCOR  TAKE 1 TABLET(40 MG) BY MOUTH DAILY   Vitamin B-12 5000 MCG Tbdp Take 5,000 mcg by mouth daily.         Allergies:   Atorvastatin, Codeine, Erythromycin base, Penicillin g, and Rosuvastatin    Social History   Socioeconomic History   Marital status: Divorced    Spouse name: Not on file   Number of children: Not on file   Years of education: Not on file   Highest education level: Not on file  Occupational History   Not on file  Tobacco Use   Smoking status: Former    Current packs/day: 0.50    Average packs/day: 0.5 packs/day for 15.0 years (7.5 ttl pk-yrs)    Types: Cigarettes   Smokeless tobacco: Never   Tobacco comments:    Quit around age 54  Vaping Use   Vaping status: Never Used  Substance and Sexual Activity   Alcohol use: No   Drug use: No   Sexual activity: Not Currently    Partners: Male    Comment: WIDOWED  Other Topics Concern   Not on file  Social History Narrative   Lives locally with husband.   Social Drivers of Corporate investment banker Strain: Not on file  Food Insecurity: Low Risk  (11/14/2022)   [facility]   Hunger Vital Sign    Worried About Running Out of Food in the Last Year: Never true    Ran Out of Food in the Last Year: Never true  Transportation Needs: No Transportation Needs (11/14/2022)   [facility]   Transportation    In the past 12 months, has lack of reliable transportation kept you from medical appointments, meetings, work or from getting things needed for daily living? : No  Physical Activity: Not on file  Stress: Not on file  Social Connections: Unknown (12/23/2021)   [facility]   Social Network    Social Network: Not on file     Family History: The patient's family history includes COPD in her brother; Colon cancer (age of onset: 9) in her brother and sister; Colon polyps in her brother and sister; Heart attack in her sister; Heart attack (age of onset: 3) in her brother; Heart attack (age of onset: 39) in her father; Heart disease  in her brother, father, sister, and sister; Heart failure in her brother; Lung cancer in her brother. There is no history of Esophageal cancer, Rectal  cancer, or Stomach cancer.  ROS:   Please see the history of present illness.    +palpitations +dyspnea on exertion All other systems reviewed and are negative.  Labs/Other Studies Reviewed:    The following studies were reviewed today:  LHC 10/21  Prox RCA to Mid RCA lesion is 99% stenosed. Mid RCA lesion is 95% stenosed. Scoring balloon angioplasty was performed using a BALLOON WOLVERINE 3.00X10, followed by a 3.5 [place] balloon, on both lesions. Post intervention, there is a 0% residual stenosis. The left ventricular systolic function is normal. LV end diastolic pressure is normal. The left ventricular ejection fraction is 55-65% by visual estimate. There is no aortic valve stenosis.   Continue aggressive secondary prevention.  Patient with some residual pain, likely related to some decreased flow in marginal branches.    LHC 1/22  The left ventricular systolic function is normal. The left ventricular ejection fraction is 50-55% by visual estimate. LV end diastolic pressure is low. -------------------- Proximal RCA stent is 95% stenosed, in-stent restenosis (subsequent) Mid-distal RCA stent is 75% stenosed.-In-stent restenosis (subsequent) Scoring balloon angioplasty was performed on both lesions using a BALLOON WOLVERINE 3.00X15. Noncompliant balloon angioplasty balloon angioplasty was performed using a 3.5 mm by 15 mm [place] balloon. A drug-eluting stent was successfully placed open from the distal stent edge to the mid stented segment, using a SYNERGY XD 3.50X38. Postdilated to 3.8 mm A second drug-eluting stent was successfully placed overlapping the original stent and the new stent for the proximal segment, using a SYNERGY XD 3.50X28. Postdilated to 3.8 mm Post intervention, there is a 0% residual stenosis throughout the entire  stented segment (the entire old stent segment was covered with new stents). -------------------------------------- Dist RCA lesion is 20% stenosed. Otherwise minimal LCA disease.   SUMMARY Severe 2 site recurrent in-stent restenosis of the extensive proximal to distal RCA stents -> proximal-mid 95% ISR, distal stent 75% ISR Successful Scoring Balloon followed by [place] Balloon Angioplasty lesion preparation for 2 additional overlapping stents placed covering the 2 old stents using Synergy DES 3.5 mm x 38 and 3.5 mm by 28 mm stent postdilated to 3.8 mm. Relatively normal left coronary arteries-no change Normal LV function and EDP.     RECOMMENDATIONS Return to nursing unit for ongoing care.  Anticipate discharge in the morning. [facility] 1 reconsulted Continue aggressive risk modification Lifelong Thienopyridine antiplatelet agent.    Diagnostic Dominance: Right Intervention     Echo 12/20   Left Ventricle: Left ventricular ejection fraction, by visual estimation,  is 60 to 65%. The left ventricle has normal function. The left ventricular  internal cavity size was the left ventricle is normal in size. There is no  left ventricular hypertrophy.  Left ventricular diastolic parameters are indeterminate.  Right Ventricle: The right ventricular size is normal. No increase in  right ventricular wall thickness. Global RV systolic function is has  normal systolic function.  Left Atrium: Left atrial size was mildly dilated.  Right Atrium: Right atrial size was normal in size  Pericardium: There is no evidence of pericardial effusion.  Mitral Valve: The mitral valve is normal in structure. Mild mitral valve  regurgitation.  Tricuspid Valve: The tricuspid valve is normal in structure. Tricuspid  valve regurgitation is not demonstrated.  Aortic Valve: The aortic valve is tricuspid. Aortic valve regurgitation is  not visualized. The aortic valve is structurally normal, with no evidence  of  sclerosis or stenosis.  Pulmonic Valve: The pulmonic valve was grossly normal. Pulmonic valve  regurgitation is trivial.  Aorta: The aortic root and ascending aorta are structurally normal, with  no evidence of dilitation.  Venous: The inferior vena cava is normal in size with greater than 50%  respiratory variability, suggesting right atrial pressure of 3 mmHg.  IAS/Shunts: The atrial septum is grossly normal.   Cardiac cath 05/26/22:  LEFT HEART CATH AND CORONARY ANGIOGRAPHY   Conclusion      Dist RCA lesion is 20% stenosed.   RV Branch lesion is 100% stenosed.   Non-stenotic Prox RCA lesion was previously treated.   Non-stenotic Mid RCA lesion was previously treated.   The left ventricular systolic function is normal.   LV end diastolic pressure is mildly elevated.   The left ventricular ejection fraction is 55-65% by visual estimate.   Nonobstructive CAD. Widely patent stents in the RCA Normal LV function Mildly elevated LVEDP   Plan: continue medical management  Coronary Diagrams  Diagnostic Dominance: Right  Intervention  Echo 04/05/23: IMPRESSIONS     1. Left ventricular ejection fraction, by estimation, is 60 to 65%. Left  ventricular ejection fraction by PLAX is 62 %. The left ventricle has  normal function. The left ventricle has no regional wall motion  abnormalities. Left ventricular diastolic  parameters were normal. The average left ventricular global longitudinal  strain is -22.5 %. The global longitudinal strain is normal.   2. Right ventricular systolic function is normal. The right ventricular  size is normal. There is normal pulmonary artery systolic pressure. The  estimated right ventricular systolic pressure is 32.6 mmHg.   3. The mitral valve is abnormal. Trivial mitral valve regurgitation.   4. The aortic valve is tricuspid. Aortic valve regurgitation is not  visualized.   5. The inferior vena cava is normal in size with <50% respiratory   variability, suggesting right atrial pressure of 8 mmHg.   Comparison(s): Changes from prior study are noted. 07/12/2019: LVEF 60-65%.    Recent Labs: 05/31/2023: ALT 6; BUN 16; Creatinine, Ser 1.19; Hemoglobin 11.0; Platelets 324; Potassium 5.1; Sodium 142  Recent Lipid Panel    Component Value Date/Time   CHOL 106 05/31/2023 1053   TRIG 95 05/31/2023 1053   HDL 55 05/31/2023 1053   CHOLHDL 1.9 05/31/2023 1053   CHOLHDL 1.4 08/27/2020 2213   VLDL 6 08/27/2020 2213   LDLCALC 33 05/31/2023 1053        Risk Assessment/Calculations:      Physical Exam:    VS:  BP 135/71   Pulse 75   Ht 5\' 3"  (1.6 m)   Wt 154 lb (69.9 kg)   SpO2 98%   BMI 27.28 kg/m     Wt Readings from Last 3 Encounters:  09/20/23 154 lb (69.9 kg)  05/31/23 151 lb 6.4 oz (68.7 kg)  03/08/23 152 lb 9.6 oz (69.2 kg)     GEN:  Well nourished, well developed in no acute distress HEENT: Normal NECK: No JVD; No carotid bruits CARDIAC: RRR, no murmurs, rubs, gallops RESPIRATORY:  Clear to auscultation without rales, wheezing or rhonchi  ABDOMEN: Soft, non-tender, non-distended MUSCULOSKELETAL:  No edema; No deformity.  SKIN: Warm and dry NEUROLOGIC:  Alert and oriented x 3 PSYCHIATRIC:  Normal affect     Diagnoses:    1. Paroxysmal atrial fibrillation (HCC)   2. Coronary artery disease of native artery of native heart with stable angina pectoris (HCC)   3. Hyperlipidemia LDL goal <70   4. Essential hypertension  Assessment and Plan:     CAD - excellent patency of RCA stents on last cath in October 2023.    Continue Toprol . On Plavix  long term. Troponin negative with recent Afib episode.   Paroxysmal Afib now with recurrence. Quite symptomatic.  no clear trigger. [place] vasc score of 4. Continue Eliquis  5 mg bid for now.  On higher Toprol  dose for rate control. I would like to have her see EP for consideration of ablation and Watchman device. I am concerned that she has chronic  anemia. She reports she hasn't been able to tolerate iron transfusions before. She is currently on Eliquis  and Plavix  and will need to take plavix  indefinitely. Her bleeding risk would be much lower if we could get her off Eliquis .   Hyperlipidemia LDL goal < 70: LDL 26. Is managed by Dr. Maximo Spar. On Repatha .  Will arrange EP evaluation with Dr Marven Slimmer  I have seen Sarah Pacheco in the office today.  She is being seen for consideration for Left Atrial Appendage Closure with the Watchman Device for management of stroke risk. Based upon past history, it has been determined that she is a poor candidate for long-term oral anticoagulation.  However, she may be tolerant of short-term treatment with an anticoagulant as necessary.  A shared decision has been made utilizing the [employer] shared decision tool to undergo Left Atrial Appendage Closure with Watchman device at this time.    Medication Adjustments/Labs and Tests Ordered: Current medicines are reviewed at length with the patient today.  Concerns regarding medicines are outlined above.  Orders Placed This Encounter  Procedures   Ambulatory referral to Cardiac Electrophysiology   No orders of the defined types were placed in this encounter.   Patient Instructions  Medication Instructions:  Continue same medications *If you need a refill on your cardiac medications before your next appointment, please call your pharmacy*   Lab Work: None ordered   Testing/Procedures: None ordered   Follow-Up: At [facility], you and your health needs are our priority.  As part of our continuing mission to provide you with exceptional heart care, we have created designated Provider Care Teams.  These Care Teams include your primary Cardiologist (physician) and Advanced Practice Providers (APPs -  Physician Assistants and Nurse Practitioners) who all work together to provide you with the care you need, when you  need it.  We recommend signing up for the patient portal called "MyChart".  Sign up information is provided on this After Visit Summary.  MyChart is used to connect with patients for Virtual Visits (Telemedicine).  Patients are able to view lab/test results, encounter notes, upcoming appointments, etc.  Non-urgent messages can be sent to your provider as well.   To learn more about what you can do with MyChart, go to [email or web address].    Your next appointment:  6 months   Call in April to schedule August appointment ( New Office )    Provider:  Dr.Travante Knee        Sarah Pacheco Thurs 2/13 at 11:30 am    [facility]        Signed, Marius Betts [place], MD  09/20/2023 4:44 PM    [facility]

## 2023-09-14 NOTE — Telephone Encounter (Signed)
Records requested from [facility].

## 2023-09-20 ENCOUNTER — Encounter: Payer: Self-pay | Admitting: Cardiology

## 2023-09-20 ENCOUNTER — Ambulatory Visit: Payer: Medicare Other | Attending: Cardiology | Admitting: Cardiology

## 2023-09-20 VITALS — BP 135/71 | HR 75 | Ht 63.0 in | Wt 154.0 lb

## 2023-09-20 DIAGNOSIS — E785 Hyperlipidemia, unspecified: Secondary | ICD-10-CM

## 2023-09-20 DIAGNOSIS — I25118 Atherosclerotic heart disease of native coronary artery with other forms of angina pectoris: Secondary | ICD-10-CM

## 2023-09-20 DIAGNOSIS — I480 Paroxysmal atrial fibrillation: Secondary | ICD-10-CM

## 2023-09-20 DIAGNOSIS — I10 Essential (primary) hypertension: Secondary | ICD-10-CM

## 2023-09-20 NOTE — Patient Instructions (Signed)
 Medication Instructions:  Continue same medications *If you need a refill on your cardiac medications before your next appointment, please call your pharmacy*   Lab Work: None ordered   Testing/Procedures: None ordered   Follow-Up: At [facility], you and your health needs are our priority.  As part of our continuing mission to provide you with exceptional heart care, we have created designated Provider Care Teams.  These Care Teams include your primary Cardiologist (physician) and Advanced Practice Providers (APPs -  Physician Assistants and Nurse Practitioners) who all work together to provide you with the care you need, when you need it.  We recommend signing up for the patient portal called "MyChart".  Sign up information is provided on this After Visit Summary.  MyChart is used to connect with patients for Virtual Visits (Telemedicine).  Patients are able to view lab/test results, encounter notes, upcoming appointments, etc.  Non-urgent messages can be sent to your provider as well.   To learn more about what you can do with MyChart, go to [email or web address].    Your next appointment:  6 months   Call in April to schedule August appointment ( New Office )    Provider:  Dr.Jordan        Sarah Pacheco Thurs 2/13 at 11:30 am    [facility]

## 2023-09-23 ENCOUNTER — Ambulatory Visit: Payer: Medicare Other | Attending: Cardiology | Admitting: Cardiology

## 2023-09-23 ENCOUNTER — Encounter: Payer: Self-pay | Admitting: Cardiology

## 2023-09-23 VITALS — BP 112/70 | HR 62 | Ht 63.0 in | Wt 153.8 lb

## 2023-09-23 DIAGNOSIS — I2510 Atherosclerotic heart disease of native coronary artery without angina pectoris: Secondary | ICD-10-CM

## 2023-09-23 DIAGNOSIS — I480 Paroxysmal atrial fibrillation: Secondary | ICD-10-CM | POA: Diagnosis not present

## 2023-09-23 NOTE — Patient Instructions (Signed)
Medication Instructions:  Your physician recommends that you continue on your current medications as directed. Please refer to the Current Medication list given to you today.  *If you need a refill on your cardiac medications before your next appointment, please call your pharmacy*   Testing/Procedures: Ablation Your physician has recommended that you have an ablation. Catheter ablation is a medical procedure used to treat some cardiac arrhythmias (irregular heartbeats). During catheter ablation, a long, thin, flexible tube is put into a blood vessel in your groin (upper thigh), or neck. This tube is called an ablation catheter. It is then guided to your heart through the blood vessel. Radio frequency waves destroy small areas of heart tissue where abnormal heartbeats may cause an arrhythmia to start. Please see the instruction sheet given to you today.  Watchman  Your physician has requested that you have Left atrial appendage (LAA) closure device implantation is a procedure to put a small device in the LAA of the heart. The LAA is a small sac in the wall of the heart's left upper chamber. Blood clots can form in this area. The device, Watchman closes the LAA to help prevent a blood clot and stroke.   Follow-Up: Please give [place] a call if you would like to schedule procedures

## 2023-09-23 NOTE — Progress Notes (Signed)
Electrophysiology Office Note:    Date:  09/23/2023   ID:  BRAILEY BUESCHER, DOB 08/29/1945, MRN [id number]  [facility] Cardiologist:  Peter Swaziland, MD  [facility] Electrophysiologist:  Lanier Prude, MD   Referring MD: Swaziland, Peter M, MD   Chief Complaint: Atrial fibrillation  History of Present Illness:    Ms. Sarah Pacheco is a 78 year old woman who I am seeing today for an evaluation of atrial fibrillation at the request of Dr. Swaziland.  The patient last saw Dr. Swaziland September 20, 2023.  She has a history of myocardial infarction, coronary artery disease, hyperlipidemia, GERD, anemia, anxiety, depression, osteoarthritis.  In 2020 she had a inferior STEMI complicated by VF arrest.  She has had multiple layers of stents and is on lifelong DAPT.  She was diagnosed with atrial fibrillation while on vacation in July 2024.  She was started on Eliquis.  She has had multiple episodes of symptomatic atrial fibrillation resulting in ER visits.  She has chronic anemia and has required iron transfusions in the past.  Given the need for lifelong clopidogrel use, watchman is being considered.       Their past medical, social and family history was reviewed.   ROS:   Please see the history of present illness.    All other systems reviewed and are negative.  EKGs/Labs/Other Studies Reviewed:    The following studies were reviewed today:  April 05, 2023 echo EF 60-65 RV normal Trivial MR  March 15, 2023 [facility] records Rhythm strip demonstrating atrial fibrillation       Physical Exam:    VS:  BP 112/70   Pulse 62   Ht 5\' 3"  (1.6 m)   Wt 153 lb 12.8 oz (69.8 kg)   SpO2 98%   BMI 27.24 kg/m     Wt Readings from Last 3 Encounters:  09/23/23 153 lb 12.8 oz (69.8 kg)  09/20/23 154 lb (69.9 kg)  05/31/23 151 lb 6.4 oz (68.7 kg)     GEN: no distress CARD: RRR, No MRG RESP: No IWOB. CTAB.        ASSESSMENT AND PLAN:    1. Paroxysmal atrial  fibrillation (HCC)   2. Coronary artery disease involving native coronary artery of native heart without angina pectoris     #Atrial fibrillation Multiple presentations to hospital with symptomatic atrial fibrillation.  Rhythm control is indicated.  I discussed treatment options including antiarrhythmic drugs and catheter ablation.  ---------------  Discussed treatment options today for AF including antiarrhythmic drug therapy and ablation. Discussed risks, recovery and likelihood of success with each treatment strategy. Risk, benefits, and alternatives to EP study and ablation for afib were discussed. These risks include but are not limited to stroke, bleeding, vascular damage, tamponade, perforation, damage to the esophagus, lungs, phrenic nerve and other structures, pulmonary vein stenosis, worsening renal function, coronary vasospasm and death.  Discussed potential need for repeat ablation procedures and antiarrhythmic drugs after an initial ablation. The patient understands these risks.  We will therefore proceed with catheter ablation at the next available time.  Carto, ICE, anesthesia are requested for the procedure.  Will also obtain CT PV protocol prior to the procedure to exclude LAA thrombus and further evaluate atrial anatomy.  -----------------  I have seen Sarah Pacheco in the office today who is being considered for a Watchman left atrial appendage closure device. I believe they will benefit from this procedure given their history of atrial fibrillation, CHA2DS2-VASc score of 4  and unadjusted ischemic stroke rate of 4.8% per year. Unfortunately, the patient is not felt to be a long term anticoagulation candidate secondary to anemia and need for lifelong clopidogrel therapy. The patient's chart has been reviewed and I feel that they would be a candidate for short term oral anticoagulation after Watchman implant.   It is my belief that after undergoing a LAA closure procedure, Sarah Pacheco will not need long term anticoagulation which eliminates anticoagulation side effects and major bleeding risk.   Procedural risks for the Watchman implant have been reviewed with the patient including a 0.5% risk of stroke, <1% risk of perforation and <1% risk of device embolization. Other risks include bleeding, vascular damage, tamponade, worsening renal function, and death. The patient understands these risks.     The published clinical data on the safety and effectiveness of WATCHMAN include but are not limited to the following: - Holmes DR, Everlene Farrier, Sick P et al. for the PROTECT AF Investigators. Percutaneous closure of the left atrial appendage versus warfarin therapy for prevention of stroke in patients with atrial fibrillation: a randomised non-inferiority trial. Lancet 2009; 374: 534-42. Everlene Farrier, Doshi SK, Isa Rankin D et al. on behalf of the PROTECT AF Investigators. Percutaneous Left Atrial Appendage Closure for Stroke Prophylaxis in Patients With Atrial Fibrillation 2.3-Year Follow-up of the PROTECT AF (Watchman Left Atrial Appendage System for Embolic Protection in Patients With Atrial Fibrillation) Trial. Circulation 2013; 127:720-729. - Alli O, Doshi S,  Kar S, Reddy VY, Sievert H et al. Quality of Life Assessment in the Randomized PROTECT AF (Percutaneous Closure of the Left Atrial Appendage Versus Warfarin Therapy for Prevention of Stroke in Patients With Atrial Fibrillation) Trial of Patients at Risk for Stroke With Nonvalvular Atrial Fibrillation. J Am Coll Cardiol 2013; 61:1790-8. Aline August DR, Mia [place], Price M, Whisenant B, Sievert H, Doshi S, Huber K, Reddy V. Prospective randomized evaluation of the Watchman left atrial appendage Device in patients with atrial fibrillation versus long-term warfarin therapy; the PREVAIL trial. Journal of the [employer] of Cardiology, Vol. 4, No. 1, 2014, 1-11. - Kar S, Doshi SK, Sadhu A, Horton R, Osorio J et al. Primary outcome  evaluation of a next-generation left atrial appendage closure device: results from the PINNACLE FLX trial. Circulation 2021;143(18)1754-1762.    After today's visit with the patient which was dedicated solely for shared decision making visit regarding LAA closure device, the patient decided to proceed with the LAA appendage closure procedure scheduled to be done in the near future at [facility]. Prior to the procedure, I would like to obtain a gated CT scan of the chest with contrast timed for PV/LA visualization.   Marland Kitchen  HAS-BLED score 3 Hypertension No  Abnormal renal and liver function (Dialysis, transplant, Cr >2.26 mg/dL /Cirrhosis or Bilirubin >2x Normal or AST/ALT/AP >3x Normal) No  Stroke No  Bleeding Yes  Labile INR (Unstable/high INR) No  Elderly (>65) Yes  Drugs or alcohol (>= 8 drinks/week, anti-plt or NSAID) Yes   CHA2DS2-VASc Score = 4  The patient's score is based upon: CHF History: 0 HTN History: 0 Diabetes History: 0 Stroke History: 0 Vascular Disease History: 1 Age Score: 2 Gender Score: 1      She would like some time to consider her options and will let us know if she wants to proceed.     Signed, Rossie Muskrat. Lalla Brothers, MD, [facility], [facility] 09/23/2023 1:27 PM    Electrophysiology  Medical Group  [facility]

## 2023-09-27 ENCOUNTER — Encounter: Payer: Self-pay | Admitting: Cardiology

## 2023-09-27 ENCOUNTER — Other Ambulatory Visit: Payer: Self-pay | Admitting: Cardiology

## 2023-09-27 MED ORDER — CLOPIDOGREL BISULFATE 75 MG PO TABS: 75.0000 mg | ORAL_TABLET | Freq: Every day | ORAL | 2 refills | Status: DC

## 2023-09-27 NOTE — Telephone Encounter (Signed)
 Called and spoke to [facility] pharmacy  Pharmacy states:   -They did not get 08/30/23 prescription   -patient last filled Rx 05/2023  -new prescription needed   New Rx for Plavix 75mg  daily sent 90x2

## 2023-10-04 ENCOUNTER — Telehealth: Payer: Self-pay | Admitting: Cardiology

## 2023-10-04 DIAGNOSIS — I480 Paroxysmal atrial fibrillation: Secondary | ICD-10-CM

## 2023-10-04 NOTE — Telephone Encounter (Signed)
 Patient called to talk with Dr. Lalla Brothers or nurse in regards to [facility] procedure

## 2023-10-05 NOTE — Telephone Encounter (Signed)
Left message. Will call back tomorrow.

## 2023-10-05 NOTE — Telephone Encounter (Signed)
 The patient is travelling and it is difficult to understand her words as service is poor. Will call the patient this afternoon.

## 2023-10-07 NOTE — Telephone Encounter (Signed)
Left message to call back Monday

## 2023-10-11 NOTE — Telephone Encounter (Signed)
 The patient had her consult with Dr. Lalla Brothers 2/13. Ablation/Watchman was discussed and she told the office she would call if she wished to proceed.  Ms. Rubey called and requested to only have Watchman, not ablation. She reported she has only had 2 episodes of afib in the last year and her concerns are her anemia and medication cost. While this is her ideal plan, she is willing to do ablation if Dr. Lalla Brothers thinks it is best for her.  Informed her that this will be discussed with Dr. Lalla Brothers and she will be called to confirm plan and schedule cCT.

## 2023-10-14 DIAGNOSIS — H524 Presbyopia: Secondary | ICD-10-CM | POA: Diagnosis not present

## 2023-10-25 ENCOUNTER — Other Ambulatory Visit: Payer: Self-pay

## 2023-10-25 DIAGNOSIS — I480 Paroxysmal atrial fibrillation: Secondary | ICD-10-CM

## 2023-10-25 NOTE — Addendum Note (Signed)
 Addended by: Gunnar Fusi A on: 10/25/2023 03:13 PM   Modules accepted: Orders

## 2023-10-25 NOTE — Telephone Encounter (Signed)
 The patient reported she would like to proceed with LAAO only at this time. Scheduled her for cCT 11/11/2023. Per patient request, will hold her a spot for LAAO on 12/02/2023 should the CT scan show anatomy is favorable. She was grateful for call and agreed with plan.

## 2023-11-01 ENCOUNTER — Telehealth: Payer: Self-pay

## 2023-11-01 NOTE — Telephone Encounter (Signed)
 Marland Kitchen

## 2023-11-03 DIAGNOSIS — I480 Paroxysmal atrial fibrillation: Secondary | ICD-10-CM | POA: Diagnosis not present

## 2023-11-03 LAB — CBC WITH DIFFERENTIAL/PLATELET
Basophils Absolute: 0.1 10*3/uL (ref 0.0–0.2)
Basos: 1 %
EOS (ABSOLUTE): 0.1 10*3/uL (ref 0.0–0.4)
Eos: 2 %
Hematocrit: 28.8 % — ABNORMAL LOW (ref 34.0–46.6)
Hemoglobin: 8.9 g/dL — ABNORMAL LOW (ref 11.1–15.9)
Immature Grans (Abs): 0 10*3/uL (ref 0.0–0.1)
Immature Granulocytes: 0 %
Lymphocytes Absolute: 2.4 10*3/uL (ref 0.7–3.1)
Lymphs: 33 %
MCH: 24.7 pg — ABNORMAL LOW (ref 26.6–33.0)
MCHC: 30.9 g/dL — ABNORMAL LOW (ref 31.5–35.7)
MCV: 80 fL (ref 79–97)
Monocytes Absolute: 0.6 10*3/uL (ref 0.1–0.9)
Monocytes: 9 %
Neutrophils Absolute: 4 10*3/uL (ref 1.4–7.0)
Neutrophils: 55 %
Platelets: 328 10*3/uL (ref 150–450)
RBC: 3.6 x10E6/uL — ABNORMAL LOW (ref 3.77–5.28)
RDW: 15.6 % — ABNORMAL HIGH (ref 11.7–15.4)
WBC: 7.3 10*3/uL (ref 3.4–10.8)

## 2023-11-04 LAB — BASIC METABOLIC PANEL WITH GFR
BUN/Creatinine Ratio: 13 (ref 12–28)
BUN: 14 mg/dL (ref 8–27)
CO2: 22 mmol/L (ref 20–29)
Calcium: 9.6 mg/dL (ref 8.7–10.3)
Chloride: 108 mmol/L — ABNORMAL HIGH (ref 96–106)
Creatinine, Ser: 1.12 mg/dL — ABNORMAL HIGH (ref 0.57–1.00)
Glucose: 101 mg/dL — ABNORMAL HIGH (ref 70–99)
Potassium: 4.7 mmol/L (ref 3.5–5.2)
Sodium: 144 mmol/L (ref 134–144)
eGFR: 51 mL/min/{1.73_m2} — ABNORMAL LOW (ref >59)

## 2023-11-06 ENCOUNTER — Encounter: Payer: Self-pay | Admitting: Cardiology

## 2023-11-08 ENCOUNTER — Telehealth: Payer: Self-pay

## 2023-11-08 DIAGNOSIS — I480 Paroxysmal atrial fibrillation: Secondary | ICD-10-CM

## 2023-11-08 NOTE — Telephone Encounter (Signed)
-----   Message from Rossie Muskrat [facility] sent at 11/06/2023  3:09 PM EDT ----- Labwork reviewed. Hemoglobin is lower than it was 5 months ago.   Katy and Carlyl, can we order repeat CBC in 1 week. I want to make sure this is stable before we proceed with any procedures.    Sheria Lang T. Lalla Brothers, MD, [facility], [facility] Cardiac Electrophysiology

## 2023-11-08 NOTE — Telephone Encounter (Signed)
 The patient has been notified of the result and verbalized understanding.  All questions (if any) were answered. Frutoso Schatz, RN 11/08/2023 9:10 AM  Labs have been ordered.

## 2023-11-10 DIAGNOSIS — I480 Paroxysmal atrial fibrillation: Secondary | ICD-10-CM | POA: Diagnosis not present

## 2023-11-10 LAB — CBC
Hematocrit: 27.8 % — ABNORMAL LOW (ref 34.0–46.6)
Hemoglobin: 8.5 g/dL — ABNORMAL LOW (ref 11.1–15.9)
MCH: 24.7 pg — ABNORMAL LOW (ref 26.6–33.0)
MCHC: 30.6 g/dL — ABNORMAL LOW (ref 31.5–35.7)
MCV: 81 fL (ref 79–97)
Platelets: 349 10*3/uL (ref 150–450)
RBC: 3.44 x10E6/uL — ABNORMAL LOW (ref 3.77–5.28)
RDW: 16.2 % — ABNORMAL HIGH (ref 11.7–15.4)
WBC: 6.7 10*3/uL (ref 3.4–10.8)

## 2023-11-11 ENCOUNTER — Ambulatory Visit (HOSPITAL_COMMUNITY)
Admission: RE | Admit: 2023-11-11 | Discharge: 2023-11-11 | Disposition: A | Discharge status: Home / Self Care | Source: Ambulatory Visit | Attending: Cardiology | Admitting: Cardiology

## 2023-11-11 DIAGNOSIS — I480 Paroxysmal atrial fibrillation: Secondary | ICD-10-CM | POA: Insufficient documentation

## 2023-11-11 MED ORDER — IOHEXOL 350 MG/ML SOLN
95.0000 mL | Freq: Once | INTRAVENOUS | Status: AC | PRN
  Administered 2023-11-11: 95 mL via INTRAVENOUS

## 2023-11-18 ENCOUNTER — Telehealth: Payer: Self-pay

## 2023-11-18 NOTE — Telephone Encounter (Signed)
 Sarah Pacheco: Chicken wing with adequate neck Max 28.6/ AVG 24/ Depth 17 Likely use a 31mm device Inf/Mid TSP RAO 16 CAU 24

## 2023-11-19 ENCOUNTER — Other Ambulatory Visit: Payer: Self-pay

## 2023-11-19 ENCOUNTER — Telehealth: Payer: Self-pay

## 2023-11-19 DIAGNOSIS — I4891 Unspecified atrial fibrillation: Secondary | ICD-10-CM

## 2023-11-19 NOTE — Telephone Encounter (Signed)
 Discussed lab work with Dr. Lalla Brothers. The patient's most recent hgb was 8.5 (down from 8.9). Per Dr. Lalla Brothers, will have the patient get repeat abs 4/21. If hgb drops below 8 or she is having frank bleeding, she will not proceed with LAAO on 4/24. If hgb stays above 8, she may proceed with LAAO.  The patient was grateful for call and agreed with plan.

## 2023-11-24 ENCOUNTER — Ambulatory Visit: Payer: Medicare Other | Admitting: Cardiology

## 2023-11-25 ENCOUNTER — Other Ambulatory Visit: Payer: Self-pay

## 2023-11-25 DIAGNOSIS — I480 Paroxysmal atrial fibrillation: Secondary | ICD-10-CM

## 2023-11-29 DIAGNOSIS — I4891 Unspecified atrial fibrillation: Secondary | ICD-10-CM | POA: Diagnosis not present

## 2023-11-29 LAB — CBC WITH DIFFERENTIAL/PLATELET
Basophils Absolute: 0.1 10*3/uL (ref 0.0–0.2)
Basos: 1 %
EOS (ABSOLUTE): 0.1 10*3/uL (ref 0.0–0.4)
Eos: 2 %
Hematocrit: 30.1 % — ABNORMAL LOW (ref 34.0–46.6)
Hemoglobin: 9.3 g/dL — ABNORMAL LOW (ref 11.1–15.9)
Immature Grans (Abs): 0 10*3/uL (ref 0.0–0.1)
Immature Granulocytes: 0 %
Lymphocytes Absolute: 2.1 10*3/uL (ref 0.7–3.1)
Lymphs: 34 %
MCH: 24.7 pg — ABNORMAL LOW (ref 26.6–33.0)
MCHC: 30.9 g/dL — ABNORMAL LOW (ref 31.5–35.7)
MCV: 80 fL (ref 79–97)
Monocytes Absolute: 0.6 10*3/uL (ref 0.1–0.9)
Monocytes: 9 %
Neutrophils Absolute: 3.4 10*3/uL (ref 1.4–7.0)
Neutrophils: 54 %
Platelets: 337 10*3/uL (ref 150–450)
RBC: 3.77 x10E6/uL (ref 3.77–5.28)
RDW: 16.6 % — ABNORMAL HIGH (ref 11.7–15.4)
WBC: 6.3 10*3/uL (ref 3.4–10.8)

## 2023-11-30 ENCOUNTER — Telehealth: Payer: Self-pay

## 2023-11-30 NOTE — Telephone Encounter (Signed)
 Hgb on recheck yesterday was 9.4 and the patient reports no active/obvious bleeding.  Confirmed procedure date of 12/02/2023. Confirmed arrival time of 0900 for procedure time at 1130. Reviewed pre-procedure instructions with patient. Confirmed she has no contrast allergy and no PPM/defibrillator. She understands to call if questions/concerns arise prior to procedure.  She was grateful for call and agreed with plan.

## 2023-12-02 ENCOUNTER — Inpatient Hospital Stay (HOSPITAL_COMMUNITY)
Admission: RE | Admit: 2023-12-02 | Discharge: 2023-12-02 | DRG: 274 | Disposition: A | Discharge status: Home / Self Care | Attending: Cardiology | Admitting: Cardiology

## 2023-12-02 ENCOUNTER — Inpatient Hospital Stay (HOSPITAL_COMMUNITY)

## 2023-12-02 ENCOUNTER — Inpatient Hospital Stay (HOSPITAL_COMMUNITY): Admitting: Anesthesiology

## 2023-12-02 ENCOUNTER — Encounter (HOSPITAL_COMMUNITY): Payer: Self-pay | Admitting: Cardiology

## 2023-12-02 ENCOUNTER — Encounter (HOSPITAL_COMMUNITY)
Admission: RE | Disposition: A | Discharge status: Home / Self Care | Payer: Self-pay | Source: Home / Self Care | Attending: Cardiology

## 2023-12-02 ENCOUNTER — Other Ambulatory Visit: Payer: Self-pay

## 2023-12-02 DIAGNOSIS — D649 Anemia, unspecified: Secondary | ICD-10-CM | POA: Diagnosis not present

## 2023-12-02 DIAGNOSIS — Z881 Allergy status to other antibiotic agents status: Secondary | ICD-10-CM | POA: Diagnosis not present

## 2023-12-02 DIAGNOSIS — I2510 Atherosclerotic heart disease of native coronary artery without angina pectoris: Secondary | ICD-10-CM | POA: Diagnosis not present

## 2023-12-02 DIAGNOSIS — Z880 Allergy status to penicillin: Secondary | ICD-10-CM

## 2023-12-02 DIAGNOSIS — Z955 Presence of coronary angioplasty implant and graft: Secondary | ICD-10-CM

## 2023-12-02 DIAGNOSIS — Z006 Encounter for examination for normal comparison and control in clinical research program: Secondary | ICD-10-CM | POA: Diagnosis not present

## 2023-12-02 DIAGNOSIS — Z7901 Long term (current) use of anticoagulants: Secondary | ICD-10-CM | POA: Diagnosis not present

## 2023-12-02 DIAGNOSIS — K219 Gastro-esophageal reflux disease without esophagitis: Secondary | ICD-10-CM | POA: Diagnosis present

## 2023-12-02 DIAGNOSIS — E785 Hyperlipidemia, unspecified: Secondary | ICD-10-CM | POA: Diagnosis present

## 2023-12-02 DIAGNOSIS — F32A Depression, unspecified: Secondary | ICD-10-CM | POA: Diagnosis present

## 2023-12-02 DIAGNOSIS — Z87891 Personal history of nicotine dependence: Secondary | ICD-10-CM | POA: Diagnosis not present

## 2023-12-02 DIAGNOSIS — I4891 Unspecified atrial fibrillation: Secondary | ICD-10-CM | POA: Diagnosis not present

## 2023-12-02 DIAGNOSIS — F419 Anxiety disorder, unspecified: Secondary | ICD-10-CM | POA: Diagnosis present

## 2023-12-02 DIAGNOSIS — Z7982 Long term (current) use of aspirin: Secondary | ICD-10-CM | POA: Diagnosis not present

## 2023-12-02 DIAGNOSIS — I480 Paroxysmal atrial fibrillation: Secondary | ICD-10-CM

## 2023-12-02 DIAGNOSIS — N189 Chronic kidney disease, unspecified: Secondary | ICD-10-CM | POA: Diagnosis not present

## 2023-12-02 DIAGNOSIS — Z885 Allergy status to narcotic agent status: Secondary | ICD-10-CM | POA: Diagnosis not present

## 2023-12-02 DIAGNOSIS — I25119 Atherosclerotic heart disease of native coronary artery with unspecified angina pectoris: Secondary | ICD-10-CM

## 2023-12-02 DIAGNOSIS — Z79899 Other long term (current) drug therapy: Secondary | ICD-10-CM | POA: Diagnosis not present

## 2023-12-02 DIAGNOSIS — Z7902 Long term (current) use of antithrombotics/antiplatelets: Secondary | ICD-10-CM | POA: Diagnosis not present

## 2023-12-02 DIAGNOSIS — Z01818 Encounter for other preprocedural examination: Secondary | ICD-10-CM | POA: Diagnosis not present

## 2023-12-02 DIAGNOSIS — Z888 Allergy status to other drugs, medicaments and biological substances status: Secondary | ICD-10-CM

## 2023-12-02 DIAGNOSIS — I252 Old myocardial infarction: Secondary | ICD-10-CM

## 2023-12-02 DIAGNOSIS — Z8674 Personal history of sudden cardiac arrest: Secondary | ICD-10-CM | POA: Diagnosis not present

## 2023-12-02 HISTORY — PX: LEFT ATRIAL APPENDAGE OCCLUSION: EP1229

## 2023-12-02 HISTORY — DX: Anemia, unspecified: D64.9

## 2023-12-02 HISTORY — PX: TRANSESOPHAGEAL ECHOCARDIOGRAM (CATH LAB): EP1270

## 2023-12-02 HISTORY — DX: Acute myocardial infarction, unspecified: I21.9

## 2023-12-02 LAB — ECHO TEE
AV Mean grad: 3 mmHg
AV Peak grad: 4.9 mmHg
Ao pk vel: 1.11 m/s

## 2023-12-02 LAB — TYPE AND SCREEN
ABO/RH(D): A NEG
Antibody Screen: NEGATIVE

## 2023-12-02 MED ORDER — SODIUM CHLORIDE 0.9 % IV SOLN: INTRAVENOUS | Status: DC

## 2023-12-02 MED ORDER — SUGAMMADEX SODIUM 200 MG/2ML IV SOLN
INTRAVENOUS | Status: DC | PRN
  Administered 2023-12-02: 200 mg via INTRAVENOUS

## 2023-12-02 MED ORDER — SODIUM CHLORIDE 0.9 % IV SOLN: 250.0000 mL | INTRAVENOUS | Status: DC | PRN

## 2023-12-02 MED ORDER — CHLORHEXIDINE GLUCONATE 0.12 % MT SOLN: 15.0000 mL | Freq: Once | OROMUCOSAL | Status: AC

## 2023-12-02 MED ORDER — HEPARIN (PORCINE) IN NACL 2000-0.9 UNIT/L-% IV SOLN
INTRAVENOUS | Status: DC | PRN
  Administered 2023-12-02: 1000 mL

## 2023-12-02 MED ORDER — VANCOMYCIN HCL IN DEXTROSE 1-5 GM/200ML-% IV SOLN
1000.0000 mg | INTRAVENOUS | Status: AC
  Administered 2023-12-02: 1000 mg via INTRAVENOUS
  Filled 2023-12-02: qty 200

## 2023-12-02 MED ORDER — APIXABAN 5 MG PO TABS
5.0000 mg | ORAL_TABLET | Freq: Two times a day (BID) | ORAL | Status: DC
  Administered 2023-12-02: 5 mg via ORAL
  Filled 2023-12-02: qty 1

## 2023-12-02 MED ORDER — ACETAMINOPHEN 325 MG PO TABS
650.0000 mg | ORAL_TABLET | ORAL | Status: DC | PRN
  Administered 2023-12-02: 650 mg via ORAL

## 2023-12-02 MED ORDER — ROCURONIUM BROMIDE 10 MG/ML (PF) SYRINGE
PREFILLED_SYRINGE | INTRAVENOUS | Status: DC | PRN
  Administered 2023-12-02: 60 mg via INTRAVENOUS

## 2023-12-02 MED ORDER — PROTAMINE SULFATE 10 MG/ML IV SOLN
INTRAVENOUS | Status: DC | PRN
  Administered 2023-12-02: 30 mg via INTRAVENOUS

## 2023-12-02 MED ORDER — DEXAMETHASONE SODIUM PHOSPHATE 10 MG/ML IJ SOLN
INTRAMUSCULAR | Status: DC | PRN
  Administered 2023-12-02: 10 mg via INTRAVENOUS

## 2023-12-02 MED ORDER — SODIUM CHLORIDE 0.9% FLUSH: 3.0000 mL | INTRAVENOUS | Status: DC | PRN

## 2023-12-02 MED ORDER — CHLORHEXIDINE GLUCONATE 0.12 % MT SOLN
OROMUCOSAL | Status: AC
  Administered 2023-12-02: 15 mL via OROMUCOSAL
  Filled 2023-12-02: qty 15

## 2023-12-02 MED ORDER — IOHEXOL 350 MG/ML SOLN
INTRAVENOUS | Status: DC | PRN
  Administered 2023-12-02: 8 mL
  Administered 2023-12-02: 10 mL

## 2023-12-02 MED ORDER — ONDANSETRON HCL 4 MG/2ML IJ SOLN: 4.0000 mg | Freq: Four times a day (QID) | INTRAMUSCULAR | Status: DC | PRN

## 2023-12-02 MED ORDER — ACETAMINOPHEN 325 MG PO TABS
ORAL_TABLET | ORAL | Status: AC
  Filled 2023-12-02: qty 2

## 2023-12-02 MED ORDER — ONDANSETRON HCL 4 MG/2ML IJ SOLN
INTRAMUSCULAR | Status: DC | PRN
  Administered 2023-12-02: 4 mg via INTRAVENOUS

## 2023-12-02 MED ORDER — HEPARIN SODIUM (PORCINE) 1000 UNIT/ML IJ SOLN
INTRAMUSCULAR | Status: DC | PRN
  Administered 2023-12-02: 11000 [IU] via INTRAVENOUS

## 2023-12-02 MED ORDER — SODIUM CHLORIDE 0.9% FLUSH: 3.0000 mL | Freq: Two times a day (BID) | INTRAVENOUS | Status: DC

## 2023-12-02 MED ORDER — PROPOFOL 10 MG/ML IV BOLUS
INTRAVENOUS | Status: DC | PRN
  Administered 2023-12-02: 120 mg via INTRAVENOUS

## 2023-12-02 MED ORDER — LIDOCAINE HCL (CARDIAC) PF 100 MG/5ML IV SOSY
PREFILLED_SYRINGE | INTRAVENOUS | Status: DC | PRN
  Administered 2023-12-02: 100 mg via INTRATRACHEAL

## 2023-12-02 MED ORDER — APIXABAN 5 MG PO TABS: 5.0000 mg | ORAL_TABLET | Freq: Two times a day (BID) | ORAL | Status: DC

## 2023-12-02 NOTE — Discharge Summary (Signed)
 Electrophysiology Discharge Summary   Patient ID: Sarah Pacheco,  MRN: [id number], DOB/AGE: 04-06-1946 78 y.o.  Admit date: 12/02/2023 Discharge date: 12/02/2023  Primary Care Physician: Patient, No Pcp Per  Primary Cardiologist: Sarah Swaziland, MD  Electrophysiologist: Sarah Byes, MD    Primary Discharge Diagnosis:  Paroxysmal Atrial Fibrillation Poor candidacy for long term anticoagulation due to preference to avoid long-term oral anticoagulation in the setting of life long need for DAPT  Secondary Discharge Diagnosis:  CAD Hx of STEMI complicated by VF arrest 2020 HLD GERD Anxiety depression  Procedures This Admission:  Transeptal Puncture Intra-procedural TEE which showed no LAA thrombus Left atrial appendage occlusive device placement on 12/02/23 by Dr. Marven Pacheco.  CONCLUSIONS:  1.Successful implantation of a WATCHMAN left atrial appendage occlusive device    2. TEE demonstrating no LAA thrombus 3. No early apparent complications.    Post Implant Anticoagulation Strategy: Continue Eliquis  5mg  by mouth twice daily for 45 days. After 45 days, stop Eliquis  and continue Aspirin  81mg  by mouth once daily and Plavix  75mg  by mouth once daily. Plan for CT scan 60 days after implant to assess appendage patency and watchman position.  Brief HPI: Sarah Pacheco is a 78 y.o. female with a history of Paroxysmal Atrial Fibrillation who was referred to Electrophysiology in the outpatient setting for consideration of LAAO   Hospital Course:  The patient was admitted and underwent left atrial appendage occlusive device placement as above.  The patient was monitored in the post procedure setting and has done very well with no concerns. Given this, he/she is being considered for same day discharge later today. Groin site has been stable without evidence of hematoma or bleeding. Wound care and restrictions were reviewed with the patient.   The patient has been scheduled for post  procedure follow up with EP APP in approximately 6 weeks. They will restart Eliquis  this evening and continue for 45 days then stop. At that time she will transition to Plavix  75mg  daily with ASA for recommended life long DAPT given her coronary hx. They will require dental SBE for 6 month post op and should refrain from dental work or cleanings for the first 45 days post implant. SBE to be RXd at follow up.   A repeat CT scan will be performed in approximately 60 days to ensure proper seal of the device.    Physical Exam: Vitals:   12/02/23 1415 12/02/23 1430 12/02/23 1500 12/02/23 1551  BP: 139/60 (!) 147/58 (!) 147/66 (!) 152/63  Pulse: (!) 58 61 62 65  Resp: 14 15 (!) 21 20  Temp:    97.9 F (36.6 C)  TempSrc:    Oral  SpO2: 93% 92% 93% 95%  Weight:      Height:        GEN: Well nourished, well developed in no acute distress NECK: No JVD; No carotid bruits CARDIAC: Regular rate and rhythm, no murmurs, rubs, gallops RESPIRATORY:   CTA b/l, wheezing or rhonchi  ABDOMEN: Soft, non-tender, non-distended EXTREMITIES:  No edema; No deformity. Groin site Stable     Discharge Medications:  Allergies as of 12/02/2023       Reactions   Atorvastatin Other (See Comments)   Cramps in legs   Codeine Nausea And Vomiting   Erythromycin Base Nausea Only   Penicillin G Hives   Rosuvastatin  Other (See Comments)   Muscle pain        Medication List     TAKE these medications  acetaminophen  500 MG tablet Commonly known as: TYLENOL  Take 1,000 mg by mouth every 6 (six) hours as needed for mild pain or headache.   B-12 2500 MCG Tabs Take 2,500 mcg by mouth daily.   clopidogrel  75 MG tablet Commonly known as: PLAVIX  Take 1 tablet (75 mg total) by mouth daily.   Eliquis  5 MG Tabs tablet Generic drug: apixaban  TAKE 1 TABLET(5 MG) BY MOUTH TWICE DAILY Notes to patient: Take your 12/02/24 morning dose early as we discussed ~ 4:00AM   metoprolol  succinate 50 MG 24 hr  tablet Commonly known as: Toprol  XL Take 1 tablet (50 mg total) by mouth daily. Take with or immediately following a meal.   nitroGLYCERIN  0.4 MG SL tablet Commonly known as: NITROSTAT  DISSOLVE 1 TABLET UNDER THE TONGUE EVERY 5 MINUTES AS NEEDED FOR CHEST PAIN FOR UP TO 3 DOSES. IF TAKING 3RD DOSE CALL 911.   pantoprazole  40 MG tablet Commonly known as: PROTONIX  Take 1 tablet (40 mg total) by mouth daily.   pyridOXINE 100 MG tablet Commonly known as: VITAMIN B6 Take 100 mg by mouth daily.   Repatha  SureClick 140 MG/ML Soaj Generic drug: Evolocumab  INJECT 1 DOSE( 140 MG) UNDER THE SKIN ONCE EVERY 14 DAYS   simvastatin  40 MG tablet Commonly known as: ZOCOR  TAKE 1 TABLET(40 MG) BY MOUTH DAILY   Vitamin D 50 MCG (2000 UT) tablet Take 4,000 Units by mouth daily.        Disposition:  Home with usual follow up as in AVS  Duration of Discharge Encounter:  APP Time: 10 minutes  Signed, Sarah Fails, PA-C  12/02/2023 5:08 PM

## 2023-12-02 NOTE — Progress Notes (Signed)
 Sarah Pacheco to be D/C'd Home per MD order.  Discussed with the patient and all questions fully answered.  VSS, Skin clean, dry and intact without evidence of skin break down, no evidence of skin tears noted. IV catheter discontinued intact. Site without signs and symptoms of complications. Dressing and pressure applied.  An After Visit Summary was printed and given to the patient. Patient received prescription.  D/c education completed with patient/family including follow up instructions, medication list, d/c activities limitations if indicated, with other d/c instructions as indicated by MD - patient able to verbalize understanding, all questions fully answered.   Patient instructed to return to ED, call 911, or call MD for any changes in condition.   Patient escorted via WC, and D/C home via private auto.  Sarah Pacheco 12/02/2023 6:59 PM

## 2023-12-02 NOTE — Transfer of Care (Signed)
 Immediate Anesthesia Transfer of Care Note  Patient: Sarah Pacheco  Procedure(s) Performed: LEFT ATRIAL APPENDAGE OCCLUSION TRANSESOPHAGEAL ECHOCARDIOGRAM  Patient Location: Cath Lab  Anesthesia Type:General  Level of Consciousness: awake, alert , and oriented  Airway & Oxygen Therapy: Patient Spontanous Breathing and Patient connected to nasal cannula oxygen  Post-op Assessment: Report given to RN and Post -op Vital signs reviewed and stable  Post vital signs: Reviewed and stable  Last Vitals:  Vitals Value Taken Time  BP 144/71 12/02/23 1315  Temp 36.5 C 12/02/23 1314  Pulse 59 12/02/23 1316  Resp 18 12/02/23 1316  SpO2 97 % 12/02/23 1316  Vitals shown include unfiled device data.  Last Pain:  Vitals:   12/02/23 1314  TempSrc: Temporal  PainSc:       Patients Stated Pain Goal: 0 (12/02/23 0902)  Complications: There were no known notable events for this encounter.

## 2023-12-02 NOTE — Anesthesia Postprocedure Evaluation (Signed)
 Anesthesia Post Note  Patient: Sarah Pacheco  Procedure(s) Performed: LEFT ATRIAL APPENDAGE OCCLUSION TRANSESOPHAGEAL ECHOCARDIOGRAM     Patient location during evaluation: PACU Anesthesia Type: General Level of consciousness: awake and alert Pain management: pain level controlled Vital Signs Assessment: post-procedure vital signs reviewed and stable Respiratory status: spontaneous breathing, nonlabored ventilation, respiratory function stable and patient connected to nasal cannula oxygen Cardiovascular status: blood pressure returned to baseline and stable Postop Assessment: no apparent nausea or vomiting Anesthetic complications: no   There were no known notable events for this encounter.  Last Vitals:  Vitals:   12/02/23 1340 12/02/23 1345  BP: 136/84 (!) 150/66  Pulse: 60 62  Resp: 14 15  Temp:  (!) 36.1 C  SpO2: 96% 95%    Last Pain:  Vitals:   12/02/23 1404  TempSrc:   PainSc: 3                  Leslye Rast

## 2023-12-02 NOTE — H&P (Signed)
 Electrophysiology Office Note:     Date:  12/02/2023    ID:  Sarah Pacheco, DOB January 20, 1946, MRN [id number]   [facility] Cardiologist:  Peter Swaziland, MD  [facility] Electrophysiologist:  Boyce Byes, MD    Referring MD: Swaziland, Peter M, MD    Chief Complaint: Atrial fibrillation   History of Present Illness:     Ms. Sarah Pacheco is a 78 year old woman who I am seeing today for an evaluation of atrial fibrillation at the request of Dr. Swaziland.  The patient last saw Dr. Swaziland September 20, 2023.  She has a history of myocardial infarction, coronary artery disease, hyperlipidemia, GERD, anemia, anxiety, depression, osteoarthritis.  In 2020 she had a inferior STEMI complicated by VF arrest.  She has had multiple layers of stents and is on lifelong DAPT.  She was diagnosed with atrial fibrillation while on vacation in July 2024.  She was started on Eliquis .  She has had multiple episodes of symptomatic atrial fibrillation resulting in ER visits.  She has chronic anemia and has required iron transfusions in the past.  Given the need for lifelong clopidogrel  use, watchman is being considered.  Plan for LAAO today. Procedure reviewed.       Objective Their past medical, social and family history was reviewed.     ROS:   Please see the history of present illness.    All other systems reviewed and are negative.   EKGs/Labs/Other Studies Reviewed:     The following studies were reviewed today:   April 05, 2023 echo EF 60-65 RV normal Trivial MR   March 15, 2023 [facility] records Rhythm strip demonstrating atrial fibrillation        Physical Exam:     VS:  BP 157/61   Pulse 59   Ht 5\' 3"  (1.6 m)   Wt 153 lb 12.8 oz (69.8 kg)   SpO2 98%   BMI 27.24 kg/m         Wt Readings from Last 3 Encounters:  09/23/23 153 lb 12.8 oz (69.8 kg)  09/20/23 154 lb (69.9 kg)  05/31/23 151 lb 6.4 oz (68.7 kg)      GEN: no distress CARD: RRR, No MRG RESP: No IWOB.  CTAB.         Assessment ASSESSMENT AND PLAN:     1. Paroxysmal atrial fibrillation (HCC)   2. Coronary artery disease involving native coronary artery of native heart without angina pectoris       #Atrial fibrillation Multiple presentations to hospital with symptomatic atrial fibrillation.  Rhythm control is indicated.  I discussed treatment options including antiarrhythmic drugs and catheter ablation.   Plan for LAAO only. Patient is not interested in ablation procedure for rhythm control.  -----------   I have seen Sarah Pacheco in the office today who is being considered for a Watchman left atrial appendage closure device. I believe they will benefit from this procedure given their history of atrial fibrillation, CHA2DS2-VASc score of 4 and unadjusted ischemic stroke rate of 4.8% per year. Unfortunately, the patient is not felt to be a long term anticoagulation candidate secondary to anemia and need for lifelong clopidogrel  therapy. The patient's chart has been reviewed and I feel that they would be a candidate for short term oral anticoagulation after Watchman implant.    It is my belief that after undergoing a LAA closure procedure, Sarah Pacheco will not need long term anticoagulation which eliminates anticoagulation side effects and major bleeding  risk.    Procedural risks for the Watchman implant have been reviewed with the patient including a 0.5% risk of stroke, <1% risk of perforation and <1% risk of device embolization. Other risks include bleeding, vascular damage, tamponade, worsening renal function, and death. The patient understands these risks.       The published clinical data on the safety and effectiveness of WATCHMAN include but are not limited to the following: - Holmes DR, Evalene Hilda, Sick P et al. for the PROTECT AF Investigators. Percutaneous closure of the left atrial appendage versus warfarin therapy for prevention of stroke in patients with atrial  fibrillation: a randomised non-inferiority trial. Lancet 2009; 374: 534-42. Evalene Hilda, Doshi SK, Deloria Fetch D et al. on behalf of the PROTECT AF Investigators. Percutaneous Left Atrial Appendage Closure for Stroke Prophylaxis in Patients With Atrial Fibrillation 2.3-Year Follow-up of the PROTECT AF (Watchman Left Atrial Appendage System for Embolic Protection in Patients With Atrial Fibrillation) Trial. Circulation 2013; 127:720-729. - Alli O, Doshi S,  Kar S, Reddy VY, Sievert H et al. Quality of Life Assessment in the Randomized PROTECT AF (Percutaneous Closure of the Left Atrial Appendage Versus Warfarin Therapy for Prevention of Stroke in Patients With Atrial Fibrillation) Trial of Patients at Risk for Stroke With Nonvalvular Atrial Fibrillation. J Am Coll Cardiol 2013; 61:1790-8. Bartholome Ligas DR, Mario Sicard, Price M, Whisenant B, Sievert H, Doshi S, Huber K, Reddy V. Prospective randomized evaluation of the Watchman left atrial appendage Device in patients with atrial fibrillation versus long-term warfarin therapy; the PREVAIL trial. Journal of the [employer] of Cardiology, Vol. 4, No. 1, 2014, 1-11. - Kar S, Doshi SK, Sadhu A, Horton R, Osorio J et al. Primary outcome evaluation of a next-generation left atrial appendage closure device: results from the PINNACLE FLX trial. Circulation 2021;143(18)1754-1762.      After today's visit with the patient which was dedicated solely for shared decision making visit regarding LAA closure device, the patient decided to proceed with the LAA appendage closure procedure scheduled to be done in the near future at [facility]. Prior to the procedure, I would like to obtain a gated CT scan of the chest with contrast timed for PV/LA visualization.    Aaron Aas   HAS-BLED score 3 Hypertension No  Abnormal renal and liver function (Dialysis, transplant, Cr >2.26 mg/dL /Cirrhosis or Bilirubin >2x Normal or AST/ALT/AP >3x Normal) No  Stroke No  Bleeding Yes   Labile INR (Unstable/high INR) No  Elderly (>65) Yes  Drugs or alcohol (>= 8 drinks/week, anti-plt or NSAID) Yes    CHA2DS2-VASc Score = 4  The patient's score is based upon: CHF History: 0 HTN History: 0 Diabetes History: 0 Stroke History: 0 Vascular Disease History: 1 Age Score: 2 Gender Score: 1         Presents for LAAO today. Procedure reviewed.   Signed, Leanora Prophet. Marven Slimmer, MD, [facility], [facility] 12/02/2023 Electrophysiology [facility]

## 2023-12-02 NOTE — Discharge Instructions (Signed)
 [facility] Procedure, Care After  Procedure MD: Dr. Isidoro Donning Clinical Coordinator: Karsten Fells, RN  This sheet gives you information about how to care for yourself after your procedure. Your health care provider may also give you more specific instructions. If you have problems or questions, contact your health care provider.  What can I expect after the procedure? After the procedure, it is common to have: Bruising around your puncture site. Tenderness around your puncture site. Tiredness (fatigue).  Medication instructions It is very important to continue to take your blood thinner as directed by your doctor after the Watchman procedure. Call your procedure doctor's office with question or concerns. If you are on Coumadin (warfarin), you will have your INR checked the week after your procedure, with a goal INR of 2.0 - 3.0. Please follow your medication instructions on your discharge summary. Only take the medications listed on your discharge paperwork.  Follow up You will be seen in 6 weeks after your procedure You will have a repeat CT scan or Echocardiogram approximately 8 weeks after your procedure mark to check your device You will follow up the MD/APP who performed your procedure 6 months after your procedure The Watchman Clinical Coordinator will check in with you from time to time, including 1 and 2 years after your procedure.  NO DENTAL CLEANINGS FOR 45 days. After that, you will require antibiotics for dental procedures the first 6 months.   Follow these instructions at home: Puncture site care  Follow instructions from your health care provider about how to take care of your puncture site. Make sure you: If present, leave stitches (sutures), skin glue, or adhesive strips in place.  If a large square bandage is present, this may be removed 24 hours after surgery.  Check your puncture site every day for signs of infection. Check for: Redness, swelling, or pain. Fluid  or blood. If your puncture site starts to bleed, lie down on your back, apply firm pressure to the area, and contact your health care provider. Warmth. Pus or a bad smell. Driving Do not drive yourself home if you received sedation Do not drive for at least 4 days after your procedure or however long your health care provider recommends. (Do not resume driving if you have previously been instructed not to drive for other health reasons.) Do not spend greater than 1 hour at a time in a car for the first 3 days. Stop and take a break with a 5 minute walk at least every hour.  Do not drive or use heavy machinery while taking prescription pain medicine.  Activity Avoid activities that take a lot of effort, including exercise, for at least 7 days after your procedure. For the first 3 days, avoid sitting for longer than one hour at a time.  Avoid alcoholic beverages, signing paperwork, or participating in legal proceedings for 24 hours after receiving sedation Do not lift anything that is heavier than 10 lb (4.5 kg) for one week.  No sexual activity for 1 week.  Return to your normal activities as told by your health care provider. Ask your health care provider what activities are safe for you. General instructions Take over-the-counter and prescription medicines only as told by your health care provider. Do not use any products that contain nicotine or tobacco, such as cigarettes and e-cigarettes. If you need help quitting, ask your health care provider. You may shower after 24 hours, but Do not take baths, swim, or use a hot tub for  1 week.  Do not drink alcohol for 24 hours after your procedure. Keep all follow-up visits as told by your health care provider. This is important. Dental Work: You will require antibiotics prior to any dental work, including cleanings, for 6 months after your Watchman implantation to help protect you from infection. After 6 months, antibiotics are no longer  required. Contact a health care provider if: You have redness, mild swelling, or pain around your puncture site. You have soreness in your throat or at your puncture site that does not improve after several days You have fluid or blood coming from your puncture site that stops after applying firm pressure to the area. Your puncture site feels warm to the touch. You have pus or a bad smell coming from your puncture site. You have a fever. You have chest pain or discomfort that spreads to your neck, jaw, or arm. You are sweating a lot. You feel nauseous. You have a fast or irregular heartbeat. You have shortness of breath. You are dizzy or light-headed and feel the need to lie down. You have pain or numbness in the arm or leg closest to your puncture site. Get help right away if: Your puncture site suddenly swells. Your puncture site is bleeding and the bleeding does not stop after applying firm pressure to the area. These symptoms may represent a serious problem that is an emergency. Do not wait to see if the symptoms will go away. Get medical help right away. Call your local emergency services (911 in the [place] Do not drive yourself to the hospital. Summary After the procedure, it is normal to have bruising and tenderness at the puncture site in your groin, neck, or forearm. Check your puncture site every day for signs of infection. Get help right away if your puncture site is bleeding and the bleeding does not stop after applying firm pressure to the area. This is a medical emergency.  This information is not intended to replace advice given to you by your health care provider. Make sure you discuss any questions you have with your health care provider.

## 2023-12-02 NOTE — Anesthesia Procedure Notes (Signed)
 Procedure Name: Intubation Date/Time: 12/02/2023 12:14 PM  Performed by: Emmitt Harp, CRNAPre-anesthesia Checklist: Patient identified, Emergency Drugs available, Suction available and Patient being monitored Patient Re-evaluated:Patient Re-evaluated prior to induction Oxygen Delivery Method: Circle system utilized Preoxygenation: Pre-oxygenation with 100% oxygen Induction Type: IV induction Ventilation: Mask ventilation without difficulty Laryngoscope Size: Miller and 2 Grade View: Grade I Tube type: Oral Tube size: 7.0 mm Number of attempts: 1 Airway Equipment and Method: Stylet and Oral airway Placement Confirmation: ETT inserted through vocal cords under direct vision, positive ETCO2 and breath sounds checked- equal and bilateral Secured at: 22 cm Tube secured with: Tape Dental Injury: Teeth and Oropharynx as per pre-operative assessment

## 2023-12-02 NOTE — Anesthesia Preprocedure Evaluation (Addendum)
 Anesthesia Evaluation  Patient identified by MRN, date of birth, ID band Patient awake    Reviewed: Allergy & Precautions, NPO status , Patient's Chart, lab work & pertinent test results, reviewed documented beta blocker date and time   Airway Mallampati: III  TM Distance: >3 FB     Dental no notable dental hx.    Pulmonary former smoker   breath sounds clear to auscultation       Cardiovascular + angina  + CAD, + Past MI and + Cardiac Stents  (-) CABG + dysrhythmias Atrial Fibrillation (-) pacemaker Rhythm:Regular Rate:Normal  Normal TTE 08/24  CAD, multiple stents with prior VF arrest   Neuro/Psych  PSYCHIATRIC DISORDERS Anxiety Depression       GI/Hepatic ,GERD  ,,  Endo/Other    Renal/GU CRFRenal disease     Musculoskeletal  (+) Arthritis ,    Abdominal   Peds  Hematology  (+) Blood dyscrasia, anemia   Anesthesia Other Findings   Reproductive/Obstetrics                             Anesthesia Physical Anesthesia Plan  ASA: 3  Anesthesia Plan: General   Post-op Pain Management:    Induction: Intravenous  PONV Risk Score and Plan: 2 and Ondansetron  and Dexamethasone   Airway Management Planned: Oral ETT  Additional Equipment: TEE  Intra-op Plan:   Post-operative Plan: Extubation in OR  Informed Consent: I have reviewed the patients History and Physical, chart, labs and discussed the procedure including the risks, benefits and alternatives for the proposed anesthesia with the patient or authorized representative who has indicated his/her understanding and acceptance.     Dental advisory given  Plan Discussed with: CRNA  Anesthesia Plan Comments:         Anesthesia Quick Evaluation

## 2023-12-03 ENCOUNTER — Encounter (HOSPITAL_COMMUNITY): Payer: Self-pay | Admitting: Cardiology

## 2023-12-03 LAB — POCT ACTIVATED CLOTTING TIME: Activated Clotting Time: 325 s

## 2023-12-09 ENCOUNTER — Telehealth: Payer: Self-pay

## 2023-12-09 DIAGNOSIS — I480 Paroxysmal atrial fibrillation: Secondary | ICD-10-CM

## 2023-12-09 DIAGNOSIS — Z95818 Presence of other cardiac implants and grafts: Secondary | ICD-10-CM

## 2023-12-09 NOTE — Telephone Encounter (Signed)
  [facility]   Watchman Team  Contacted the patient regarding discharge from [facility] on 12/02/2023  The patient understands to follow up with Michaelle Adolphus on 01/18/2024  The patient understands discharge instructions? Yes  The patient understands medications and regimen? Yes   The patient reports groin site looks healthy with no S/S of infection or bleeding  The patient understands to call with any questions or concerns prior to scheduled visit.

## 2024-01-08 ENCOUNTER — Other Ambulatory Visit: Payer: Self-pay | Admitting: Cardiology

## 2024-01-10 NOTE — Telephone Encounter (Signed)
 Prescription refill request for Eliquis  received. Indication: PAF Last office visit: 09/20/23  P Swaziland MD Scr: 1.12 on 11/03/23  Epic Age: 78 Weight: 69.9kg  Based on above findings Eliquis  5mg  twice daily is the appropriate dose.  Refill approved.

## 2024-01-14 ENCOUNTER — Encounter (HOSPITAL_BASED_OUTPATIENT_CLINIC_OR_DEPARTMENT_OTHER): Payer: Self-pay

## 2024-01-18 ENCOUNTER — Ambulatory Visit: Attending: Student | Admitting: Student

## 2024-01-18 ENCOUNTER — Telehealth: Payer: Self-pay | Admitting: Student

## 2024-01-18 ENCOUNTER — Encounter: Payer: Self-pay | Admitting: Student

## 2024-01-18 VITALS — BP 118/60 | HR 65 | Ht 63.0 in | Wt 150.4 lb

## 2024-01-18 DIAGNOSIS — Z95818 Presence of other cardiac implants and grafts: Secondary | ICD-10-CM | POA: Diagnosis not present

## 2024-01-18 DIAGNOSIS — I2510 Atherosclerotic heart disease of native coronary artery without angina pectoris: Secondary | ICD-10-CM

## 2024-01-18 DIAGNOSIS — I480 Paroxysmal atrial fibrillation: Secondary | ICD-10-CM | POA: Diagnosis not present

## 2024-01-18 LAB — BASIC METABOLIC PANEL WITH GFR
BUN/Creatinine Ratio: 14 (ref 12–28)
BUN: 15 mg/dL (ref 8–27)
CO2: 20 mmol/L (ref 20–29)
Calcium: 9.5 mg/dL (ref 8.7–10.3)
Chloride: 105 mmol/L (ref 96–106)
Creatinine, Ser: 1.11 mg/dL — ABNORMAL HIGH (ref 0.57–1.00)
Glucose: 84 mg/dL (ref 70–99)
Potassium: 4.3 mmol/L (ref 3.5–5.2)
Sodium: 140 mmol/L (ref 134–144)
eGFR: 52 mL/min/{1.73_m2} — ABNORMAL LOW (ref >59)

## 2024-01-18 MED ORDER — AZITHROMYCIN 500 MG PO TABS: 500.0000 mg | ORAL_TABLET | Freq: Once | ORAL | 0 refills | Status: DC | PRN

## 2024-01-18 MED ORDER — ASPIRIN 81 MG PO TBEC: 81.0000 mg | DELAYED_RELEASE_TABLET | Freq: Every day | ORAL | Status: DC

## 2024-01-18 NOTE — Telephone Encounter (Signed)
 Spoke with patient and she states that the pharmacy already has this ready for her and she is in line now to pick it up. She does not mind taking it if she does have a dental visit as the side effects are not serious. She just wants to have it on hand in case she should need it. Pt will call us  if she has any further questions or concerns.

## 2024-01-18 NOTE — Progress Notes (Addendum)
  Electrophysiology Office Note:   Date:  01/18/2024  ID:  SHERRELL WEIR, DOB [birth year], MRN [id number]  Primary Cardiologist: Peter Swaziland, MD Electrophysiologist: Boyce Byes, MD      History of Present Illness:   Sarah Pacheco is a 78 y.o. female with h/o CAD, Paroxysmal AF, GERD, HLD, Anemia, anxiety, depression and OA seen today for routine electrophysiology follow-up s/p Watchman 12/02/23.  Since last being seen in our clinic the patient reports doing very well. Had to take 1 NTG when running up stairs, but otherwise has been without complaint. She denies palpitations, dyspnea, PND, orthopnea, nausea, vomiting, dizziness, syncope, edema, weight gain, or early satiety.    Review of systems complete and found to be negative unless listed in HPI.   EP Information / Studies Reviewed:    EKG is not ordered today. EKG from 12/02/2023 reviewed which showed NSR at 57 bpm       Arrhythmia/Device History No specialty comments available.   Physical Exam:   VS:  There were no vitals taken for this visit.   Wt Readings from Last 3 Encounters:  12/02/23 150 lb (68 kg)  09/23/23 153 lb 12.8 oz (69.8 kg)  09/20/23 154 lb (69.9 kg)     GEN: No acute distress NECK: No JVD; No carotid bruits CARDIAC: Regular rate and rhythm, no murmurs, rubs, gallops RESPIRATORY:  Clear to auscultation without rales, wheezing or rhonchi  ABDOMEN: Soft, non-tender, non-distended EXTREMITIES:  No edema; No deformity   ASSESSMENT AND PLAN:    Paroxysmal AF Presence of Watchman Now post op > 45 days Stop eliquis  Continue plavix  75 mg daily Start 81 mg daily ASA.  Dental prophylaxis sent (only needs for total 6 months post op)   CAD Stable angina No recent s/s of ischemia.     Continue current meds  Follow up with EP Team in October (Post op 6 months)  Signed, Tylene Galla, PA-C

## 2024-01-18 NOTE — Telephone Encounter (Signed)
 We reviewed during her visit that she only had nausea with erythromycin and this is a one time dose for dental prophylaxis.      We can use doxycycline if she would prefer to avoid erythromycin ; or as we discussed she can simply delay dental procedures until after 06/02/2024 (6 months post watchman)

## 2024-01-18 NOTE — Patient Instructions (Signed)
 Medication Instructions:  STOP Eliquis  START ASA 81 mg daily  CONTINUE plavix    *If you need a refill on your cardiac medications before your next appointment, please call your pharmacy*  Lab Work: BMET Today If you have labs (blood work) drawn today and your tests are completely normal, you will receive your results only by: MyChart Message (if you have MyChart) OR A paper copy in the mail If you have any lab test that is abnormal or we need to change your treatment, we will call you to review the results.  Testing/Procedures: CT scan 02/02/24  Follow-Up: At [facility], you and your health needs are our priority.  As part of our continuing mission to provide you with exceptional heart care, our providers are all part of one team.  This team includes your primary Cardiologist (physician) and Advanced Practice Providers or APPs (Physician Assistants and Nurse Practitioners) who all work together to provide you with the care you need, when you need it.  Your next appointment:   5 month(s)  Provider:   You may see Boyce Byes, MD or one of the following Advanced Practice Providers on your designated Care Team:   Mertha Abrahams, [place] Bambi Lever "Jonelle Neri" [place], PA-C Suzann Riddle, NP Creighton Doffing, NP    We recommend signing up for the patient portal called "MyChart".  Sign up information is provided on this After Visit Summary.  MyChart is used to connect with patients for Virtual Visits (Telemedicine).  Patients are able to view lab/test results, encounter notes, upcoming appointments, etc.  Non-urgent messages can be sent to your provider as well.   To learn more about what you can do with MyChart, go to [email or web address].

## 2024-01-18 NOTE — Telephone Encounter (Signed)
 Pt calling back stating: Patient is allergic to something similar Erythromycin- does this medicine still need to be refilled? Please address

## 2024-01-18 NOTE — Telephone Encounter (Signed)
 Pt c/o medication issue:  1. Name of Medication: Azithromycin that was sent over  2. How are you currently taking this medication (dosage and times per day)?   3. Are you having a reaction (difficulty breathing--STAT)?   4. What is your medication issue? Patient is allergic to something similar Erythromycin- does this medicine still need to be refilled?

## 2024-01-19 ENCOUNTER — Ambulatory Visit: Payer: Self-pay | Admitting: Student

## 2024-02-02 ENCOUNTER — Ambulatory Visit (HOSPITAL_COMMUNITY)
Admission: RE | Admit: 2024-02-02 | Discharge: 2024-02-02 | Disposition: A | Discharge status: Home / Self Care | Source: Ambulatory Visit | Attending: Cardiology | Admitting: Cardiology

## 2024-02-02 DIAGNOSIS — I700 Atherosclerosis of aorta: Secondary | ICD-10-CM | POA: Diagnosis not present

## 2024-02-02 DIAGNOSIS — Z95818 Presence of other cardiac implants and grafts: Secondary | ICD-10-CM | POA: Insufficient documentation

## 2024-02-02 DIAGNOSIS — I480 Paroxysmal atrial fibrillation: Secondary | ICD-10-CM | POA: Diagnosis not present

## 2024-02-02 MED ORDER — IOHEXOL 350 MG/ML SOLN
100.0000 mL | Freq: Once | INTRAVENOUS | Status: AC | PRN
  Administered 2024-02-02: 100 mL via INTRAVENOUS

## 2024-02-03 ENCOUNTER — Ambulatory Visit: Payer: Self-pay | Admitting: Cardiology

## 2024-02-21 ENCOUNTER — Encounter: Payer: Self-pay | Admitting: Cardiology

## 2024-02-21 ENCOUNTER — Encounter (HOSPITAL_BASED_OUTPATIENT_CLINIC_OR_DEPARTMENT_OTHER): Payer: Self-pay | Admitting: *Deleted

## 2024-02-21 ENCOUNTER — Telehealth: Payer: Self-pay

## 2024-02-21 NOTE — Telephone Encounter (Signed)
 Spoke to patient she stated she had a episode of fast heart beat after dinner this past Saturday night. She had severe right arm pain that lasted appox 2 hours.Stated she NTG x 5 with no relief.Stated she was out of town at her brother's birthday dinner.Stated arm pain was same kind of pain when she had heart attack.She did not go to ED.Stated she is presently driving, on her way back home.She feels ok today.No right arm pain.No fast heart beat.Dr.Jordan out of office this week.Appointment scheduled with Glendia Ferrier PA tomorrow 7/15 at 10:30 am.Advised if she has any more right arm pain to go to ED.

## 2024-02-21 NOTE — Progress Notes (Signed)
 "      OFFICE NOTE:    Date:  02/22/2024  ID:  Sarah Pacheco, DOB 1946-07-21, MRN [id number] PCP: Patient, No Pcp Per  [facility] Providers Cardiologist:  Peter Jordan, MD Electrophysiologist:  OLE ONEIDA HOLTS, MD        Coronary artery disease  Inf STEMI c/b VF arrest during cath and CG shock in 07/2019  s/p 2.75 x 38 mm DES and 2.75 x 26 mm DES to prox and mid RCA S/p scoring balloon angioplasty to RCA 2/2 in stent restenosis 05/2020 S/p scoring balloon angioplasty to RCA plus 3.5 x 38 mm DES and 3.5 x 28 mm DES to RCA (overlapping, covered old stents) 08/2020  LHC 05/26/22: RCA prox and mid stents patent, dist 20; EF 55-65 Requires indefinite DAPT Rx  Paroxysmal atrial fibrillation S/p LAAO 11/2023  Recurrent symptomatic  TTE 04/05/23: EF 60-65, no RWMA, NL RVSF, trivial MR TEE 12/02/23: EF 50-55, NL RVSF, mod LAD, no LAA clot, mod RAE, RV apex ? Pericardial cyst, mod MR, trivial AI Mod MR PACs/PVCs  Monitor 08/2021: < 1% Hyperlipidemia  GERD Anxiety and depresion  DJD  Anemia       Discussed the use of AI scribe software for clinical note transcription with the patient, who gave verbal consent to proceed. History of Present Illness Sarah Pacheco is a 78 y.o. female  She was referred to EP for recurrent symptomatic paroxysmal atrial fibrillation. She has a hx of anemia as well. She saw Dr. Holts in 09/2023 and PVI ablation plus LAAO was recommended. The pt opted for LAAO only. She had successful implant in 11/2023. She saw Jodie Passey, PA-C 01/18/24 and was taken off Eliquis  and started back in ASA in addition to Clopidogrel . She called in 02/21/24 with rapid palpitations w assoc severe R arm pain x 2 hours. Arm pain was her typical angina. She took NTG x 5 w/o relief. She was out of town and did not go to the ED. She is added on for further evaluation.   She experienced a recent episode of atrial fibrillation on Saturday, with a heart rate reaching 150 bpm and systolic  blood pressure of 160. The episode lasted approximately two hours and was accompanied by pain in her right arm, which is unusual for her atrial fibrillation episodes but similar to the pain experienced during her past heart attacks. She took five nitroglycerin  tablets and an extra dose of metoprolol , neither of which alleviated her symptoms. The episode resolved on its own. She has had three atrial fibrillation episodes since July, with the most recent one being different due to the arm pain and longer duration. Previous episodes involved chest pressure and a racing heart but not significant arm pain. Since Saturday, she has not experienced further symptoms of atrial fibrillation.  She describes chronic shortness of breath, particularly with exertion, which has persisted since her heart attack in December 2020. She also reports feeling lightheaded and 'fuzzy' upon standing, especially after sitting for a while, which has been occurring for some time.  She has not had syncope.   ROS-See HPI    Studies Reviewed:  EKG Interpretation Date/Time:  Tuesday February 22 2024 10:25:04 EDT Ventricular Rate:  54 PR Interval:  138 QRS Duration:  102 QT Interval:  434 QTC Calculation: 411 R Axis:   23  Text Interpretation: Sinus bradycardia Low voltage QRS Septal infarct No significant change since last tracing Confirmed by Lelon Hamilton [phone]) on 02/22/2024 11:01:01 AM  Results LABS LDL: 33 (05/31/2023)   Risk Assessment/Calculations:  CHA2DS2-VASc Score = 4   This indicates a 4.8% annual risk of stroke. The patient's score is based upon: CHF History: 0 HTN History: 0 Diabetes History: 0 Stroke History: 0 Vascular Disease History: 1 Age Score: 2 Gender Score: 1         Physical Exam:  VS:  BP 130/68   Pulse 62   Ht 5' 3 (1.6 m)   Wt 151 lb 6.4 oz (68.7 kg)   SpO2 97%   BMI 26.82 kg/m        Wt Readings from Last 3 Encounters:  02/22/24 151 lb 6.4 oz (68.7 kg)  01/18/24 150 lb 6.4  oz (68.2 kg)  12/02/23 150 lb (68 kg)    Constitutional:      Appearance: Healthy appearance. Not in distress.  Neck:     Vascular: JVD normal.  Pulmonary:     Breath sounds: Normal breath sounds. No wheezing. No rales.  Cardiovascular:     Normal rate. Regular rhythm.     Murmurs: There is no murmur.  Edema:    Peripheral edema absent.  Abdominal:     Palpations: Abdomen is soft.       Assessment and Plan:    Assessment & Plan Coronary artery disease involving native coronary artery of native heart with angina pectoris (HCC) Status post inferior STEMI in 2020 complicated by VF arrest and cardiogenic shock.  She was treated with DES x 2 to the RCA at that time.  She has had in-stent restenosis and underwent balloon angioplasty to the RCA in 2021.  She had balloon angioplasty and DES x 2 to the RCA in January 2022.  Cardiac catheterization in October 2023 with patent stents in the RCA.  She had a recent episode of atrial fibrillation with rapid rate and right arm discomfort, raising concern for ischemia. No exertional chest discomfort since the episode.  She has noted some lightheadedness with standing.  I checked her blood pressure sitting and standing today.  There was no significant drop.  Question of anemia may be driving some of her symptoms in the setting of rapid rate.  She had no acute changes on her EKG today.  She has been able to do normal activities since this episode without recurrent anginal symptoms.  I have suggested that we arrange stress testing to rule out significant inferior wall ischemia.  She has follow-up with Dr. Jordan next month. - Order Lexiscan  MPI to rule out significant ischemia. - Continue aspirin  81 mg daily. - Continue Plavix  75 mg daily. - Continue Repatha  140 mg every two weeks. - Continue nitroglycerin  as needed. - Keep follow-up in August as planned [employer] left atrial appendage closure device Paroxysmal atrial fibrillation (HCC) She is  status post left atrial appendage occlusion in April 2025.  Follow-up scan demonstrated well-seated device.  She opted to not proceed with ablation given infrequent episodes.  As noted, she had a recent episode of atrial fibrillation with rapid rate lasting about two hours, associated with right arm discomfort, which is unusual for her atrial fibrillation. No recurrent symptoms since the episode. EKG today shows sinus rhythm.  She was taken off of Eliquis  after her left atrial appendage occlusion procedure. - Continue metoprolol  succinate 50 mg daily - Follow up with electrophysiologist as planned. - If episodes become more frequent, will need to consider AAD Rx vs PVI ablation Hyperlipidemia LDL goal <70 LDL was 33 on October  21, which is optimal.  - Continue Repatha  140 mg every two weeks. - Continue simvastatin  40 mg daily. Anemia, unspecified type Obtain follow-up BMET, CBC today.  She is establishing with a new PCP.  I have asked her to follow-up with her new PCP for evaluation of her anemia.   Informed Consent   Shared Decision Making/Informed Consent The risks [chest pain, shortness of breath, cardiac arrhythmias, dizziness, blood pressure fluctuations, myocardial infarction, stroke/transient ischemic attack, nausea, vomiting, allergic reaction, radiation exposure, metallic taste sensation and life-threatening complications (estimated to be 1 in 10,000)], benefits (risk stratification, diagnosing coronary artery disease, treatment guidance) and alternatives of a nuclear stress test were discussed in detail with Ms. Seefeld and she agrees to proceed.     Dispo:  Return in 4 weeks (on 03/21/2024) for Scheduled Follow Up w/ Dr. Jordan.  Signed, Glendia Ferrier, PA-C   "

## 2024-02-22 ENCOUNTER — Ambulatory Visit: Attending: Cardiology | Admitting: Physician Assistant

## 2024-02-22 ENCOUNTER — Encounter (HOSPITAL_COMMUNITY): Payer: Self-pay | Admitting: *Deleted

## 2024-02-22 ENCOUNTER — Telehealth (HOSPITAL_COMMUNITY): Payer: Self-pay | Admitting: *Deleted

## 2024-02-22 ENCOUNTER — Encounter: Payer: Self-pay | Admitting: Physician Assistant

## 2024-02-22 VITALS — BP 130/68 | HR 62 | Ht 63.0 in | Wt 151.4 lb

## 2024-02-22 DIAGNOSIS — I10 Essential (primary) hypertension: Secondary | ICD-10-CM | POA: Diagnosis not present

## 2024-02-22 DIAGNOSIS — Z95818 Presence of other cardiac implants and grafts: Secondary | ICD-10-CM

## 2024-02-22 DIAGNOSIS — I480 Paroxysmal atrial fibrillation: Secondary | ICD-10-CM

## 2024-02-22 DIAGNOSIS — E785 Hyperlipidemia, unspecified: Secondary | ICD-10-CM

## 2024-02-22 DIAGNOSIS — I25119 Atherosclerotic heart disease of native coronary artery with unspecified angina pectoris: Secondary | ICD-10-CM | POA: Diagnosis not present

## 2024-02-22 DIAGNOSIS — D649 Anemia, unspecified: Secondary | ICD-10-CM

## 2024-02-22 NOTE — Telephone Encounter (Signed)
 Letter with instructions for upcoming stress test sent via [employer].  Argentina Bees, RN

## 2024-02-22 NOTE — Patient Instructions (Signed)
 Medication Instructions:  Your physician recommends that you continue on your current medications as directed. Please refer to the Current Medication list given to you today.  *If you need a refill on your cardiac medications before your next appointment, please call your pharmacy*  Lab Work: TODAY:  BMET & CBC  If you have labs (blood work) drawn today and your tests are completely normal, you will receive your results only by: MyChart Message (if you have MyChart) OR A paper copy in the mail If you have any lab test that is abnormal or we need to change your treatment, we will call you to review the results.  Testing/Procedures: Your physician has requested that you have a lexiscan myoview. For further information please visit [email or web address] Please follow instruction sheet, BELOW:    You are scheduled for a Myocardial Perfusion Imaging Study on Please arrive 15 minutes prior to your appointment time for registration and insurance purposes.  The test will take approximately 3 to 4 hours to complete; you may bring reading material.  If someone comes with you to your appointment, they will need to remain in the main lobby due to limited space in the testing area. **If you are pregnant or breastfeeding, please notify the nuclear lab prior to your appointment**  How to prepare for your Myocardial Perfusion Test: Do not eat or drink 3 hours prior to your test, except you may have water. Do not consume products containing caffeine (regular or decaffeinated) 12 hours prior to your test. (ex: coffee, chocolate, sodas, tea). Do bring a list of your current medications with you.  If not listed below, you may take your medications as normal. Do not take metoprolol  (Lopressor , Toprol ) for 24 hours prior to the test.  Bring the medication to your appointment as you may be required to take it once the test is complete. Do wear comfortable clothes (no dresses or overalls) and walking shoes,  tennis shoes preferred (No heels or open toe shoes are allowed). Do NOT wear cologne, perfume, aftershave, or lotions (deodorant is allowed). If these instructions are not followed, your test will have to be rescheduled.    Follow-Up: At [facility], you and your health needs are our priority.  As part of our continuing mission to provide you with exceptional heart care, our providers are all part of one team.  This team includes your primary Cardiologist (physician) and Advanced Practice Providers or APPs (Physician Assistants and Nurse Practitioners) who all work together to provide you with the care you need, when you need it.  Your next appointment:   AS SCHEDULED  Provider:   Peter [place], MD    We recommend signing up for the patient portal called MyChart.  Sign up information is provided on this After Visit Summary.  MyChart is used to connect with patients for Virtual Visits (Telemedicine).  Patients are able to view lab/test results, encounter notes, upcoming appointments, etc.  Non-urgent messages can be sent to your provider as well.   To learn more about what you can do with MyChart, go to [email or web address].   Other Instructions

## 2024-02-22 NOTE — Assessment & Plan Note (Signed)
 She is status post left atrial appendage occlusion in April 2025.  Follow-up scan demonstrated well-seated device.  She opted to not proceed with ablation given infrequent episodes.  As noted, she had a recent episode of atrial fibrillation with rapid rate lasting about two hours, associated with right arm discomfort, which is unusual for her atrial fibrillation. No recurrent symptoms since the episode. EKG today shows sinus rhythm.  She was taken off of Eliquis  after her left atrial appendage occlusion procedure. - Continue metoprolol  succinate 50 mg daily - Follow up with electrophysiologist as planned. - If episodes become more frequent, will need to consider AAD Rx vs PVI ablation

## 2024-02-23 ENCOUNTER — Other Ambulatory Visit: Payer: Self-pay | Admitting: Physician Assistant

## 2024-02-23 ENCOUNTER — Ambulatory Visit: Payer: Self-pay | Admitting: Physician Assistant

## 2024-02-23 DIAGNOSIS — I25119 Atherosclerotic heart disease of native coronary artery with unspecified angina pectoris: Secondary | ICD-10-CM

## 2024-02-23 LAB — CBC
Hematocrit: 32.6 % — ABNORMAL LOW (ref 34.0–46.6)
Hemoglobin: 9.5 g/dL — ABNORMAL LOW (ref 11.1–15.9)
MCH: 22.9 pg — ABNORMAL LOW (ref 26.6–33.0)
MCHC: 29.1 g/dL — ABNORMAL LOW (ref 31.5–35.7)
MCV: 79 fL (ref 79–97)
Platelets: 349 x10E3/uL (ref 150–450)
RBC: 4.15 x10E6/uL (ref 3.77–5.28)
RDW: 16.8 % — ABNORMAL HIGH (ref 11.7–15.4)
WBC: 7.8 x10E3/uL (ref 3.4–10.8)

## 2024-02-23 LAB — BASIC METABOLIC PANEL WITH GFR
BUN/Creatinine Ratio: 12 (ref 12–28)
BUN: 13 mg/dL (ref 8–27)
CO2: 18 mmol/L — ABNORMAL LOW (ref 20–29)
Calcium: 9.4 mg/dL (ref 8.7–10.3)
Chloride: 109 mmol/L — ABNORMAL HIGH (ref 96–106)
Creatinine, Ser: 1.05 mg/dL — ABNORMAL HIGH (ref 0.57–1.00)
Glucose: 81 mg/dL (ref 70–99)
Potassium: 5.1 mmol/L (ref 3.5–5.2)
Sodium: 142 mmol/L (ref 134–144)
eGFR: 55 mL/min/1.73 — ABNORMAL LOW (ref >59)

## 2024-03-01 ENCOUNTER — Ambulatory Visit (HOSPITAL_COMMUNITY)
Admission: RE | Admit: 2024-03-01 | Discharge: 2024-03-01 | Disposition: A | Discharge status: Home / Self Care | Source: Ambulatory Visit | Attending: Internal Medicine | Admitting: Internal Medicine

## 2024-03-01 DIAGNOSIS — I25119 Atherosclerotic heart disease of native coronary artery with unspecified angina pectoris: Secondary | ICD-10-CM | POA: Insufficient documentation

## 2024-03-01 LAB — MYOCARDIAL PERFUSION IMAGING
Base ST Depression (mm): 0 mm
LV dias vol: 71 mL (ref 46–106)
LV sys vol: 23 mL (ref 3.8–5.2)
Nuc Stress EF: 68 %
Peak HR: 85 {beats}/min
Rest HR: 58 {beats}/min
Rest Nuclear Isotope Dose: 10.5 mCi
SDS: 1
SRS: 0
SSS: 1
ST Depression (mm): 0 mm
Stress Nuclear Isotope Dose: 32.3 mCi
TID: 1.08

## 2024-03-01 MED ORDER — TECHNETIUM TC 99M TETROFOSMIN IV KIT
10.5000 | PACK | Freq: Once | INTRAVENOUS | Status: AC | PRN
  Administered 2024-03-01: 10.5 via INTRAVENOUS

## 2024-03-01 MED ORDER — REGADENOSON 0.4 MG/5ML IV SOLN
INTRAVENOUS | Status: AC
Start: 2024-03-01 — End: 2024-03-01
  Filled 2024-03-01: qty 5

## 2024-03-01 MED ORDER — REGADENOSON 0.4 MG/5ML IV SOLN
0.4000 mg | Freq: Once | INTRAVENOUS | Status: AC
  Administered 2024-03-01: 0.4 mg via INTRAVENOUS

## 2024-03-01 MED ORDER — TECHNETIUM TC 99M TETROFOSMIN IV KIT
32.3000 | PACK | Freq: Once | INTRAVENOUS | Status: AC | PRN
  Administered 2024-03-01: 32.3 via INTRAVENOUS

## 2024-03-02 ENCOUNTER — Ambulatory Visit: Payer: Self-pay | Admitting: Physician Assistant

## 2024-03-16 NOTE — Progress Notes (Signed)
 Cardiology Office Note:    Date:  03/21/2024   ID:  Sarah Pacheco, DOB 10/04/1945, MRN [id number]  PCP:  Patient, No Pcp Per   [facility] Providers Cardiologist:  Annelie Boak [place], MD Electrophysiologist:  OLE ONEIDA HOLTS, MD     Referring MD: No ref. provider found   Chief Complaint: Afib  History of Present Illness:    Sarah Pacheco is a 78 y.o. female with a hx of MI, CAD, hyperlipidemia, GERD, anemia, anxiety, depression, and osteoarthritis.   Prior inferior STEMI complicated by VF arrest (during cath) and cardiogenic shock 12/20 requiring DES x 2. Echo revealed nl LVEF without rwma. In October 2021, she began experiencing intermittent substernal chest discomfort notable when lying down and with activity.  Symptoms improved with sitting up and SL nitroglycerin .  ED on 06/01/2020 normal troponins x2, EKG showed subtle inferior lateral ST depression.  She was pain-free and was subsequently discharged. Unfortunately she continued to have intermittent chest heaviness associated with occasional diaphoresis and radiation to the shoulders and down both arms, symptoms reminiscent of angina prior to MI. Admitted and underwent LHC which revealed severe in-stent restenosis of the RCA treated with scoring balloon PTCA, LVEF 55 to 65%.  Due to in-stent restenosis she was referred to lipid clinic and started on PCSK9 inhibitor.    She presented again to the ED on 08/27/20 with worsening chest pain described as a burning pain that radiates down both arms, similar to pain prior to MI, but not as severe.  Her pain was relieved with 3 sublingual nitroglycerin . LHC revealed severe 2 site recurrent in-stent restenosis of the extensive proximal to distal RCA stents proximal to mid 95% ISR, distal stent 75% ISR.  She had successful scoring balloon and angioplasty for 2 additional overlapping stents covering the 2 old stents. No change in left coronary arteries, normal LV function and EDP. Lifelong DAPT was  recommended.   In Dec 2022 she complained of palpitations.  ZIO monitor reevealed NSR, rare PACs and PVCs (<1%), infrequent short runs of PAT, no a fib, intermittent bundle branch block. She was placed on metoprolol  up to 25 mg daily.   Earlier this year she was  on vacation with family in [place] . She developed sudden onset of tachycardia associated with chest pressure and feeling of smothering. She called EMS. Ecg showed Afib with RVR. She went to ED. Converted to NSR spontaneously. Troponin level 16 (range 0-14). Normal D dimer. CXR OK. CT chest with no PE. Mild anemia other labs OK. Her Toprol  dose was increased to 50 mg. She was started on Eliquis  5 mg bid. She denies any triggers for the Afib. None since. Echo done in August was normal.   She underwent placement of a Watchman LAO device in April  Seen last month with some atypical symptoms. Myoview  was done and was normal.   On follow up today she is doing well. No recurrent Afib.She is active and feels very well. She does note that if she sits for any length of time she feels off or fuzzy when she gets up.    Past Medical History:  Diagnosis Date   Anemia    Anxiety    Arthritis    CAD (coronary artery disease)    a. 07/2019 Inf STEMI/VF Arrest/CGS/PCI: LM nl, LADmin irregs, LCX nl, RCA 100p/m (2.75x38 & 2.75x26 Resolute Onyx DESs).   Cataract    Chest pain    Depression    GERD (gastroesophageal reflux disease)  History of echocardiogram    a. 07/2019 Echo: EF 60-65%, no rwma, nl RV size/fxn, mildly dil LA, mild MR.   Hyperlipidemia with target LDL less than 70 10/25/2015   Myocardial infarction [facility])     Past Surgical History:  Procedure Laterality Date   APPENDECTOMY     CATARACT EXTRACTION, BILATERAL     COLONOSCOPY  2017   X2     colon polyps   CORONARY BALLOON ANGIOPLASTY N/A 06/04/2020   Procedure: CORONARY BALLOON ANGIOPLASTY;  Surgeon: Dann Candyce RAMAN, MD;  Location: [facility] INVASIVE CV LAB;  Service:  Cardiovascular;  Laterality: N/A;   CORONARY STENT INTERVENTION N/A 08/28/2020   Procedure: CORONARY STENT INTERVENTION;  Surgeon: Anner Alm ORN, MD;  Location: [facility] INVASIVE CV LAB;  Service: Cardiovascular;  Laterality: N/A;   CORONARY ULTRASOUND/IVUS N/A 06/04/2020   Procedure: Intravascular Ultrasound/IVUS;  Surgeon: Dann Candyce RAMAN, MD;  Location: [facility] INVASIVE CV LAB;  Service: Cardiovascular;  Laterality: N/A;   CORONARY/GRAFT ACUTE MI REVASCULARIZATION N/A 07/11/2019   Procedure: CORONARY/GRAFT ACUTE MI REVASCULARIZATION;  Surgeon: [place], Rayland Hamed M, MD;  Location: [facility] INVASIVE CV LAB;  Service: Cardiovascular;  Laterality: N/A;   LAPAROTOMY N/A 10/10/2018   Procedure: EXPLORATORY LAPAROTOMY RIGHT COLECTOMY;  Surgeon: Ebbie Cough, MD;  Location: [facility] ORS;  Service: General;  Laterality: N/A;   LEFT ATRIAL APPENDAGE OCCLUSION N/A 12/02/2023   Procedure: LEFT ATRIAL APPENDAGE OCCLUSION;  Surgeon: Cindie Ole DASEN, MD;  Location: [facility] INVASIVE CV LAB;  Service: Cardiovascular;  Laterality: N/A;   LEFT HEART CATH AND CORONARY ANGIOGRAPHY N/A 07/11/2019   Procedure: LEFT HEART CATH AND CORONARY ANGIOGRAPHY;  Surgeon: [place], Beth Spackman M, MD;  Location: [facility] INVASIVE CV LAB;  Service: Cardiovascular;  Laterality: N/A;   LEFT HEART CATH AND CORONARY ANGIOGRAPHY N/A 06/04/2020   Procedure: LEFT HEART CATH AND CORONARY ANGIOGRAPHY;  Surgeon: Dann Candyce RAMAN, MD;  Location: [facility] INVASIVE CV LAB;  Service: Cardiovascular;  Laterality: N/A;   LEFT HEART CATH AND CORONARY ANGIOGRAPHY N/A 08/28/2020   Procedure: LEFT HEART CATH AND CORONARY ANGIOGRAPHY;  Surgeon: Anner Alm ORN, MD;  Location: [facility] INVASIVE CV LAB;  Service: Cardiovascular;  Laterality: N/A;   LEFT HEART CATH AND CORONARY ANGIOGRAPHY N/A 05/26/2022   Procedure: LEFT HEART CATH AND CORONARY ANGIOGRAPHY;  Surgeon: [place], Brahm Barbeau M, MD;  Location: [facility] INVASIVE CV LAB;  Service: Cardiovascular;  Laterality: N/A;   TEMPORARY PACEMAKER N/A  07/11/2019   Procedure: TEMPORARY PACEMAKER;  Surgeon: [place], Gustavia Carie M, MD;  Location: [facility] INVASIVE CV LAB;  Service: Cardiovascular;  Laterality: N/A;   TRANSESOPHAGEAL ECHOCARDIOGRAM (CATH LAB) N/A 12/02/2023   Procedure: TRANSESOPHAGEAL ECHOCARDIOGRAM;  Surgeon: Cindie Ole DASEN, MD;  Location: [facility] INVASIVE CV LAB;  Service: Cardiovascular;  Laterality: N/A;   VAGINAL HYSTERECTOMY  1973   AUB, fibroids    Current Medications: Allergies as of 03/21/2024       Reactions   Atorvastatin Other (See Comments)   Cramps in legs   Codeine Nausea And Vomiting   Erythromycin Base Nausea Only   Penicillin G Hives   Rosuvastatin  Other (See Comments)   Muscle pain        Medication List        Accurate as of March 21, 2024  9:11 AM. If you have any questions, ask your nurse or doctor.          STOP taking these medications    azithromycin  500 MG tablet Commonly known as: ZITHROMAX  Stopped by: Aubrianna Orchard [place]  TAKE these medications    acetaminophen  500 MG tablet Commonly known as: TYLENOL  Take 1,000 mg by mouth every 6 (six) hours as needed for mild pain or headache.   aspirin  EC 81 MG tablet Take 1 tablet (81 mg total) by mouth daily. Swallow whole.   B-12 2500 MCG Tabs Take 2,500 mcg by mouth daily.   clopidogrel  75 MG tablet Commonly known as: PLAVIX  Take 1 tablet (75 mg total) by mouth daily.   metoprolol  succinate 50 MG 24 hr tablet Commonly known as: Toprol  XL Take 0.5 tablets (25 mg total) by mouth daily. Take with or immediately following a meal. What changed: how much to take Changed by: Tiffanye Hartmann [place]   nitroGLYCERIN  0.4 MG SL tablet Commonly known as: NITROSTAT  DISSOLVE 1 TABLET UNDER THE TONGUE EVERY 5 MINUTES AS NEEDED FOR CHEST PAIN FOR UP TO 3 DOSES. IF TAKING 3RD DOSE CALL 911.   pantoprazole  40 MG tablet Commonly known as: PROTONIX  Take 1 tablet (40 mg total) by mouth daily.   pyridOXINE 100 MG tablet Commonly known as: VITAMIN B6 Take 100  mg by mouth daily.   Repatha  SureClick 140 MG/ML Soaj Generic drug: Evolocumab  INJECT 1 DOSE( 140 MG) UNDER THE SKIN ONCE EVERY 14 DAYS   simvastatin  40 MG tablet Commonly known as: ZOCOR  TAKE 1 TABLET(40 MG) BY MOUTH DAILY   Vitamin D 50 MCG (2000 UT) tablet Take 4,000 Units by mouth daily.        Allergies:   Atorvastatin, Codeine, Erythromycin base, Penicillin g, and Rosuvastatin    Social History   Socioeconomic History   Marital status: Divorced    Spouse name: Not on file   Number of children: Not on file   Years of education: Not on file   Highest education level: Not on file  Occupational History   Not on file  Tobacco Use   Smoking status: Former    Current packs/day: 0.50    Average packs/day: 0.5 packs/day for 15.0 years (7.5 ttl pk-yrs)    Types: Cigarettes   Smokeless tobacco: Never   Tobacco comments:    Quit around age 74  Vaping Use   Vaping status: Never Used  Substance and Sexual Activity   Alcohol use: No   Drug use: No   Sexual activity: Not Currently    Partners: Male    Comment: WIDOWED  Other Topics Concern   Not on file  Social History Narrative   Lives locally with husband.   Social Drivers of Corporate investment banker Strain: Not on file  Food Insecurity: Low Risk  (11/14/2022)   Received from [facility]   Hunger Vital Sign    Within the past 12 months, you worried that your food would run out before you got money to buy more: Never true    Within the past 12 months, the food you bought just didn't last and you didn't have money to get more. : Never true  Transportation Needs: No Transportation Needs (11/14/2022)   Received from [facility]    In the past 12 months, has lack of reliable transportation kept you from medical appointments, meetings, work or from getting things needed for daily living? : No  Physical Activity: Not on file  Stress: Not on file  Social Connections: Unknown (12/23/2021)   Received  from [facility]   Social Network    Social Network: Not on file     Family History: The patient's family history includes COPD  in her brother; Colon cancer (age of onset: 41) in her brother and sister; Colon polyps in her brother and sister; Heart attack in her sister; Heart attack (age of onset: 20) in her brother; Heart attack (age of onset: 16) in her father; Heart disease in her brother, father, sister, and sister; Heart failure in her brother; Lung cancer in her brother. There is no history of Esophageal cancer, Rectal cancer, or Stomach cancer.  ROS:   Please see the history of present illness.    +palpitations +dyspnea on exertion All other systems reviewed and are negative.  Labs/Other Studies Reviewed:    The following studies were reviewed today:  LHC 10/21  Prox RCA to Mid RCA lesion is 99% stenosed. Mid RCA lesion is 95% stenosed. Scoring balloon angioplasty was performed using a BALLOON WOLVERINE 3.00X10, followed by a 3.5 Applewold balloon, on both lesions. Post intervention, there is a 0% residual stenosis. The left ventricular systolic function is normal. LV end diastolic pressure is normal. The left ventricular ejection fraction is 55-65% by visual estimate. There is no aortic valve stenosis.   Continue aggressive secondary prevention.  Patient with some residual pain, likely related to some decreased flow in marginal branches.    LHC 1/22  The left ventricular systolic function is normal. The left ventricular ejection fraction is 50-55% by visual estimate. LV end diastolic pressure is low. -------------------- Proximal RCA stent is 95% stenosed, in-stent restenosis (subsequent) Mid-distal RCA stent is 75% stenosed.-In-stent restenosis (subsequent) Scoring balloon angioplasty was performed on both lesions using a BALLOON WOLVERINE 3.00X15. Noncompliant balloon angioplasty balloon angioplasty was performed using a 3.5 mm by 15 mm Dieterich balloon. A drug-eluting stent was  successfully placed open from the distal stent edge to the mid stented segment, using a SYNERGY XD 3.50X38. Postdilated to 3.8 mm A second drug-eluting stent was successfully placed overlapping the original stent and the new stent for the proximal segment, using a SYNERGY XD 3.50X28. Postdilated to 3.8 mm Post intervention, there is a 0% residual stenosis throughout the entire stented segment (the entire old stent segment was covered with new stents). -------------------------------------- Dist RCA lesion is 20% stenosed. Otherwise minimal LCA disease.   SUMMARY Severe 2 site recurrent in-stent restenosis of the extensive proximal to distal RCA stents -> proximal-mid 95% ISR, distal stent 75% ISR Successful Scoring Balloon followed by Panacea Balloon Angioplasty lesion preparation for 2 additional overlapping stents placed covering the 2 old stents using Synergy DES 3.5 mm x 38 and 3.5 mm by 28 mm stent postdilated to 3.8 mm. Relatively normal left coronary arteries-no change Normal LV function and EDP.     RECOMMENDATIONS Return to nursing unit for ongoing care.  Anticipate discharge in the morning. [facility] 1 reconsulted Continue aggressive risk modification Lifelong Thienopyridine antiplatelet agent.    Diagnostic Dominance: Right Intervention     Echo 12/20   Left Ventricle: Left ventricular ejection fraction, by visual estimation,  is 60 to 65%. The left ventricle has normal function. The left ventricular  internal cavity size was the left ventricle is normal in size. There is no  left ventricular hypertrophy.  Left ventricular diastolic parameters are indeterminate.  Right Ventricle: The right ventricular size is normal. No increase in  right ventricular wall thickness. Global RV systolic function is has  normal systolic function.  Left Atrium: Left atrial size was mildly dilated.  Right Atrium: Right atrial size was normal in size  Pericardium: There is no evidence of pericardial  effusion.  Mitral  Valve: The mitral valve is normal in structure. Mild mitral valve  regurgitation.  Tricuspid Valve: The tricuspid valve is normal in structure. Tricuspid  valve regurgitation is not demonstrated.  Aortic Valve: The aortic valve is tricuspid. Aortic valve regurgitation is  not visualized. The aortic valve is structurally normal, with no evidence  of sclerosis or stenosis.  Pulmonic Valve: The pulmonic valve was grossly normal. Pulmonic valve  regurgitation is trivial.  Aorta: The aortic root and ascending aorta are structurally normal, with  no evidence of dilitation.  Venous: The inferior vena cava is normal in size with greater than 50%  respiratory variability, suggesting right atrial pressure of 3 mmHg.  IAS/Shunts: The atrial septum is grossly normal.   Cardiac cath 05/26/22:  LEFT HEART CATH AND CORONARY ANGIOGRAPHY   Conclusion      Dist RCA lesion is 20% stenosed.   RV Branch lesion is 100% stenosed.   Non-stenotic Prox RCA lesion was previously treated.   Non-stenotic Mid RCA lesion was previously treated.   The left ventricular systolic function is normal.   LV end diastolic pressure is mildly elevated.   The left ventricular ejection fraction is 55-65% by visual estimate.   Nonobstructive CAD. Widely patent stents in the RCA Normal LV function Mildly elevated LVEDP   Plan: continue medical management  Coronary Diagrams  Diagnostic Dominance: Right  Intervention  Echo 04/05/23: IMPRESSIONS     1. Left ventricular ejection fraction, by estimation, is 60 to 65%. Left  ventricular ejection fraction by PLAX is 62 %. The left ventricle has  normal function. The left ventricle has no regional wall motion  abnormalities. Left ventricular diastolic  parameters were normal. The average left ventricular global longitudinal  strain is -22.5 %. The global longitudinal strain is normal.   2. Right ventricular systolic function is normal. The right  ventricular  size is normal. There is normal pulmonary artery systolic pressure. The  estimated right ventricular systolic pressure is 32.6 mmHg.   3. The mitral valve is abnormal. Trivial mitral valve regurgitation.   4. The aortic valve is tricuspid. Aortic valve regurgitation is not  visualized.   5. The inferior vena cava is normal in size with <50% respiratory  variability, suggesting right atrial pressure of 8 mmHg.   Comparison(s): Changes from prior study are noted. 07/12/2019: LVEF 60-65%.   Myoview  03/01/24:   The study is normal. The study is low risk.   No ST deviation was noted.   LV perfusion is normal. There is no evidence of ischemia. There is no evidence of infarction. (Prior inferior STEMI)   Left ventricular function is normal. Nuclear stress EF: 68%. The left ventricular ejection fraction is hyperdynamic (>65%). End diastolic cavity size is normal. End systolic cavity size is normal.   CT images were obtained for attenuation correction and were examined for the presence of coronary calcium  when appropriate.   Watchman noted.  Recent Labs: 05/31/2023: ALT 6 02/22/2024: BUN 13; Creatinine, Ser 1.05; Hemoglobin 9.5; Platelets 349; Potassium 5.1; Sodium 142  Recent Lipid Panel    Component Value Date/Time   CHOL 106 05/31/2023 1053   TRIG 95 05/31/2023 1053   HDL 55 05/31/2023 1053   CHOLHDL 1.9 05/31/2023 1053   CHOLHDL 1.4 08/27/2020 2213   VLDL 6 08/27/2020 2213   LDLCALC 33 05/31/2023 1053        Risk Assessment/Calculations:      Physical Exam:    VS:  BP (!) 142/70 (BP Location: Left Arm,  Patient Position: Sitting, Cuff Size: Normal)   Pulse 60   Ht 5' 3 (1.6 m)   Wt 150 lb (68 kg)   SpO2 98%   BMI 26.57 kg/m     Wt Readings from Last 3 Encounters:  03/21/24 150 lb (68 kg)  02/22/24 151 lb 6.4 oz (68.7 kg)  01/18/24 150 lb 6.4 oz (68.2 kg)     GEN:  Well nourished, well developed in no acute distress HEENT: Normal NECK: No JVD; No carotid  bruits CARDIAC: RRR, no murmurs, rubs, gallops RESPIRATORY:  Clear to auscultation without rales, wheezing or rhonchi  ABDOMEN: Soft, non-tender, non-distended MUSCULOSKELETAL:  No edema; No deformity.  SKIN: Warm and dry NEUROLOGIC:  Alert and oriented x 3 PSYCHIATRIC:  Normal affect     Diagnoses:    1. Paroxysmal atrial fibrillation (HCC)   2. Coronary artery disease involving native coronary artery of native heart with angina pectoris (HCC)   3. Hyperlipidemia LDL goal <70          Assessment and Plan:     CAD - excellent patency of RCA stents on last cath in October 2023.  Normal Myoview  this past month. Will plan on continuing Plavix  long term.   Continue Toprol  but reduce dose to 25 mg daily.  Paroxysmal Afib: ITALY vasc score of 4. Now s/p Watchman LAO. Will monitor for recurrent symptoms.  If more frequent recurrences consider AAD therapy vs ablation. Instructed to take Extra Toprol  prn.  Echo was OK.   Hyperlipidemia LDL goal < 70: LDL 33. Is managed by Dr. Mona. On Repatha .   Disposition: follow up in 6 months   Medication Adjustments/Labs and Tests Ordered: Current medicines are reviewed at length with the patient today.  Concerns regarding medicines are outlined above.  No orders of the defined types were placed in this encounter.  Meds ordered this encounter  Medications   clopidogrel  (PLAVIX ) 75 MG tablet    Sig: Take 1 tablet (75 mg total) by mouth daily.    Dispense:  90 tablet    Refill:  3   DISCONTD: metoprolol  succinate (TOPROL  XL) 50 MG 24 hr tablet    Sig: Take 1 tablet (50 mg total) by mouth daily. Take with or immediately following a meal.    Dispense:  90 tablet    Refill:  3   nitroGLYCERIN  (NITROSTAT ) 0.4 MG SL tablet    Sig: DISSOLVE 1 TABLET UNDER THE TONGUE EVERY 5 MINUTES AS NEEDED FOR CHEST PAIN FOR UP TO 3 DOSES. IF TAKING 3RD DOSE CALL 911.    Dispense:  25 tablet    Refill:  9   metoprolol  succinate (TOPROL  XL) 50 MG 24 hr  tablet    Sig: Take 0.5 tablets (25 mg total) by mouth daily. Take with or immediately following a meal.    Dispense:  90 tablet    Refill:  3    There are no Patient Instructions on file for this visit.   Signed, Gisel Vipond [place], MD  03/21/2024 9:11 AM    [facility]

## 2024-03-21 ENCOUNTER — Ambulatory Visit: Attending: Cardiology | Admitting: Cardiology

## 2024-03-21 ENCOUNTER — Encounter: Payer: Self-pay | Admitting: Cardiology

## 2024-03-21 VITALS — BP 142/70 | HR 60 | Ht 63.0 in | Wt 150.0 lb

## 2024-03-21 DIAGNOSIS — I25119 Atherosclerotic heart disease of native coronary artery with unspecified angina pectoris: Secondary | ICD-10-CM | POA: Diagnosis not present

## 2024-03-21 DIAGNOSIS — I480 Paroxysmal atrial fibrillation: Secondary | ICD-10-CM

## 2024-03-21 DIAGNOSIS — E785 Hyperlipidemia, unspecified: Secondary | ICD-10-CM | POA: Diagnosis not present

## 2024-03-21 MED ORDER — METOPROLOL SUCCINATE ER 50 MG PO TB24: 25.0000 mg | ORAL_TABLET | Freq: Every day | ORAL | 3 refills | Status: DC

## 2024-03-21 MED ORDER — CLOPIDOGREL BISULFATE 75 MG PO TABS: 75.0000 mg | ORAL_TABLET | Freq: Every day | ORAL | 3 refills | Status: AC

## 2024-03-21 MED ORDER — METOPROLOL SUCCINATE ER 50 MG PO TB24: 50.0000 mg | ORAL_TABLET | Freq: Every day | ORAL | 3 refills | Status: DC

## 2024-03-21 MED ORDER — NITROGLYCERIN 0.4 MG SL SUBL: SUBLINGUAL_TABLET | SUBLINGUAL | 9 refills | Status: AC

## 2024-03-21 NOTE — Patient Instructions (Signed)
 Medication Instructions:  Decrease Metoprolol to 25 mg daily Continue all other medications *If you need a refill on your cardiac medications before your next appointment, please call your pharmacy*  Lab Work: None ordered  Testing/Procedures: None ordered  Follow-Up: At [facility], you and your health needs are our priority.  As part of our continuing mission to provide you with exceptional heart care, our providers are all part of one team.  This team includes your primary Cardiologist (physician) and Advanced Practice Providers or APPs (Physician Assistants and Nurse Practitioners) who all work together to provide you with the care you need, when you need it.  Your next appointment:  6 months   Call in Oct to schedule Feb appointment     Provider:  Dr.Jordan   We recommend signing up for the patient portal called MyChart.  Sign up information is provided on this After Visit Summary.  MyChart is used to connect with patients for Virtual Visits (Telemedicine).  Patients are able to view lab/test results, encounter notes, upcoming appointments, etc.  Non-urgent messages can be sent to your provider as well.   To learn more about what you can do with MyChart, go to [email or web address].

## 2024-04-12 ENCOUNTER — Other Ambulatory Visit: Payer: Self-pay | Admitting: Cardiology

## 2024-05-01 DIAGNOSIS — F419 Anxiety disorder, unspecified: Secondary | ICD-10-CM | POA: Diagnosis not present

## 2024-05-01 DIAGNOSIS — K219 Gastro-esophageal reflux disease without esophagitis: Secondary | ICD-10-CM | POA: Diagnosis not present

## 2024-05-01 DIAGNOSIS — Z5181 Encounter for therapeutic drug level monitoring: Secondary | ICD-10-CM | POA: Diagnosis not present

## 2024-05-01 DIAGNOSIS — Z7189 Other specified counseling: Secondary | ICD-10-CM | POA: Diagnosis not present

## 2024-05-01 DIAGNOSIS — E7800 Pure hypercholesterolemia, unspecified: Secondary | ICD-10-CM | POA: Diagnosis not present

## 2024-05-01 DIAGNOSIS — Z1331 Encounter for screening for depression: Secondary | ICD-10-CM | POA: Diagnosis not present

## 2024-05-01 DIAGNOSIS — Z131 Encounter for screening for diabetes mellitus: Secondary | ICD-10-CM | POA: Diagnosis not present

## 2024-05-01 DIAGNOSIS — I2510 Atherosclerotic heart disease of native coronary artery without angina pectoris: Secondary | ICD-10-CM | POA: Diagnosis not present

## 2024-05-01 DIAGNOSIS — Z0130 Encounter for examination of blood pressure without abnormal findings: Secondary | ICD-10-CM | POA: Diagnosis not present

## 2024-05-01 DIAGNOSIS — D508 Other iron deficiency anemias: Secondary | ICD-10-CM | POA: Diagnosis not present

## 2024-05-01 DIAGNOSIS — I480 Paroxysmal atrial fibrillation: Secondary | ICD-10-CM | POA: Diagnosis not present

## 2024-05-29 ENCOUNTER — Telehealth: Payer: Self-pay

## 2024-05-29 ENCOUNTER — Other Ambulatory Visit: Payer: Self-pay | Admitting: Internal Medicine

## 2024-05-29 DIAGNOSIS — I2510 Atherosclerotic heart disease of native coronary artery without angina pectoris: Secondary | ICD-10-CM

## 2024-05-29 DIAGNOSIS — I214 Non-ST elevation (NSTEMI) myocardial infarction: Secondary | ICD-10-CM

## 2024-05-29 DIAGNOSIS — E785 Hyperlipidemia, unspecified: Secondary | ICD-10-CM

## 2024-05-29 NOTE — Telephone Encounter (Signed)
 Called to check in with patient, who had LAAO on 12/02/2023. The patient reports doing well with no issues.  She is currently on Plavix  and ASA 81 mg daily. Per Dr. Gib 03/21/2024 note, the patient is to remain on Plavix  long term.   Will route to Dr. [place] to see if OK to discontinue ASA and to continue Plavix  per patient request.

## 2024-05-30 NOTE — Telephone Encounter (Signed)
 Per Dr. [place], instructed patient to STOP ASA.  She was grateful for call and agreed with plan.

## 2024-05-30 NOTE — Addendum Note (Signed)
 Addended by: Kimanh Templeman A on: 05/30/2024 03:17 PM   Modules accepted: Orders

## 2024-06-01 ENCOUNTER — Other Ambulatory Visit: Payer: Self-pay | Admitting: Cardiology

## 2024-07-24 NOTE — Progress Notes (Unsigned)
°  Electrophysiology Office Follow up Visit Note:    Date:  07/25/2024   ID:  Sarah Pacheco, DOB 1946-01-18, MRN [id number]  PCP:  Patient, No Pcp Per  [facility] Cardiologist:  Peter Jordan, MD  [facility] Electrophysiologist:  OLE ONEIDA HOLTS, MD    Interval History:     Sarah Pacheco is a 78 y.o. female who presents for a follow up visit.   The patient was last seen by [facility] January 18, 2024.  The patient has a history of coronary artery disease, atrial fibrillation, anemia.  She had a left atrial appendage occlusion on December 02, 2023 with follow-up imaging demonstrating successful closure of the left atrial appendage.  Today she is doing well.  Reports very good control of her atrial fibrillation.  Is very pleased with the results following her watchman implant.      Past medical, surgical, social and family history were reviewed.  ROS:   Please see the history of present illness.    All other systems reviewed and are negative.  EKGs/Labs/Other Studies Reviewed:    The following studies were reviewed today:          Physical Exam:    VS:  BP 112/68   Pulse 67   Ht 5' 3 (1.6 m)   Wt 154 lb 12.8 oz (70.2 kg)   SpO2 94%   BMI 27.42 kg/m     Wt Readings from Last 3 Encounters:  07/25/24 154 lb 12.8 oz (70.2 kg)  03/21/24 150 lb (68 kg)  02/22/24 151 lb 6.4 oz (68.7 kg)     GEN: no distress CARD: RRR, No MRG RESP: No IWOB. CTAB.      ASSESSMENT:    1. Presence of Watchman left atrial appendage closure device   2. CAD S/P percutaneous coronary angioplasty   3. Paroxysmal atrial fibrillation (HCC)    PLAN:    In order of problems listed above:  #Atrial fibrillation #Post left atrial appendage occlusion/Watchman Continue Plavix  lifelong per interventional cardiology   #Coronary artery disease No ischemic symptoms  I discussed my upcoming departure from [facility] during today's clinic appointment.  The patient will continue to follow-up  with one of my EP partners moving forward.  Follow-up as needed with EP moving forward Follow-up as scheduled with Dr. Jordan   Signed, Ole Holts, MD, [facility], [facility] 07/25/2024 10:36 AM    Electrophysiology [facility]

## 2024-07-25 ENCOUNTER — Encounter: Payer: Self-pay | Admitting: Cardiology

## 2024-07-25 ENCOUNTER — Ambulatory Visit: Attending: Cardiology | Admitting: Cardiology

## 2024-07-25 VITALS — BP 112/68 | HR 67 | Ht 63.0 in | Wt 154.8 lb

## 2024-07-25 DIAGNOSIS — I480 Paroxysmal atrial fibrillation: Secondary | ICD-10-CM

## 2024-07-25 DIAGNOSIS — I2510 Atherosclerotic heart disease of native coronary artery without angina pectoris: Secondary | ICD-10-CM | POA: Diagnosis not present

## 2024-07-25 DIAGNOSIS — Z95818 Presence of other cardiac implants and grafts: Secondary | ICD-10-CM | POA: Diagnosis not present

## 2024-07-25 DIAGNOSIS — Z9861 Coronary angioplasty status: Secondary | ICD-10-CM | POA: Diagnosis not present

## 2024-07-25 NOTE — Patient Instructions (Signed)
 Medication Instructions:  Your physician recommends that you continue on your current medications as directed. Please refer to the Current Medication list given to you today.  *If you need a refill on your cardiac medications before your next appointment, please call your pharmacy*  Follow-Up: At [facility], you and your health needs are our priority.  As part of our continuing mission to provide you with exceptional heart care, our providers are all part of one team.  This team includes your primary Cardiologist (physician) and Advanced Practice Providers or APPs (Physician Assistants and Nurse Practitioners) who all work together to provide you with the care you need, when you need it.  Your next appointment:   As needed with EP

## 2024-10-02 ENCOUNTER — Ambulatory Visit: Admitting: Cardiology
